# Patient Record
Sex: Female | Born: 1970
Health system: Southern US, Community
[De-identification: ages and names within clinical notes are randomized; demographics above are authoritative.]

## PROBLEM LIST (undated history)

## (undated) DIAGNOSIS — F419 Anxiety disorder, unspecified: Secondary | ICD-10-CM

## (undated) DIAGNOSIS — R7303 Prediabetes: Secondary | ICD-10-CM

## (undated) DIAGNOSIS — G4730 Sleep apnea, unspecified: Secondary | ICD-10-CM

## (undated) DIAGNOSIS — K219 Gastro-esophageal reflux disease without esophagitis: Secondary | ICD-10-CM

## (undated) DIAGNOSIS — M2550 Pain in unspecified joint: Secondary | ICD-10-CM

## (undated) DIAGNOSIS — I10 Essential (primary) hypertension: Secondary | ICD-10-CM

## (undated) DIAGNOSIS — M1990 Unspecified osteoarthritis, unspecified site: Secondary | ICD-10-CM

## (undated) DIAGNOSIS — Z973 Presence of spectacles and contact lenses: Secondary | ICD-10-CM

## (undated) DIAGNOSIS — M549 Dorsalgia, unspecified: Secondary | ICD-10-CM

## (undated) DIAGNOSIS — K5900 Constipation, unspecified: Secondary | ICD-10-CM

## (undated) DIAGNOSIS — F32A Depression, unspecified: Secondary | ICD-10-CM

## (undated) DIAGNOSIS — E669 Obesity, unspecified: Secondary | ICD-10-CM

## (undated) DIAGNOSIS — K589 Irritable bowel syndrome without diarrhea: Secondary | ICD-10-CM

## (undated) DIAGNOSIS — R600 Localized edema: Secondary | ICD-10-CM

## (undated) DIAGNOSIS — F909 Attention-deficit hyperactivity disorder, unspecified type: Secondary | ICD-10-CM

## (undated) HISTORY — DX: Gastro-esophageal reflux disease without esophagitis: K21.9

## (undated) HISTORY — DX: Localized edema: R60.0

## (undated) HISTORY — DX: Irritable bowel syndrome, unspecified: K58.9

## (undated) HISTORY — DX: Anxiety disorder, unspecified: F41.9

## (undated) HISTORY — DX: Essential (primary) hypertension: I10

## (undated) HISTORY — DX: Obesity, unspecified: E66.9

## (undated) HISTORY — PX: OVARIAN CYST REMOVAL: SHX89

## (undated) HISTORY — DX: Pain in unspecified joint: M25.50

## (undated) HISTORY — DX: Attention-deficit hyperactivity disorder, unspecified type: F90.9

## (undated) HISTORY — DX: Dorsalgia, unspecified: M54.9

## (undated) HISTORY — DX: Prediabetes: R73.03

## (undated) HISTORY — DX: Constipation, unspecified: K59.00

---
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
## 1989-02-05 HISTORY — PX: OVARIAN CYST REMOVAL: SHX89

## 1998-05-19 ENCOUNTER — Other Ambulatory Visit: Admission: RE | Admit: 1998-05-19 | Discharge: 1998-05-19 | Payer: Self-pay | Admitting: Obstetrics & Gynecology

## 1998-06-23 ENCOUNTER — Other Ambulatory Visit: Admission: RE | Admit: 1998-06-23 | Discharge: 1998-06-23 | Payer: Self-pay | Admitting: *Deleted

## 2001-07-08 ENCOUNTER — Other Ambulatory Visit: Admission: RE | Admit: 2001-07-08 | Discharge: 2001-07-08 | Payer: Self-pay | Admitting: Obstetrics and Gynecology

## 2001-07-18 ENCOUNTER — Ambulatory Visit (HOSPITAL_COMMUNITY): Admission: RE | Admit: 2001-07-18 | Discharge: 2001-07-18 | Payer: Self-pay | Admitting: Obstetrics and Gynecology

## 2001-07-18 ENCOUNTER — Encounter: Payer: Self-pay | Admitting: Obstetrics and Gynecology

## 2003-07-22 ENCOUNTER — Other Ambulatory Visit: Admission: RE | Admit: 2003-07-22 | Discharge: 2003-07-22 | Payer: Self-pay | Admitting: Internal Medicine

## 2003-08-12 ENCOUNTER — Emergency Department (HOSPITAL_COMMUNITY): Admission: EM | Admit: 2003-08-12 | Discharge: 2003-08-12 | Payer: Self-pay | Admitting: Family Medicine

## 2006-02-05 HISTORY — PX: TUBAL LIGATION: SHX77

## 2007-07-11 ENCOUNTER — Ambulatory Visit (HOSPITAL_COMMUNITY): Admission: RE | Admit: 2007-07-11 | Discharge: 2007-07-11 | Payer: Self-pay | Admitting: Obstetrics and Gynecology

## 2007-07-20 ENCOUNTER — Encounter: Admission: RE | Admit: 2007-07-20 | Discharge: 2007-07-20 | Payer: Self-pay | Admitting: Obstetrics and Gynecology

## 2010-03-14 ENCOUNTER — Other Ambulatory Visit: Payer: Self-pay | Admitting: Internal Medicine

## 2010-03-14 DIAGNOSIS — R160 Hepatomegaly, not elsewhere classified: Secondary | ICD-10-CM

## 2010-03-17 ENCOUNTER — Ambulatory Visit
Admission: RE | Admit: 2010-03-17 | Discharge: 2010-03-17 | Disposition: A | Discharge status: Home / Self Care | Payer: 59 | Source: Ambulatory Visit | Attending: Internal Medicine | Admitting: Internal Medicine

## 2010-03-17 DIAGNOSIS — R160 Hepatomegaly, not elsewhere classified: Secondary | ICD-10-CM

## 2010-06-20 NOTE — Op Note (Signed)
NAME:  Rochon, [place]             ACCOUNT NO.:  [id number]   MEDICAL RECORD NO.:  [id number]          PATIENT TYPE:  AMB   LOCATION:  SDC                           FACILITY:  [facility]   PHYSICIAN:  Maxie Better, M.D.DATE OF BIRTH:  [birth year]   DATE OF PROCEDURE:  07/11/2007  DATE OF DISCHARGE:                               OPERATIVE REPORT   PREOPERATIVE DIAGNOSIS:  Desires sterilization.   POSTOPERATIVE DIAGNOSES:  1. Desires sterilization.  2. Liver mass.   PROCEDURE:  Laparoscopic tubal ligation with bipolar cautery.   ANESTHESIA:  General.   SURGEON:  Maxie Better, MD   ASSISTANT:  None.   PROCEDURE:  Under adequate general anesthesia, the patient was placed in  the dorsal lithotomy position.  She was sterilely prepped and draped in  usual fashion.  Bladder was catheterized for moderate amount of urine.  Examination under anesthesia revealed a slightly irregular, enlarged  uterus.  No adnexal masses could be appreciated.  The patient was then  turned to the abdomen.  Marcaine 0.25% was injected infraumbilical.  A  small infraumbilical vertical incision was then made.  A Veress needle  was introduced and to be adjusted after testing with normal saline.  Ultimately, the needle was placed and testing was appropriate.  Carbon  dioxide was then insufflated, opening pressure at that point was 3, and  2.5 L of CO2 was insufflated.  The Veress needle was then removed.  A  disposable 10-mm trocar was introduced in the abdomen without incident.  A lighted video laparoscope was then placed through that port.  On  inspection, the bowels were mildly hyperemic and distended.  There was  some bleeding noted, which appeared to have been coming from the  umbilical site and not on the organs of the abdomen.  Panoramic  inspection was then done and at that time, the gallbladder looked a  little bit distended.  The right liver lobe had a surface mass, unsure  whether or not this  was a hemangioma.  The pictures were taken.  The  appendix was noted to be normal elongated.  Uterus was appeared to be  bulky, irregular.  Both tubes and ovaries were normal.  The posterior  cul-de-sac without any evidence of any endometriosis.  A second incision  was then made suprapubically.  A 5-mm port was placed under direct  visualization, and both midportion of both fallopian tubes were then  cauterized.  When that was felt to be satisfactory, re-inspection of the  incision site infraumbilical was done including pulling back up on the  camera to look and see if there was any active bleeding, and there was  none noted.  The abdomen was then deflated.  The suprapubic site was  removed under direct visualization.  The infraumbilical site was then  removed under direct visualization and at that point, it was noted to be  there was a bleeder in the subcutaneous area, not actively filled, but  clearly that was where the bleeding had arisen.  The fascia was then  identified after the infraumbilical port was removed, and 0 Vicryl of  figure-of-eight  suture was then placed.  The subcutaneous area was  inspected and no bleeders noted.  The suprapubic site was closed with  Dermabond.  The  infraumbilical site was closed with subcuticular 4-0 Vicryl suture.  The  instruments that were in the vagina were removed.  Specimen was none.  Estimated blood loss was minimal.  Complication was none.  The patient  tolerated the procedure well, and was transferred to recovery in stable  condition.      Maxie Better, M.D.  Electronically Signed     [place]/MEDQ  D:  07/11/2007  T:  07/12/2007  Job:  [id number]

## 2010-08-08 ENCOUNTER — Inpatient Hospital Stay (INDEPENDENT_AMBULATORY_CARE_PROVIDER_SITE_OTHER)
Admission: RE | Admit: 2010-08-08 | Discharge: 2010-08-08 | Disposition: A | Discharge status: Home / Self Care | Payer: 59 | Source: Ambulatory Visit | Attending: Family Medicine | Admitting: Family Medicine

## 2010-08-08 DIAGNOSIS — T148XXA Other injury of unspecified body region, initial encounter: Secondary | ICD-10-CM

## 2010-08-17 ENCOUNTER — Inpatient Hospital Stay (HOSPITAL_COMMUNITY)
Admission: RE | Admit: 2010-08-17 | Discharge: 2010-08-17 | Disposition: A | Discharge status: Home / Self Care | Payer: 59 | Source: Ambulatory Visit | Attending: Family Medicine | Admitting: Family Medicine

## 2010-08-20 ENCOUNTER — Inpatient Hospital Stay (HOSPITAL_COMMUNITY)
Admission: RE | Admit: 2010-08-20 | Discharge: 2010-08-20 | Disposition: A | Discharge status: Home / Self Care | Payer: 59 | Source: Ambulatory Visit | Attending: Emergency Medicine | Admitting: Emergency Medicine

## 2010-11-02 LAB — CBC
HCT: 37.9
Hemoglobin: 13.1
MCHC: 34.4
MCV: 90.8
Platelets: 228
RBC: 4.18
RDW: 13.9
WBC: 4.8

## 2010-11-02 LAB — PREGNANCY, URINE: Preg Test, Ur: NEGATIVE

## 2010-12-25 ENCOUNTER — Encounter (HOSPITAL_COMMUNITY): Payer: Self-pay

## 2010-12-25 ENCOUNTER — Emergency Department (INDEPENDENT_AMBULATORY_CARE_PROVIDER_SITE_OTHER)
Admission: EM | Admit: 2010-12-25 | Discharge: 2010-12-25 | Disposition: A | Discharge status: Home / Self Care | Payer: 59 | Source: Home / Self Care

## 2010-12-25 DIAGNOSIS — J309 Allergic rhinitis, unspecified: Secondary | ICD-10-CM

## 2010-12-25 DIAGNOSIS — J302 Other seasonal allergic rhinitis: Secondary | ICD-10-CM

## 2010-12-25 DIAGNOSIS — J45909 Unspecified asthma, uncomplicated: Secondary | ICD-10-CM

## 2010-12-25 MED ORDER — FEXOFENADINE-PSEUDOEPHED ER 60-120 MG PO TB12: 1.0000 | ORAL_TABLET | Freq: Two times a day (BID) | ORAL | Status: AC

## 2010-12-25 MED ORDER — ALBUTEROL SULFATE HFA 108 (90 BASE) MCG/ACT IN AERS: 1.0000 | INHALATION_SPRAY | Freq: Four times a day (QID) | RESPIRATORY_TRACT | Status: DC | PRN

## 2010-12-25 MED ORDER — PREDNISONE 20 MG PO TABS: ORAL_TABLET | ORAL | Status: AC

## 2010-12-25 MED ORDER — HYDROCODONE-ACETAMINOPHEN 7.5-325 MG/15ML PO SOLN: 15.0000 mL | Freq: Three times a day (TID) | ORAL | Status: AC | PRN

## 2010-12-25 NOTE — ED Notes (Signed)
Started 3 weeks ago.  Has nonproductive cough.  Denies fevers.  Worse in evenings.  C/o chest pain from coughing so much. In addition had nosebleed Saturday.

## 2010-12-25 NOTE — ED Provider Notes (Signed)
History     CSN: [id number] Arrival date & time: 12/25/2010  8:57 AM   First MD Initiated Contact with Patient 12/25/10 [phone]      Chief Complaint  Patient presents with  . Cough    Started 3 weeks ago.  Has nonproductive cough.  Denies fevers.  Worse in evenings.  C/o chest pain from coughing so much.     (Consider location/radiation/quality/duration/timing/severity/associated sxs/prior treatment) Patient is a 40 y.o. female presenting with cough. The history is provided by the patient.  Cough This is a recurrent problem. The current episode started more than 1 week ago (3 weeks). The problem occurs constantly (worse at night). The problem has not changed since onset.The cough is non-productive. There has been no fever. Associated symptoms include rhinorrhea and wheezing. Pertinent negatives include no chest pain, no chills, no sweats, no weight loss, no ear congestion, no ear pain, no headaches, no sore throat, no myalgias and no eye redness. Associated symptoms comments: Nasal congestion and rhinorrhea. Upper central chest pain, wheezing and shortness of breath only during coughing spells.. She has tried cough syrup for the symptoms. The treatment provided mild relief. She is not a smoker. Her past medical history is significant for asthma. Her past medical history does not include COPD.    Past Medical History  Diagnosis Date  . Asthma     Past Surgical History  Procedure Date  . Cesarean section 1998  . Tubal ligation 2008    Family History  Problem Relation Age of Onset  . Asthma Other   . Heart failure Other   . Hypertension Other     History  Substance Use Topics  . Smoking status: Never Smoker   . Smokeless tobacco: Never Used  . Alcohol Use: 0.6 oz/week    1 Glasses of wine per week    OB History    Grav Para Term Preterm Abortions TAB SAB Ect Mult Living                  Review of Systems  Constitutional: Negative.  Negative for chills and weight loss.   HENT: Positive for nosebleeds, congestion, rhinorrhea, sneezing and postnasal drip. Negative for ear pain, sore throat and trouble swallowing.   Eyes: Negative for redness.  Respiratory: Positive for cough and wheezing.   Cardiovascular: Negative for chest pain, palpitations and leg swelling.  Musculoskeletal: Negative for myalgias.  Neurological: Negative for headaches.    Allergies  Review of patient's allergies indicates no known allergies.  Home Medications   Current Outpatient Rx  Name Route Sig Dispense Refill  . LISDEXAMFETAMINE DIMESYLATE 50 MG PO CAPS Oral Take 50 mg by mouth every morning.      . ALBUTEROL SULFATE HFA 108 (90 BASE) MCG/ACT IN AERS Inhalation Inhale 1-2 puffs into the lungs every 6 (six) hours as needed for wheezing. 1 Inhaler 0  . FEXOFENADINE-PSEUDOEPHEDRINE 60-120 MG PO TB12 Oral Take 1 tablet by mouth every 12 (twelve) hours. 30 tablet 0  . HYDROCODONE-ACETAMINOPHEN 7.5-325 MG/15ML PO SOLN Oral Take 15 mLs by mouth every 8 (eight) hours as needed for pain. 120 mL 0  . PREDNISONE 20 MG PO TABS  Take 2 tabs po daily for 5 days. 10 tablet no    BP 135/67  Pulse 60  Temp(Src) 98.4 F (36.9 C) (Oral)  Resp 16  SpO2 100%  LMP 12/17/2010  Physical Exam  Nursing note and vitals reviewed. Constitutional: She is oriented to person, place, and time. She appears  well-developed and well-nourished. No distress.  HENT:  Right Ear: External ear normal.  Left Ear: External ear normal.  Mouth/Throat: No oropharyngeal exudate.       Pharyngeal erythema no exudates. No uvula deviation. No trismus. Nasal congestion with significant redness and swelling of nasal turbinates. No active bleeding. Impress clear fluid behind right TM.  Eyes: Conjunctivae and EOM are normal. Pupils are equal, round, and reactive to light.  Cardiovascular: Normal heart sounds.        No low extremity edema  Pulmonary/Chest: Breath sounds normal. No respiratory distress. She has no  wheezes. She has no rales.  Lymphadenopathy:    She has no cervical adenopathy.  Neurological: She is alert and oriented to person, place, and time.  Skin: No rash noted.    ED Course  Procedures (including critical care time)  Labs Reviewed - No data to display No results found.   1. Seasonal allergies   2. Asthma       MDM  Likely seasonal allergies triggering asthma symptoms. Treated with prednisone, albuterol and antihistamine-decongestant.        Sharin Grave, MD 12/26/10 1424

## 2012-03-23 ENCOUNTER — Encounter (HOSPITAL_COMMUNITY): Payer: Self-pay | Admitting: *Deleted

## 2012-03-23 ENCOUNTER — Emergency Department (HOSPITAL_COMMUNITY)
Admission: EM | Admit: 2012-03-23 | Discharge: 2012-03-23 | Disposition: A | Discharge status: Home / Self Care | Payer: 59 | Source: Home / Self Care | Attending: Emergency Medicine | Admitting: Emergency Medicine

## 2012-03-23 DIAGNOSIS — N3000 Acute cystitis without hematuria: Secondary | ICD-10-CM

## 2012-03-23 LAB — POCT URINALYSIS DIP (DEVICE)
Glucose, UA: 100 mg/dL — AB
Ketones, ur: NEGATIVE mg/dL
Specific Gravity, Urine: 1.01 (ref 1.005–1.030)
Urobilinogen, UA: 0.2 mg/dL (ref 0.0–1.0)

## 2012-03-23 LAB — POCT PREGNANCY, URINE: Preg Test, Ur: NEGATIVE

## 2012-03-23 MED ORDER — CEPHALEXIN 500 MG PO CAPS: 500.0000 mg | ORAL_CAPSULE | Freq: Three times a day (TID) | ORAL | Status: DC

## 2012-03-23 NOTE — ED Notes (Signed)
Patient complains of urinary urgency, burning and itching, nausea x 1 day. Denies bleeding.

## 2012-03-23 NOTE — ED Provider Notes (Signed)
Chief Complaint  Patient presents with  . Urinary Tract Infection    History of Present Illness:   Pamela [name] is a 42 year old female who presents with a two-day history of frequency, urgency, dysuria, nausea, headache, and right and left lower back pain. She denies any fever, chills, vomiting, abdominal pain, pelvic pain, discharge, itching, odor. There's been no blood in the urine. She has had urinary tract infections before, most recently in October. She cannot think of anything that might of brought this on. She is drinking plenty of fluids, urinating regularly, and she does not think that sexual intercourse was a precipitating factor. She has tried some Azo-Standard and got a little bit symptomatic relief. Her last menstrual period was January 20. She has had a bilateral tubal ligation. She is sexually active. Patient denies allergies, medications, or other illness.  Review of Systems:  Other than noted above, the patient denies any of the following symptoms: General:  No fevers, chills, sweats, aches, or fatigue. GI:  No abdominal pain, back pain, nausea, vomiting, diarrhea, or constipation. GU:  No dysuria, frequency, urgency, hematuria, or incontinence. GYN:  No discharge, itching, vulvar pain or lesions, pelvic pain, or abnormal vaginal bleeding.  PMFSH:  Past medical history, family history, social history, meds, and allergies were reviewed.  Physical Exam:   Vital signs:  BP 141/87  Pulse 68  Temp(Src) 98.1 F (36.7 C) (Oral)  Resp 16  SpO2 96%  LMP 02/27/2012 Gen:  Alert, oriented, in no distress. Lungs:  Clear to auscultation, no wheezes, rales or rhonchi. Heart:  Regular rhythm, no gallop or murmer. Abdomen:  Flat and soft. There was slight suprapubic pain to palpation.  No guarding, or rebound.  No hepato-splenomegaly or mass.  Bowel sounds were normally active.  No hernia. Back:  No CVA tenderness.  Skin:  Clear, warm and dry.  Labs:   Results for orders placed  during the hospital encounter of 03/23/12  POCT URINALYSIS DIP (DEVICE)      Result Value Range   Glucose, UA 100 (*) NEGATIVE mg/dL   Bilirubin Urine NEGATIVE  NEGATIVE   Ketones, ur NEGATIVE  NEGATIVE mg/dL   Specific Gravity, Urine 1.010  1.005 - 1.030   Hgb urine dipstick SMALL (*) NEGATIVE   pH 5.5  5.0 - 8.0   Protein, ur NEGATIVE  NEGATIVE mg/dL   Urobilinogen, UA 0.2  0.0 - 1.0 mg/dL   Nitrite POSITIVE (*) NEGATIVE   Leukocytes, UA TRACE (*) NEGATIVE  POCT PREGNANCY, URINE      Result Value Range   Preg Test, Ur NEGATIVE  NEGATIVE     Other Labs Obtained at Urgent Care Center:  A urine culture was obtained.  Results are pending at this time and we will call about any positive results.  Assessment: The encounter diagnosis was Acute cystitis.   Plan:   1.  The following meds were prescribed:   Discharge Medication List as of 03/23/2012 12:06 PM    START taking these medications   Details  cephALEXin (KEFLEX) 500 MG capsule Take 1 capsule (500 mg total) by mouth 3 (three) times daily., Starting 03/23/2012, Until Discontinued, Normal       2.  The patient was instructed in symptomatic care and handouts were given. 3.  The patient was told to return if becoming worse in any way, if no better in 3 or 4 days, and given some red flag symptoms that would indicate earlier return. 4.  The patient was told  to avoid intercourse for 10 days, get extra fluids, and return for a follow up with her primary care doctor at the completion of treatment for a repeat UA and culture.     Reuben Likes, MD 03/23/12 [phone]

## 2012-03-25 LAB — URINE CULTURE: Colony Count: 70000

## 2012-03-27 NOTE — ED Notes (Signed)
Urine culture: 70,000 colonies E. Coli  Pt. adequately treated with keflex. Vassie Moselle 03/27/2012

## 2012-08-06 ENCOUNTER — Encounter (HOSPITAL_COMMUNITY): Payer: Self-pay | Admitting: Emergency Medicine

## 2012-08-06 ENCOUNTER — Emergency Department (HOSPITAL_COMMUNITY)
Admission: EM | Admit: 2012-08-06 | Discharge: 2012-08-06 | Disposition: A | Discharge status: Home / Self Care | Payer: 59 | Attending: Emergency Medicine | Admitting: Emergency Medicine

## 2012-08-06 DIAGNOSIS — J45909 Unspecified asthma, uncomplicated: Secondary | ICD-10-CM | POA: Insufficient documentation

## 2012-08-06 DIAGNOSIS — H579 Unspecified disorder of eye and adnexa: Secondary | ICD-10-CM

## 2012-08-06 DIAGNOSIS — H539 Unspecified visual disturbance: Secondary | ICD-10-CM | POA: Insufficient documentation

## 2012-08-06 MED ORDER — TETRACAINE HCL 0.5 % OP SOLN
2.0000 [drp] | Freq: Once | OPHTHALMIC | Status: AC
  Administered 2012-08-06: 2 [drp] via OPHTHALMIC
  Filled 2012-08-06: qty 2

## 2012-08-06 NOTE — ED Provider Notes (Signed)
History    CSN: [id number] Arrival date & time 08/06/12  6578  First MD Initiated Contact with Patient 08/06/12 [phone]     Chief Complaint  Patient presents with  . Blurred Vision   (Consider location/radiation/quality/duration/timing/severity/associated sxs/prior Treatment) HPI  Pamela [name] is a 42 y.o.female with a significant PMH of asthma presents to the ER with complaints of left eye vision appearing darker than the right eye. She noticed it when she woke up this morning. No injury, no loss of vision, no scratch, no drainage or pain. No sensitivity to light. The symptoms have resolved while she has been waiting in the waiting room and she no longer has any symptoms.   Past Medical History  Diagnosis Date  . Asthma    Past Surgical History  Procedure Laterality Date  . Cesarean section  1998  . Tubal ligation  2008   Family History  Problem Relation Age of Onset  . Asthma Other   . Heart failure Other   . Hypertension Other    History  Substance Use Topics  . Smoking status: Never Smoker   . Smokeless tobacco: Never Used  . Alcohol Use: 0.6 oz/week    1 Glasses of wine per week   OB History   Grav Para Term Preterm Abortions TAB SAB Ect Mult Living                 Review of Systems  Eyes: Positive for visual disturbance.  All other systems reviewed and are negative.    Allergies  Review of patient's allergies indicates no known allergies.  Home Medications   Current Outpatient Rx  Name  Route  Sig  Dispense  Refill  . EXPIRED: albuterol (PROVENTIL HFA;VENTOLIN HFA) 108 (90 BASE) MCG/ACT inhaler   Inhalation   Inhale 1-2 puffs into the lungs every 6 (six) hours as needed for wheezing.   1 Inhaler   0    BP 153/85  Pulse 54  Temp(Src) 98.2 F (36.8 C) (Oral)  Resp 18  SpO2 98%  LMP 07/29/2012 Physical Exam  Nursing note and vitals reviewed. Constitutional: She appears well-developed and well-nourished. No distress.  HENT:  Head:  Normocephalic and atraumatic.  Eyes: Conjunctivae, EOM and lids are normal. Pupils are equal, round, and reactive to light.  Fundoscopic exam:      The left eye shows no hemorrhage. The left eye shows no red reflex.  Slit lamp exam:      The left eye shows no corneal abrasion and no foreign body.  Pressures are symmetrical bilaterally  Neck: Normal range of motion. Neck supple.  Cardiovascular: Normal rate and regular rhythm.   Pulmonary/Chest: Effort normal.  Abdominal: Soft.  Neurological: She is alert.  Skin: Skin is warm and dry.    ED Course  Procedures (including critical care time) Labs Reviewed - No data to display No results found. 1. Left eye complaint     MDM    Patients symptoms have resolved. Unsure of what the etiology is of her symptoms from this morning. Discussed following up with Ophthalmology today. Gave referral and will have her call as soon as the office opens.  42 y.o.Pamela [name]'s evaluation in the Emergency Department is complete. It has been determined that no acute conditions requiring further emergency intervention are present at this time. The patient/guardian have been advised of the diagnosis and plan. We have discussed signs and symptoms that warrant return to the ED, such as changes or worsening  in symptoms.  Vital signs are stable at discharge. Filed Vitals:   08/06/12 0600  BP: 153/85  Pulse: 54  Temp:   Resp:     Patient/guardian has voiced understanding and agreed to follow-up with the PCP or specialist.   Dorthula Matas, PA-C 08/06/12 [phone]

## 2012-08-06 NOTE — ED Notes (Signed)
PT. REPORTS LEFT EYE BLURRED VISION THIS EVENING , DENIES PAIN OR INJURY / NO DRAINAGE .

## 2012-08-06 NOTE — ED Notes (Signed)
Patient complains of "shading in L eye.  I woke up this morning and couldn't see out of the eye, but then there was just some weird shading".  Mostly resolved at this time.  Patient is concerned for "detached retina".

## 2012-08-07 NOTE — ED Provider Notes (Signed)
Medical screening examination/treatment/procedure(s) were performed by non-physician practitioner and as supervising physician I was immediately available for consultation/collaboration.   Laray Anger, DO 08/07/12 2005

## 2013-07-21 ENCOUNTER — Other Ambulatory Visit: Payer: Self-pay | Admitting: Gastroenterology

## 2013-07-21 DIAGNOSIS — R9389 Abnormal findings on diagnostic imaging of other specified body structures: Secondary | ICD-10-CM

## 2013-07-22 ENCOUNTER — Other Ambulatory Visit: Payer: Self-pay | Admitting: Gastroenterology

## 2013-07-22 DIAGNOSIS — R935 Abnormal findings on diagnostic imaging of other abdominal regions, including retroperitoneum: Secondary | ICD-10-CM

## 2013-07-29 ENCOUNTER — Other Ambulatory Visit: Payer: 59

## 2013-07-30 ENCOUNTER — Ambulatory Visit
Admission: RE | Admit: 2013-07-30 | Discharge: 2013-07-30 | Disposition: A | Discharge status: Home / Self Care | Payer: 59 | Source: Ambulatory Visit | Attending: Gastroenterology | Admitting: Gastroenterology

## 2013-07-30 DIAGNOSIS — R935 Abnormal findings on diagnostic imaging of other abdominal regions, including retroperitoneum: Secondary | ICD-10-CM

## 2013-07-30 MED ORDER — GADOBENATE DIMEGLUMINE 529 MG/ML IV SOLN
20.0000 mL | Freq: Once | INTRAVENOUS | Status: AC | PRN
  Administered 2013-07-30: 20 mL via INTRAVENOUS

## 2015-05-20 ENCOUNTER — Ambulatory Visit (HOSPITAL_COMMUNITY)
Admission: EM | Admit: 2015-05-20 | Discharge: 2015-05-20 | Disposition: A | Discharge status: Home / Self Care | Payer: 59 | Attending: Emergency Medicine | Admitting: Emergency Medicine

## 2015-05-20 ENCOUNTER — Ambulatory Visit (HOSPITAL_COMMUNITY): Payer: 59

## 2015-05-20 ENCOUNTER — Encounter (HOSPITAL_COMMUNITY): Payer: Self-pay | Admitting: *Deleted

## 2015-05-20 DIAGNOSIS — R05 Cough: Secondary | ICD-10-CM

## 2015-05-20 DIAGNOSIS — J40 Bronchitis, not specified as acute or chronic: Secondary | ICD-10-CM

## 2015-05-20 DIAGNOSIS — R059 Cough, unspecified: Secondary | ICD-10-CM

## 2015-05-20 MED ORDER — IPRATROPIUM-ALBUTEROL 0.5-2.5 (3) MG/3ML IN SOLN
RESPIRATORY_TRACT | Status: AC
  Filled 2015-05-20: qty 3

## 2015-05-20 MED ORDER — PREDNISONE 10 MG (48) PO TBPK: ORAL_TABLET | Freq: Every day | ORAL | Status: DC

## 2015-05-20 MED ORDER — IPRATROPIUM-ALBUTEROL 0.5-2.5 (3) MG/3ML IN SOLN
3.0000 mL | Freq: Once | RESPIRATORY_TRACT | Status: AC
  Administered 2015-05-20: 3 mL via RESPIRATORY_TRACT

## 2015-05-20 MED ORDER — HYDROCODONE-HOMATROPINE 5-1.5 MG/5ML PO SYRP: 5.0000 mL | ORAL_SOLUTION | Freq: Four times a day (QID) | ORAL | Status: DC | PRN

## 2015-05-20 MED ORDER — ALBUTEROL SULFATE HFA 108 (90 BASE) MCG/ACT IN AERS: 2.0000 | INHALATION_SPRAY | Freq: Four times a day (QID) | RESPIRATORY_TRACT | Status: DC | PRN

## 2015-05-20 NOTE — Discharge Instructions (Signed)
You have bronchitis by chest xray which is viral. Use the prescription cough medication when you are not driving it can make you sleepy. Use Delsym during the day. Also Mucinex Max strength is very helpful with 8oz of water. Start the Prednisone in the morning, if you start tonight it might keep you awake. Use the injaler every 6 hours while awake x 2-3 days then prn. Should you worsen then please return here or er. Or ok to f/u with your PCP. Hope you feel better.  Upper Respiratory Infection, Adult Most upper respiratory infections (URIs) are caused by a virus. A URI affects the nose, throat, and upper air passages. The most common type of URI is often called "the common cold." HOME CARE   Take medicines only as told by your doctor.  Gargle warm saltwater or take cough drops to comfort your throat as told by your doctor.  Use a warm mist humidifier or inhale steam from a shower to increase air moisture. This may make it easier to breathe.  Drink enough fluid to keep your pee (urine) clear or pale yellow.  Eat soups and other clear broths.  Have a healthy diet.  Rest as needed.  Go back to work when your fever is gone or your doctor says it is okay.  You may need to stay home longer to avoid giving your URI to others.  You can also wear a face mask and wash your hands often to prevent spread of the virus.  Use your inhaler more if you have asthma.  Do not use any tobacco products, including cigarettes, chewing tobacco, or electronic cigarettes. If you need help quitting, ask your doctor. GET HELP IF:  You are getting worse, not better.  Your symptoms are not helped by medicine.  You have chills.  You are getting more short of breath.  You have brown or red mucus.  You have yellow or brown discharge from your nose.  You have pain in your face, especially when you bend forward.  You have a fever.  You have puffy (swollen) neck glands.  You have pain while  swallowing.  You have white areas in the back of your throat. GET HELP RIGHT AWAY IF:   You have very bad or constant:  Headache.  Ear pain.  Pain in your forehead, behind your eyes, and over your cheekbones (sinus pain).  Chest pain.  You have long-lasting (chronic) lung disease and any of the following:  Wheezing.  Long-lasting cough.  Coughing up blood.  A change in your usual mucus.  You have a stiff neck.  You have changes in your:  Vision.  Hearing.  Thinking.  Mood. MAKE SURE YOU:   Understand these instructions.  Will watch your condition.  Will get help right away if you are not doing well or get worse.   This information is not intended to replace advice given to you by your health care provider. Make sure you discuss any questions you have with your health care provider.   Document Released: 07/11/2007 Document Revised: 06/08/2014 Document Reviewed: 04/29/2013 Elsevier Interactive Patient Education [employer].

## 2015-05-20 NOTE — ED Notes (Signed)
PT   Reports   Of    Symptoms      Tightness  In     Chest          Shortness  Of breath              X  1  Week   She  Got  Better    And  Symptoms  Returned               she  Reports  She  Used  An inhaler that she had  On     Hand       From  A  Previous     Visit

## 2015-05-20 NOTE — ED Provider Notes (Signed)
CSN: [id number]     Arrival date & time 05/20/15  1807 History   First MD Initiated Contact with Patient 05/20/15 1838     Chief Complaint  Patient presents with  . Shortness of Breath   (Consider location/radiation/quality/duration/timing/severity/associated sxs/prior Treatment) HPI Comments: Pamela [name] is a very pleasant 45 yo female who presents with cough and dyspnea. She suffered from a respiratory infection about a week ago, she was feeling better at first then developed some SOB and "tightness" in her upper airway. She also has malaise and a dry cough. No fevers. No upper respiratory complaints.   Patient is a 45 y.o. female presenting with shortness of breath. The history is provided by the patient.  Shortness of Breath Associated symptoms: cough   Associated symptoms: no fever and no wheezing     Past Medical History  Diagnosis Date  . Asthma    Past Surgical History  Procedure Laterality Date  . Cesarean section  1998  . Tubal ligation  2008   Family History  Problem Relation Age of Onset  . Asthma Other   . Heart failure Other   . Hypertension Other    Social History  Substance Use Topics  . Smoking status: Never Smoker   . Smokeless tobacco: Never Used  . Alcohol Use: 0.6 oz/week    1 Glasses of wine per week   OB History    No data available     Review of Systems  Constitutional: Positive for fatigue. Negative for fever and chills.  HENT: Negative.  Negative for postnasal drip, rhinorrhea and sinus pressure.   Respiratory: Positive for cough and shortness of breath. Negative for wheezing.   Allergic/Immunologic: Negative for environmental allergies.       Allergy history as a child  Psychiatric/Behavioral: Negative.     Allergies  Review of patient's allergies indicates no known allergies.  Home Medications   Prior to Admission medications   Medication Sig Start Date End Date Taking? Authorizing Provider  albuterol (PROVENTIL HFA;VENTOLIN  HFA) 108 (90 Base) MCG/ACT inhaler Inhale 2 puffs into the lungs every 6 (six) hours as needed for wheezing or shortness of breath. 05/20/15   Bjorn Pippin, PA-C  HYDROcodone-homatropine (HYCODAN) 5-1.5 MG/5ML syrup Take 5 mLs by mouth every 6 (six) hours as needed for cough. 05/20/15   Bjorn Pippin, PA-C  predniSONE (STERAPRED UNI-PAK 48 TAB) 10 MG (48) TBPK tablet Take by mouth daily. Take daily as directed 05/20/15   Bjorn Pippin, PA-C   Meds Ordered and Administered this Visit   Medications  ipratropium-albuterol (DUONEB) 0.5-2.5 (3) MG/3ML nebulizer solution 3 mL (not administered)    BP 167/96 mmHg  Pulse 67  Temp(Src) 98.1 F (36.7 C) (Oral)  SpO2 99%  LMP 05/16/2015 No data found.   Physical Exam  Constitutional: She is oriented to person, place, and time. She appears well-developed and well-nourished. No distress.  HENT:  Head: Normocephalic and atraumatic.  Mouth/Throat: Oropharynx is clear and moist.  Neck: Normal range of motion.  Cardiovascular: Normal rate and regular rhythm.   Pulmonary/Chest: Effort normal. No respiratory distress. She has wheezes. She has no rales. She exhibits no tenderness.  Few wheeze in the right mid lung, mild course BS same region without crackles  Lymphadenopathy:    She has no cervical adenopathy.  Neurological: She is alert and oriented to person, place, and time.  Skin: Skin is warm and dry. She is not diaphoretic.  Psychiatric: Her behavior is normal.  Nursing note and vitals reviewed.   ED Course  Procedures (including critical care time)  Labs Review Labs Reviewed - No data to display  Imaging Review Dg Chest 2 View  05/20/2015  CLINICAL DATA:  Chest tightness and cough for the past week. EXAM: CHEST  2 VIEW COMPARISON:  None. FINDINGS: Normal sized heart. Clear lungs. Minimal central peribronchial thickening. Mild thoracic spine degenerative changes. IMPRESSION: Minimal bronchitic changes. Electronically Signed   By:  Claudie Revering M.D.   On: 05/20/2015 19:27     Visual Acuity Review  Right Eye Distance:   Left Eye Distance:   Bilateral Distance:    Right Eye Near:   Left Eye Near:    Bilateral Near:         MDM   1. Bronchitis   2. Cough    1. Treat with supportive care to include inhaler, prednisone pack and cough suppressant. Rest and fluids. No indication for an abx at this time. F/U if symptoms persist or worsen.     Bjorn Pippin, PA-C 05/20/15 1942

## 2016-03-12 DIAGNOSIS — K581 Irritable bowel syndrome with constipation: Secondary | ICD-10-CM | POA: Diagnosis not present

## 2016-03-12 DIAGNOSIS — R1084 Generalized abdominal pain: Secondary | ICD-10-CM | POA: Diagnosis not present

## 2016-03-12 DIAGNOSIS — K602 Anal fissure, unspecified: Secondary | ICD-10-CM | POA: Diagnosis not present

## 2016-03-31 DIAGNOSIS — J019 Acute sinusitis, unspecified: Secondary | ICD-10-CM | POA: Diagnosis not present

## 2016-04-12 DIAGNOSIS — K5904 Chronic idiopathic constipation: Secondary | ICD-10-CM | POA: Diagnosis not present

## 2016-04-12 DIAGNOSIS — K219 Gastro-esophageal reflux disease without esophagitis: Secondary | ICD-10-CM | POA: Diagnosis not present

## 2016-05-11 DIAGNOSIS — L03119 Cellulitis of unspecified part of limb: Secondary | ICD-10-CM | POA: Diagnosis not present

## 2016-05-23 DIAGNOSIS — L02416 Cutaneous abscess of left lower limb: Secondary | ICD-10-CM | POA: Diagnosis not present

## 2016-07-07 DIAGNOSIS — J0100 Acute maxillary sinusitis, unspecified: Secondary | ICD-10-CM | POA: Diagnosis not present

## 2016-08-23 ENCOUNTER — Ambulatory Visit (INDEPENDENT_AMBULATORY_CARE_PROVIDER_SITE_OTHER): Payer: 59 | Admitting: Allergy & Immunology

## 2016-08-23 ENCOUNTER — Encounter: Payer: Self-pay | Admitting: Allergy & Immunology

## 2016-08-23 VITALS — BP 130/78 | HR 70 | Temp 98.5°F | Resp 18 | Ht 65.0 in | Wt 303.2 lb

## 2016-08-23 DIAGNOSIS — T781XXD Other adverse food reactions, not elsewhere classified, subsequent encounter: Secondary | ICD-10-CM

## 2016-08-23 DIAGNOSIS — J4530 Mild persistent asthma, uncomplicated: Secondary | ICD-10-CM | POA: Insufficient documentation

## 2016-08-23 DIAGNOSIS — J3089 Other allergic rhinitis: Secondary | ICD-10-CM | POA: Insufficient documentation

## 2016-08-23 DIAGNOSIS — J309 Allergic rhinitis, unspecified: Secondary | ICD-10-CM | POA: Insufficient documentation

## 2016-08-23 MED ORDER — MONTELUKAST SODIUM 10 MG PO TABS: 10.0000 mg | ORAL_TABLET | Freq: Every day | ORAL | 5 refills | Status: DC

## 2016-08-23 MED ORDER — ALBUTEROL SULFATE HFA 108 (90 BASE) MCG/ACT IN AERS: 2.0000 | INHALATION_SPRAY | Freq: Four times a day (QID) | RESPIRATORY_TRACT | 5 refills | Status: DC | PRN

## 2016-08-23 MED ORDER — EPINEPHRINE 0.3 MG/0.3ML IJ SOAJ: 0.3000 mg | Freq: Once | INTRAMUSCULAR | 2 refills | Status: AC

## 2016-08-23 NOTE — Patient Instructions (Addendum)
1. Mild persistent asthma, uncomplicated - Lung testing was normal today.  - We will start you on Singulair (montelukast), which can help with the shortness of breath as well as the nasal symptoms. - Daily controller medication(s): Singulair (montelukast) 10mg  nightly - Rescue medications: Ventolin 4 puffs every 4-6 hours as needed - Asthma control goals:  * Full participation in all desired activities (may need albuterol before activity) * Albuterol use two time or less a week on average (not counting use with activity) * Cough interfering with sleep two time or less a month * Oral steroids no more than once a year * No hospitalizations  2. Perennial allergic rhinitis - Testing today showed: molds, cat and dog - Avoidance measures provided. - Continue with Flonase (fluticasone) two sprays per nostril daily - Start Xyzal (levocetirizine) 5mg  tablet once daily - You can use an extra dose of the antihistamine, if needed, for breakthrough symptoms.  - Consider nasal saline rinses 1-2 times daily to remove allergens from the nasal cavities as well as help with mucous clearance (this is especially helpful to do before the nasal sprays are given) - Allergy shot consent signed today.  - Make an appointment in two weeks for the first injection.  - We will send in a prescription for AuviQ epinephrine injector.  - Allergy shots "re-train" the immune system to ignore environmental allergens and decrease the resulting immune response to those allergens (sneezing, itchy watery eyes, runny nose, nasal congestion, etc).    3. Return in about 6 months (around 02/23/2017).   Please inform us of any Emergency Department visits, hospitalizations, or changes in symptoms. Call us before going to the ED for breathing or allergy symptoms since we might be able to fit you in for a sick visit. Feel free to contact us anytime with any questions, problems, or concerns.  It was a pleasure to meet you today! Happy  summer!   Websites that have reliable patient information: 1. American Academy of Asthma, Allergy, and Immunology: www.aaaai.org 2. Food Allergy Research and Education (FARE): foodallergy.org 3. Mothers of Asthmatics: http://www.asthmacommunitynetwork.org 4. American College of Allergy, Asthma, and Immunology: www.acaai.org   Control of Mold Allergen  Mold and fungi can grow on a variety of surfaces provided certain temperature and moisture conditions exist.  Outdoor molds grow on plants, decaying vegetation and soil.  The major outdoor mold, Alternaria and Cladosporium, are found in very high numbers during hot and dry conditions.  Generally, a late Summer - Fall peak is seen for common outdoor fungal spores.  Rain will temporarily lower outdoor mold spore count, but counts rise rapidly when the rainy period ends.  The most important indoor molds are Aspergillus and Penicillium.  Dark, humid and poorly ventilated basements are ideal sites for mold growth.  The next most common sites of mold growth are the bathroom and the kitchen.  Outdoor [employer] 1. Use air conditioning and keep windows closed 2. Avoid exposure to decaying vegetation. 3. Avoid leaf raking. 4. Avoid grain handling. 5. Consider wearing a face mask if working in moldy areas.  Indoor Mold Control 1. Maintain humidity below 50%. 2. Clean washable surfaces with 5% bleach solution. 3. Remove sources e.g. contaminated carpets.  Control of Dog or Cat Allergen  Avoidance is the best way to manage a dog or cat allergy. If you have a dog or cat and are allergic to dog or cats, consider removing the dog or cat from the home. If you have a dog  or cat but don't want to find it a new home, or if your family wants a pet even though someone in the household is allergic, here are some strategies that may help keep symptoms at bay:  1. Keep the pet out of your bedroom and restrict it to only a few rooms. Be advised that keeping the  dog or cat in only one room will not limit the allergens to that room. 2. Don't pet, hug or kiss the dog or cat; if you do, wash your hands with soap and water. 3. High-efficiency particulate air (HEPA) cleaners run continuously in a bedroom or living room can reduce allergen levels over time. 4. Regular use of a high-efficiency vacuum cleaner or a central vacuum can reduce allergen levels. 5. Giving your dog or cat a bath at least once a week can reduce airborne allergen.

## 2016-08-23 NOTE — Progress Notes (Signed)
NEW PATIENT  Date of Service/Encounter:  08/23/16  Referring provider: Lavone Orn, MD   Assessment:   Mild persistent asthma, uncomplicated  Perennial allergic rhinitis (cat, dog, indoor molds)  Adverse food reaction (soy, milk) - more consistent with intolerance rather than allergy    Asthma Reportables:  Severity: mild persistent  Risk: low Control: well controlled     Plan/Recommendations:   1. Mild persistent asthma, uncomplicated - Lung testing was normal today.  - We will start you on Singulair (montelukast), which can help with the shortness of breath as well as the nasal symptoms. - Daily controller medication(s): Singulair (montelukast) 10mg  nightly - Rescue medications: Ventolin 4 puffs every 4-6 hours as needed - Asthma control goals:  * Full participation in all desired activities (may need albuterol before activity) * Albuterol use two time or less a week on average (not counting use with activity) * Cough interfering with sleep two time or less a month * Oral steroids no more than once a year * No hospitalizations  2. Perennial allergic rhinitis - Testing today showed: molds, cat and dog - Avoidance measures provided. - The testing results do fit her symptom course, as her symptoms are worsened when school is in session and improve during school vacations.  - Continue with Flonase (fluticasone) two sprays per nostril daily - Start Xyzal (levocetirizine) 5mg  tablet once daily - You can use an extra dose of the antihistamine, if needed, for breakthrough symptoms.  - Consider nasal saline rinses 1-2 times daily to remove allergens from the nasal cavities as well as help with mucous clearance (this is especially helpful to do before the nasal sprays are given) - Allergy shot consent signed today, and she will give [place] a call after contacting her insurance company about co-payments.  - Make an appointment in two weeks for the first injection.   - We will  send in a prescription for AuviQ epinephrine injector.  - Allergy shots "re-train" the immune system to ignore environmental allergens and decrease the resulting immune response to those allergens (sneezing, itchy watery eyes, runny nose, nasal congestion, etc).    3. Adverse food reaction - Testing to the most common foods was negative, including soy and milk.  - There is no indication for food avoidance or epinephrine injector at this time.   4. Return in about 6 months (around 02/23/2017).   Subjective:   Pamela [name] is a 46 y.o. female presenting today for evaluation of  Chief Complaint  Patient presents with  . Asthma  . Allergic Rhinitis     Pamela [name] has a history of the following: Patient Active Problem List   Diagnosis Date Noted  . Perennial allergic rhinitis 08/23/2016  . Mild persistent asthma, uncomplicated 29/93/7169    History obtained from: chart review and the patient.  Pamela [name] was referred by Lavone Orn, MD.     Pamela is a 46 y.o. female presenting for evaluation of allergic rhinoconjuctivitis and asthma. The patient states that she has been experiencing allergy and sinus symptoms for the past three to four years. She endorses daily symptoms including runny nose, congestion, cough, post nasal drip, as well as itchy/watery eyes. She also experiences frequent nose bleeds. She states her allergy symptoms are present all year round but are significantly worst in the spring and the fall. She also has been experiencing about three to four sinus infections per year, which need to be treated with antibiotics or steroids. In the  fall of 2018 she was diagnosed and treated for bronchitis. In February 2018, she was diagnosed with sinusitis and treated with a 10 day course of Augmentin, which resolved the infection. Then in May 2018, she was diagnosed with another sinus infection. She was treated with Augmentin and a Depo steroid injection. Her symptoms  did not resolve and she was put on an additional 10 day course of Penicillin, which resolved her infection.   For her allergy symptoms, she was started on Flonase in the spring and uses it on an as needed basis. The patient states it does help alleviate symptoms. She also uses simply saline. She has tried several over the counter anti-histamines and does not feel that they help alleviate her symptoms. She states that oral allergy medications worsen her nose bleeds. She uses Zatador for her itchy/watery eyes. She was also recently put on a steroid eye drop by her ophthalmologist due to the severity of her allergric conjuctivitis. She says she can no longer use her contacts due to her allergic eye symptoms.   Asthma/Respiratory Symptom History: The patient endorses having asthma as a child, which subsequently improved as she got older. When she was diagnosed and treated for bronchitis in the fall, the urgent care center provided her with a prescription for an albuterol inhaler. During the spring, she states she will use the inhaler every four hours "around the clock," on a daily basis. She says she will have wheezing, shortness of breath, chest tightness, and cough. Currently, she is using the albuterol inhaler approximately one to two times per week and experiences wheezing and difficulty breathing about two times per week. She denies night symptoms.   Food Allergy Symptom History: The patient endorses lactose intolerance and a soy sensitivity. When she ingests soy, such as in soy milk, she will experience nausea, abdominal pain, and diarrhea. She states she can eat yogurt in small portions. She denies having any anaphylactic or severe adverse reactions to food. She eats all treenuts, shellfish, fish, and wheat products.   Otherwise, there is no history of drug allergies, stinging insect allergies, or urticaria. There is no significant infectious history. Vaccinations are up to date.    Past Medical  History: Patient Active Problem List   Diagnosis Date Noted  . Perennial allergic rhinitis 08/23/2016  . Mild persistent asthma, uncomplicated 66/07/3014    Medication List:  Allergies as of 08/23/2016   No Known Allergies     Medication List       Accurate as of 08/23/16  1:40 PM. Always use your most recent med list.          albuterol 108 (90 Base) MCG/ACT inhaler Commonly known as:  PROVENTIL HFA;VENTOLIN HFA Inhale 2 puffs into the lungs every 6 (six) hours as needed for wheezing or shortness of breath.   FLUoxetine 20 MG capsule Commonly known as:  PROZAC fluoxetine 20 mg capsule   fluticasone 50 MCG/ACT nasal spray Commonly known as:  FLONASE fluticasone 50 mcg/actuation nasal spray,suspension   HYDROcodone-homatropine 5-1.5 MG/5ML syrup Commonly known as:  HYCODAN Take 5 mLs by mouth every 6 (six) hours as needed for cough.   LASTACAFT 0.25 % Soln Generic drug:  Alcaftadine Lastacaft 0.25 % eye drops   pantoprazole 40 MG tablet Commonly known as:  PROTONIX pantoprazole 40 mg tablet,delayed release  TK 1 T PO D   triamterene-hydrochlorothiazide 37.5-25 MG tablet Commonly known as:  MAXZIDE-25 triamterene 37.5 mg-hydrochlorothiazide 25 mg tablet  TK 1 T PO QAM  Birth History: non-contributory.   Developmental History: non-contributory.   Past Surgical History: Past Surgical History:  Procedure Laterality Date  . CESAREAN SECTION  1998  . TUBAL LIGATION  2008     Family History: Family History  Problem Relation Age of Onset  . Asthma Other   . Heart failure Other   . Hypertension Other      Social History: Pamela lives with her husband in a 72 year old house. There is no mildew or water damage in the house. There is wood flooring in the family room and carpet in the bedroom. There are no animals inside or outside the home. There is tobacco smoke exposure in the house. She has been a Pharmacist, hospital for 22 years.      Review of Systems: a  14-point review of systems is pertinent for what is mentioned in HPI.  Otherwise, all other systems were negative. Constitutional: negative other than that listed in the HPI Eyes: negative other than that listed in the HPI Ears, nose, mouth, throat, and face: negative other than that listed in the HPI Respiratory: negative other than that listed in the HPI Cardiovascular: negative other than that listed in the HPI Gastrointestinal: negative other than that listed in the HPI Genitourinary: negative other than that listed in the HPI Integument: negative other than that listed in the HPI Hematologic: negative other than that listed in the HPI Musculoskeletal: negative other than that listed in the HPI Neurological: negative other than that listed in the HPI Allergy/Immunologic: negative other than that listed in the HPI    Objective:   Blood pressure 130/78, pulse 70, temperature 98.5 F (36.9 C), resp. rate 18, height 5\' 5"  (1.651 m), weight (!) 303 lb 3.2 oz (137.5 kg), SpO2 96 %. Body mass index is 50.46 kg/m.   Physical Exam:  General: Alert, interactive, in no acute distress. Eyes: No conjunctival injection present on the right, No conjunctival injection present on the left, PERRL bilaterally, No discharge on the right and No discharge on the left Ears: Right TM pearly gray with normal light reflex, Left TM pearly gray with normal light reflex and Left TM intact without perforation.  Nose/Throat: External nose within normal limits, nasal crease present and septum midline, turbinates mildly edematous without discharge, post-pharynx mildly erythematous without cobblestoning in the posterior oropharynx. Tonsils unremarklable without exudates Neck: Supple without thyromegaly.  Adenopathy: No enlarged lymph nodes appreciated in the anterior cervical, occipital, axillary, epitrochlear, inguinal, or popliteal regions. Lungs: Clear to auscultation without wheezing, rhonchi or rales. No  increased work of breathing. CV: Normal S1/S2, no murmurs. Capillary refill <2 seconds.  Abdomen: Nondistended, nontender. No guarding or rebound tenderness. Bowel sounds present in all fields  Skin: Warm and dry, without lesions or rashes. Extremities:  No clubbing, cyanosis or edema. Neuro:   Grossly intact. No focal deficits appreciated. Responsive to questions.  Diagnostic studies:   Spirometry: results normal (FEV1: 83%, FVC: 98%, FEV1/FVC: 84%).    Spirometry consistent with normal pattern.  Allergy Studies:   Indoor/Outdoor Percutaneous Adult Environmental Panel: positive to Botrytis, cat and dog. Otherwise negative with adequate controls.  Indoor/Outdoor Selected Intradermal Environmental Panel: positive to mold mix #2. Otherwise negative with adequate controls.  Most Common Foods Panel (peanut, cashew, soy, fish mix, shellfish mix, wheat, milk, egg): Negative with adequate controls.    Rochele Pages, MD Internal Medicine PGY1    I performed a history and physical examination of the patient and discussed his management with the resident.  I reviewed the resident's note and agree with the documented findings and plan of care. The note in its entirety was edited by myself, including the physical exam, assessment, and plan.   Salvatore Marvel, MD [facility]

## 2016-09-13 DIAGNOSIS — J3081 Allergic rhinitis due to animal (cat) (dog) hair and dander: Secondary | ICD-10-CM | POA: Diagnosis not present

## 2016-09-14 ENCOUNTER — Encounter: Payer: Self-pay | Admitting: *Deleted

## 2016-09-14 DIAGNOSIS — J3089 Other allergic rhinitis: Secondary | ICD-10-CM | POA: Diagnosis not present

## 2016-09-14 NOTE — Addendum Note (Signed)
Addended by: Valentina Shaggy on: 09/14/2016 06:56 AM   Modules accepted: Orders

## 2016-09-14 NOTE — Progress Notes (Signed)
4 VIALS MADE. EXP 09/14/17. HC

## 2016-09-24 ENCOUNTER — Ambulatory Visit (INDEPENDENT_AMBULATORY_CARE_PROVIDER_SITE_OTHER): Payer: 59 | Admitting: *Deleted

## 2016-09-24 DIAGNOSIS — J309 Allergic rhinitis, unspecified: Secondary | ICD-10-CM

## 2016-09-25 NOTE — Progress Notes (Signed)
Immunotherapy   Patient Details  Name: Pamela [name] MRN: [id number] Date of Birth: Apr 07, 1970  09/25/2016  Pamela [name] started injections for  Cat-Dog & Molds. Following schedule: B  Frequency:2 times per week Epi-Pen:Epi-Pen Available  Consent signed and patient instructions given. No problems after 30 minutes in the office.   Constance Holster 09/25/2016, 8:48 AM

## 2016-09-27 ENCOUNTER — Ambulatory Visit (INDEPENDENT_AMBULATORY_CARE_PROVIDER_SITE_OTHER): Payer: 59 | Admitting: *Deleted

## 2016-09-27 DIAGNOSIS — J309 Allergic rhinitis, unspecified: Secondary | ICD-10-CM

## 2016-10-02 ENCOUNTER — Ambulatory Visit (INDEPENDENT_AMBULATORY_CARE_PROVIDER_SITE_OTHER): Payer: 59 | Admitting: *Deleted

## 2016-10-02 DIAGNOSIS — J309 Allergic rhinitis, unspecified: Secondary | ICD-10-CM | POA: Diagnosis not present

## 2016-10-09 ENCOUNTER — Ambulatory Visit (INDEPENDENT_AMBULATORY_CARE_PROVIDER_SITE_OTHER): Payer: 59 | Admitting: *Deleted

## 2016-10-09 DIAGNOSIS — J309 Allergic rhinitis, unspecified: Secondary | ICD-10-CM

## 2016-10-11 ENCOUNTER — Ambulatory Visit (INDEPENDENT_AMBULATORY_CARE_PROVIDER_SITE_OTHER): Payer: 59 | Admitting: *Deleted

## 2016-10-11 DIAGNOSIS — J309 Allergic rhinitis, unspecified: Secondary | ICD-10-CM | POA: Diagnosis not present

## 2016-10-16 ENCOUNTER — Ambulatory Visit (INDEPENDENT_AMBULATORY_CARE_PROVIDER_SITE_OTHER): Payer: 59 | Admitting: *Deleted

## 2016-10-16 DIAGNOSIS — J309 Allergic rhinitis, unspecified: Secondary | ICD-10-CM

## 2016-10-22 ENCOUNTER — Ambulatory Visit (INDEPENDENT_AMBULATORY_CARE_PROVIDER_SITE_OTHER): Payer: 59

## 2016-10-22 DIAGNOSIS — J309 Allergic rhinitis, unspecified: Secondary | ICD-10-CM

## 2016-11-05 ENCOUNTER — Ambulatory Visit (INDEPENDENT_AMBULATORY_CARE_PROVIDER_SITE_OTHER): Payer: 59 | Admitting: *Deleted

## 2016-11-05 DIAGNOSIS — J309 Allergic rhinitis, unspecified: Secondary | ICD-10-CM

## 2016-11-15 ENCOUNTER — Ambulatory Visit (INDEPENDENT_AMBULATORY_CARE_PROVIDER_SITE_OTHER): Payer: 59

## 2016-11-15 DIAGNOSIS — J309 Allergic rhinitis, unspecified: Secondary | ICD-10-CM

## 2016-11-22 ENCOUNTER — Ambulatory Visit (INDEPENDENT_AMBULATORY_CARE_PROVIDER_SITE_OTHER): Payer: 59 | Admitting: *Deleted

## 2016-11-22 ENCOUNTER — Telehealth: Payer: Self-pay | Admitting: Allergy & Immunology

## 2016-11-22 DIAGNOSIS — Z23 Encounter for immunization: Secondary | ICD-10-CM | POA: Diagnosis not present

## 2016-11-22 DIAGNOSIS — J309 Allergic rhinitis, unspecified: Secondary | ICD-10-CM | POA: Diagnosis not present

## 2016-11-22 NOTE — Telephone Encounter (Signed)
Pt came in and needs to have refills called into [facility] on [address] [phone].

## 2016-11-22 NOTE — Telephone Encounter (Signed)
Left message for patient advising on need for OV and that she can call the pharmacy to request refills.

## 2016-11-26 ENCOUNTER — Ambulatory Visit (INDEPENDENT_AMBULATORY_CARE_PROVIDER_SITE_OTHER): Payer: 59 | Admitting: Allergy & Immunology

## 2016-11-26 VITALS — BP 132/80 | HR 65 | Resp 16

## 2016-11-26 DIAGNOSIS — J309 Allergic rhinitis, unspecified: Secondary | ICD-10-CM | POA: Diagnosis not present

## 2016-11-26 DIAGNOSIS — J3089 Other allergic rhinitis: Secondary | ICD-10-CM

## 2016-11-26 DIAGNOSIS — J4530 Mild persistent asthma, uncomplicated: Secondary | ICD-10-CM

## 2016-11-26 MED ORDER — LEVOCETIRIZINE DIHYDROCHLORIDE 5 MG PO TABS: 5.0000 mg | ORAL_TABLET | Freq: Every evening | ORAL | 5 refills | Status: DC

## 2016-11-26 MED ORDER — MONTELUKAST SODIUM 10 MG PO TABS: 10.0000 mg | ORAL_TABLET | Freq: Every day | ORAL | 5 refills | Status: DC

## 2016-11-26 MED ORDER — FLUTICASONE PROPIONATE 50 MCG/ACT NA SUSP: 2.0000 | Freq: Every day | NASAL | 5 refills | Status: DC

## 2016-11-26 MED ORDER — ALBUTEROL SULFATE HFA 108 (90 BASE) MCG/ACT IN AERS: 2.0000 | INHALATION_SPRAY | Freq: Four times a day (QID) | RESPIRATORY_TRACT | 1 refills | Status: DC | PRN

## 2016-11-26 NOTE — Patient Instructions (Addendum)
1. Mild persistent asthma, uncomplicated - Lung testing was normal today.  - Daily controller medication(s): Singulair (montelukast) 10mg  nightly - Rescue medications: Ventolin 4 puffs every 4-6 hours as needed - Asthma control goals:  * Full participation in all desired activities (may need albuterol before activity) * Albuterol use two time or less a week on average (not counting use with activity) * Cough interfering with sleep two time or less a month  * Oral steroids no more than once a year * No hospitalizations  2. Perennial allergic rhinitis (molds, cat and dog) - Continue with Flonase (fluticasone) two sprays per nostril daily as needed. - Continue with Xyzal (levocetirizine) 5mg  tablet once daily and Singulair (montelukast) 10mg  daily - Continue with allergy shots at the same schedule.   3. Return in about 6 months (around 05/27/2017).  Please inform us of any Emergency Department visits, hospitalizations, or changes in symptoms. Call us before going to the ED for breathing or allergy symptoms since we might be able to fit you in for a sick visit. Feel free to contact us anytime with any questions, problems, or concerns.  It was a pleasure to see you again today! Enjoy the fall season!  Websites that have reliable patient information: 1. American Academy of Asthma, Allergy, and Immunology: www.aaaai.org 2. Food Allergy Research and Education (FARE): foodallergy.org 3. Mothers of Asthmatics: http://www.asthmacommunitynetwork.org 4. American College of Allergy, Asthma, and Immunology: www.acaai.org   Election Day is coming up on Tuesday, November 6th! Although it is too late to register to vote by mail, you can still register up to November 5th at any of the early voting locations. Try to early vote in case there are problems with your registration!   If you are turned away at the polls, you have the right to request a provisional ballot, which is required by law!      [place] (open 8am - 5pm) First Floor [place], [place] (open [place]) [place], [place] (open 7am - 7pm)  [place], [employer] (open 7am - 7pm) [address], [employer] (open 7am - 7pm) [address] [place], [employer] (open 7am - 7pm) [place], [place] (open [place]) [place], [place] (open 7am - 7pm) [place], [employer] (open 7am - 7pm) [place], [place]

## 2016-11-26 NOTE — Progress Notes (Signed)
FOLLOW UP  Date of Service/Encounter:  11/26/16   Assessment:   Perennial allergic rhinitis (cats, dogs, molds)  Mild persistent asthma, uncomplicated   Asthma Reportables:  Severity: intermittent  Risk: low Control: well controlled   Plan/Recommendations:   1. Mild persistent asthma, uncomplicated - Lung testing was normal today.  - Daily controller medication(s): Singulair (montelukast) 10mg  nightly - Rescue medications: Ventolin 4 puffs every 4-6 hours as needed - Asthma control goals:  * Full participation in all desired activities (may need albuterol before activity) * Albuterol use two time or less a week on average (not counting use with activity) * Cough interfering with sleep two time or less a month  * Oral steroids no more than once a year * No hospitalizations  2. Perennial allergic rhinitis (molds, cat and dog) - Continue with Flonase (fluticasone) two sprays per nostril daily as needed. - Continue with Xyzal (levocetirizine) 5mg  tablet once daily and Singulair (montelukast) 10mg  daily - Continue with allergy shots at the same schedule.   3. Return in about 6 months (around 05/27/2017).   Subjective:   Pamela [name] is a 46 y.o. female presenting today for follow up of  Chief Complaint  Patient presents with  . Asthma    Pamela [name] has a history of the following: Patient Active Problem List   Diagnosis Date Noted  . Perennial allergic rhinitis 08/23/2016  . Mild persistent asthma, uncomplicated 02/40/9735    History obtained from: chart review and patient.  Pamela N Ramnath's Primary Care Provider is Lavone Orn, MD.     Pamela is a 46 y.o. female presenting for a follow up visit. She was last seen in July 2018 at her first appointment. At that visit, her spirometry was normal, but she was endorsing some problems with shortness of breath. Therefore we added on Singulair 10mg  to help with both her asthma symptoms and her allergic  rhinitis. She had tested that was positive to cats, dogs, and molds. We continued with Flonase and added on Xyzal in addition to the Singulair. She opted to started immunotherapy.   Since the last visit, she has done well. She did have a cold around two weeks ago that lasted around three days, which is amazing for her. Typically colds will last upwards of 2-3 weeks. She did have nosebleeds once during the cold. She has not missed any days of school. She used albuterol twice in the last four weeks. She does have problems with church when she is around fragrances and when she is around environments with high allergen loads.   Pamela is on allergen immunotherapy. She receives two injections. Immunotherapy script #1 contains cat and dog. She currently receives 0.82mL of the GOLD vial (1/10,000). Immunotherapy script #2 contains molds. She currently receives 0.40mL of the GOLD vial (1/10,000). She started shots August of 2018 and not yet reached maintenance.   She works at [employer] Otherwise, there have been no changes to her past medical history, surgical history, family history, or social history.    Review of Systems: a 14-point review of systems is pertinent for what is mentioned in HPI.  Otherwise, all other systems were negative. Constitutional: negative other than that listed in the HPI Eyes: negative other than that listed in the HPI Ears, nose, mouth, throat, and face: negative other than that listed in the HPI Respiratory: negative other than that listed in the HPI Cardiovascular: negative other than that listed in the HPI Gastrointestinal: negative other than  that listed in the HPI Genitourinary: negative other than that listed in the HPI Integument: negative other than that listed in the HPI Hematologic: negative other than that listed in the HPI Musculoskeletal: negative other than that listed in the HPI Neurological: negative other than that listed in the  HPI Allergy/Immunologic: negative other than that listed in the HPI    Objective:   Blood pressure 132/80, pulse 65, resp. rate 16, SpO2 98 %. There is no height or weight on file to calculate BMI.   Physical Exam:  General: Alert, interactive, in no acute distress. Very pleasant female.  Eyes: No conjunctival injection bilaterally, no discharge on the right, no discharge on the left and no Horner-Trantas dots present. PERRL bilaterally. EOMI without pain. No photophobia.  Ears: Right TM pearly gray with normal light reflex, Left TM pearly gray with normal light reflex, Right TM intact without perforation and Left TM intact without perforation.  Nose/Throat: External nose within normal limits and septum midline. Turbinates markedly edematous without discharge. Posterior oropharynx mildly erythematous without cobblestoning in the posterior oropharynx. Tonsils 2+ without exudates.  Tongue without thrush. Adenopathy: shoddy bilateral anterior cervical lymphadenopathy Lungs: Clear to auscultation without wheezing, rhonchi or rales. No increased work of breathing. CV: Normal S1/S2. No murmurs. Capillary refill <2 seconds.  Skin: Warm and dry, without lesions or rashes. Neuro:   Grossly intact. No focal deficits appreciated. Responsive to questions.  Diagnostic studies:   Spirometry: results normal (FEV1: 2.51/99%, FVC: 3.15/100%, FEV1/FVC: 80%).    Spirometry consistent with normal pattern.   Allergy Studies: none      Salvatore Marvel, MD [facility]

## 2016-11-27 ENCOUNTER — Other Ambulatory Visit: Payer: Self-pay

## 2016-11-27 MED ORDER — ALBUTEROL SULFATE HFA 108 (90 BASE) MCG/ACT IN AERS: 2.0000 | INHALATION_SPRAY | Freq: Four times a day (QID) | RESPIRATORY_TRACT | 1 refills | Status: DC | PRN

## 2016-12-07 ENCOUNTER — Ambulatory Visit (INDEPENDENT_AMBULATORY_CARE_PROVIDER_SITE_OTHER): Payer: 59

## 2016-12-07 DIAGNOSIS — J309 Allergic rhinitis, unspecified: Secondary | ICD-10-CM | POA: Diagnosis not present

## 2016-12-09 DIAGNOSIS — N760 Acute vaginitis: Secondary | ICD-10-CM | POA: Diagnosis not present

## 2016-12-14 ENCOUNTER — Ambulatory Visit (INDEPENDENT_AMBULATORY_CARE_PROVIDER_SITE_OTHER): Payer: 59

## 2016-12-14 DIAGNOSIS — J309 Allergic rhinitis, unspecified: Secondary | ICD-10-CM

## 2016-12-25 ENCOUNTER — Ambulatory Visit (INDEPENDENT_AMBULATORY_CARE_PROVIDER_SITE_OTHER): Payer: 59 | Admitting: *Deleted

## 2016-12-25 DIAGNOSIS — J309 Allergic rhinitis, unspecified: Secondary | ICD-10-CM | POA: Diagnosis not present

## 2017-01-07 ENCOUNTER — Ambulatory Visit (INDEPENDENT_AMBULATORY_CARE_PROVIDER_SITE_OTHER): Payer: 59 | Admitting: *Deleted

## 2017-01-07 DIAGNOSIS — J309 Allergic rhinitis, unspecified: Secondary | ICD-10-CM | POA: Diagnosis not present

## 2017-01-21 ENCOUNTER — Ambulatory Visit (INDEPENDENT_AMBULATORY_CARE_PROVIDER_SITE_OTHER): Payer: 59 | Admitting: Allergy & Immunology

## 2017-01-21 DIAGNOSIS — J309 Allergic rhinitis, unspecified: Secondary | ICD-10-CM | POA: Diagnosis not present

## 2017-02-08 ENCOUNTER — Ambulatory Visit (INDEPENDENT_AMBULATORY_CARE_PROVIDER_SITE_OTHER): Payer: 59

## 2017-02-08 DIAGNOSIS — I10 Essential (primary) hypertension: Secondary | ICD-10-CM | POA: Diagnosis not present

## 2017-02-08 DIAGNOSIS — J309 Allergic rhinitis, unspecified: Secondary | ICD-10-CM | POA: Diagnosis not present

## 2017-02-08 DIAGNOSIS — K219 Gastro-esophageal reflux disease without esophagitis: Secondary | ICD-10-CM | POA: Diagnosis not present

## 2017-02-08 DIAGNOSIS — R739 Hyperglycemia, unspecified: Secondary | ICD-10-CM | POA: Diagnosis not present

## 2017-02-08 DIAGNOSIS — Z0000 Encounter for general adult medical examination without abnormal findings: Secondary | ICD-10-CM | POA: Diagnosis not present

## 2017-02-22 ENCOUNTER — Ambulatory Visit (INDEPENDENT_AMBULATORY_CARE_PROVIDER_SITE_OTHER): Payer: 59

## 2017-02-22 DIAGNOSIS — J309 Allergic rhinitis, unspecified: Secondary | ICD-10-CM

## 2017-03-12 ENCOUNTER — Ambulatory Visit (INDEPENDENT_AMBULATORY_CARE_PROVIDER_SITE_OTHER): Payer: 59

## 2017-03-12 DIAGNOSIS — J309 Allergic rhinitis, unspecified: Secondary | ICD-10-CM | POA: Diagnosis not present

## 2017-03-20 ENCOUNTER — Ambulatory Visit (INDEPENDENT_AMBULATORY_CARE_PROVIDER_SITE_OTHER): Payer: 59

## 2017-03-20 DIAGNOSIS — J309 Allergic rhinitis, unspecified: Secondary | ICD-10-CM

## 2017-04-02 ENCOUNTER — Ambulatory Visit (INDEPENDENT_AMBULATORY_CARE_PROVIDER_SITE_OTHER): Payer: 59 | Admitting: *Deleted

## 2017-04-02 DIAGNOSIS — J309 Allergic rhinitis, unspecified: Secondary | ICD-10-CM | POA: Diagnosis not present

## 2017-04-23 ENCOUNTER — Ambulatory Visit: Payer: 59 | Admitting: *Deleted

## 2017-04-23 ENCOUNTER — Ambulatory Visit (INDEPENDENT_AMBULATORY_CARE_PROVIDER_SITE_OTHER): Payer: 59 | Admitting: *Deleted

## 2017-04-23 DIAGNOSIS — J309 Allergic rhinitis, unspecified: Secondary | ICD-10-CM | POA: Diagnosis not present

## 2017-05-06 ENCOUNTER — Ambulatory Visit (INDEPENDENT_AMBULATORY_CARE_PROVIDER_SITE_OTHER): Payer: 59 | Admitting: *Deleted

## 2017-05-06 DIAGNOSIS — J309 Allergic rhinitis, unspecified: Secondary | ICD-10-CM

## 2017-05-10 ENCOUNTER — Ambulatory Visit (INDEPENDENT_AMBULATORY_CARE_PROVIDER_SITE_OTHER): Payer: 59

## 2017-05-10 DIAGNOSIS — J309 Allergic rhinitis, unspecified: Secondary | ICD-10-CM

## 2017-05-13 ENCOUNTER — Ambulatory Visit (INDEPENDENT_AMBULATORY_CARE_PROVIDER_SITE_OTHER): Payer: 59

## 2017-05-13 DIAGNOSIS — J309 Allergic rhinitis, unspecified: Secondary | ICD-10-CM

## 2017-05-23 ENCOUNTER — Ambulatory Visit (INDEPENDENT_AMBULATORY_CARE_PROVIDER_SITE_OTHER): Payer: 59 | Admitting: *Deleted

## 2017-05-23 DIAGNOSIS — J309 Allergic rhinitis, unspecified: Secondary | ICD-10-CM | POA: Diagnosis not present

## 2017-05-29 ENCOUNTER — Ambulatory Visit (INDEPENDENT_AMBULATORY_CARE_PROVIDER_SITE_OTHER): Payer: 59

## 2017-05-29 DIAGNOSIS — J309 Allergic rhinitis, unspecified: Secondary | ICD-10-CM | POA: Diagnosis not present

## 2017-06-04 ENCOUNTER — Ambulatory Visit (INDEPENDENT_AMBULATORY_CARE_PROVIDER_SITE_OTHER): Payer: 59 | Admitting: *Deleted

## 2017-06-04 DIAGNOSIS — J309 Allergic rhinitis, unspecified: Secondary | ICD-10-CM

## 2017-06-14 ENCOUNTER — Ambulatory Visit (INDEPENDENT_AMBULATORY_CARE_PROVIDER_SITE_OTHER): Payer: 59

## 2017-06-14 DIAGNOSIS — J309 Allergic rhinitis, unspecified: Secondary | ICD-10-CM | POA: Diagnosis not present

## 2017-06-20 ENCOUNTER — Ambulatory Visit (INDEPENDENT_AMBULATORY_CARE_PROVIDER_SITE_OTHER): Payer: 59

## 2017-06-20 DIAGNOSIS — J309 Allergic rhinitis, unspecified: Secondary | ICD-10-CM | POA: Diagnosis not present

## 2017-06-27 ENCOUNTER — Ambulatory Visit (INDEPENDENT_AMBULATORY_CARE_PROVIDER_SITE_OTHER): Payer: 59 | Admitting: *Deleted

## 2017-06-27 DIAGNOSIS — J309 Allergic rhinitis, unspecified: Secondary | ICD-10-CM | POA: Diagnosis not present

## 2017-07-08 DIAGNOSIS — Z5181 Encounter for therapeutic drug level monitoring: Secondary | ICD-10-CM | POA: Diagnosis not present

## 2017-07-08 DIAGNOSIS — M549 Dorsalgia, unspecified: Secondary | ICD-10-CM | POA: Diagnosis not present

## 2017-07-08 DIAGNOSIS — I10 Essential (primary) hypertension: Secondary | ICD-10-CM | POA: Diagnosis not present

## 2017-07-17 ENCOUNTER — Other Ambulatory Visit: Payer: Self-pay | Admitting: Allergy

## 2017-07-17 ENCOUNTER — Other Ambulatory Visit: Payer: Self-pay | Admitting: Allergy & Immunology

## 2017-07-17 ENCOUNTER — Ambulatory Visit (INDEPENDENT_AMBULATORY_CARE_PROVIDER_SITE_OTHER): Payer: 59

## 2017-07-17 DIAGNOSIS — J309 Allergic rhinitis, unspecified: Secondary | ICD-10-CM

## 2017-07-17 MED ORDER — MONTELUKAST SODIUM 10 MG PO TABS: 10.0000 mg | ORAL_TABLET | Freq: Every day | ORAL | 0 refills | Status: DC

## 2017-07-31 ENCOUNTER — Ambulatory Visit (INDEPENDENT_AMBULATORY_CARE_PROVIDER_SITE_OTHER): Payer: 59

## 2017-07-31 DIAGNOSIS — J309 Allergic rhinitis, unspecified: Secondary | ICD-10-CM

## 2017-08-06 ENCOUNTER — Ambulatory Visit (INDEPENDENT_AMBULATORY_CARE_PROVIDER_SITE_OTHER): Payer: 59 | Admitting: *Deleted

## 2017-08-06 DIAGNOSIS — J309 Allergic rhinitis, unspecified: Secondary | ICD-10-CM

## 2017-08-13 ENCOUNTER — Ambulatory Visit (INDEPENDENT_AMBULATORY_CARE_PROVIDER_SITE_OTHER): Payer: 59 | Admitting: *Deleted

## 2017-08-13 DIAGNOSIS — J309 Allergic rhinitis, unspecified: Secondary | ICD-10-CM

## 2017-08-26 ENCOUNTER — Ambulatory Visit (INDEPENDENT_AMBULATORY_CARE_PROVIDER_SITE_OTHER): Payer: 59

## 2017-08-26 DIAGNOSIS — J309 Allergic rhinitis, unspecified: Secondary | ICD-10-CM | POA: Diagnosis not present

## 2017-08-28 DIAGNOSIS — J3089 Other allergic rhinitis: Secondary | ICD-10-CM

## 2017-08-28 NOTE — Progress Notes (Signed)
VIALS EXP 08-29-18

## 2017-09-02 DIAGNOSIS — I10 Essential (primary) hypertension: Secondary | ICD-10-CM | POA: Diagnosis not present

## 2017-09-02 DIAGNOSIS — R7301 Impaired fasting glucose: Secondary | ICD-10-CM | POA: Diagnosis not present

## 2017-09-04 ENCOUNTER — Ambulatory Visit (INDEPENDENT_AMBULATORY_CARE_PROVIDER_SITE_OTHER): Payer: 59 | Admitting: *Deleted

## 2017-09-04 DIAGNOSIS — J309 Allergic rhinitis, unspecified: Secondary | ICD-10-CM

## 2017-09-13 ENCOUNTER — Ambulatory Visit (INDEPENDENT_AMBULATORY_CARE_PROVIDER_SITE_OTHER): Payer: 59

## 2017-09-13 DIAGNOSIS — J309 Allergic rhinitis, unspecified: Secondary | ICD-10-CM | POA: Diagnosis not present

## 2017-09-23 DIAGNOSIS — L089 Local infection of the skin and subcutaneous tissue, unspecified: Secondary | ICD-10-CM | POA: Diagnosis not present

## 2017-09-27 ENCOUNTER — Ambulatory Visit (INDEPENDENT_AMBULATORY_CARE_PROVIDER_SITE_OTHER): Payer: 59

## 2017-09-27 DIAGNOSIS — J309 Allergic rhinitis, unspecified: Secondary | ICD-10-CM | POA: Diagnosis not present

## 2017-10-03 ENCOUNTER — Ambulatory Visit (INDEPENDENT_AMBULATORY_CARE_PROVIDER_SITE_OTHER): Payer: 59 | Admitting: *Deleted

## 2017-10-03 DIAGNOSIS — J309 Allergic rhinitis, unspecified: Secondary | ICD-10-CM | POA: Diagnosis not present

## 2017-10-14 ENCOUNTER — Other Ambulatory Visit: Payer: Self-pay | Admitting: Allergy

## 2017-10-22 ENCOUNTER — Other Ambulatory Visit: Payer: Self-pay | Admitting: Allergy & Immunology

## 2017-10-22 ENCOUNTER — Ambulatory Visit (INDEPENDENT_AMBULATORY_CARE_PROVIDER_SITE_OTHER): Payer: 59 | Admitting: *Deleted

## 2017-10-22 DIAGNOSIS — J309 Allergic rhinitis, unspecified: Secondary | ICD-10-CM | POA: Diagnosis not present

## 2017-10-22 NOTE — Telephone Encounter (Signed)
Courtesy refill  

## 2017-11-04 ENCOUNTER — Other Ambulatory Visit: Payer: Self-pay | Admitting: Allergy

## 2017-11-04 MED ORDER — MONTELUKAST SODIUM 10 MG PO TABS: 10.0000 mg | ORAL_TABLET | Freq: Every day | ORAL | 0 refills | Status: DC

## 2017-11-19 ENCOUNTER — Ambulatory Visit (INDEPENDENT_AMBULATORY_CARE_PROVIDER_SITE_OTHER): Payer: 59 | Admitting: *Deleted

## 2017-11-19 DIAGNOSIS — J309 Allergic rhinitis, unspecified: Secondary | ICD-10-CM | POA: Diagnosis not present

## 2017-11-28 ENCOUNTER — Other Ambulatory Visit: Payer: Self-pay | Admitting: Allergy & Immunology

## 2017-11-28 ENCOUNTER — Other Ambulatory Visit: Payer: Self-pay

## 2017-12-03 ENCOUNTER — Ambulatory Visit (INDEPENDENT_AMBULATORY_CARE_PROVIDER_SITE_OTHER): Payer: 59

## 2017-12-03 DIAGNOSIS — J309 Allergic rhinitis, unspecified: Secondary | ICD-10-CM

## 2017-12-05 ENCOUNTER — Ambulatory Visit: Payer: 59 | Admitting: Allergy & Immunology

## 2017-12-05 DIAGNOSIS — J309 Allergic rhinitis, unspecified: Secondary | ICD-10-CM

## 2017-12-19 ENCOUNTER — Ambulatory Visit (INDEPENDENT_AMBULATORY_CARE_PROVIDER_SITE_OTHER): Payer: 59 | Admitting: *Deleted

## 2017-12-19 DIAGNOSIS — J309 Allergic rhinitis, unspecified: Secondary | ICD-10-CM | POA: Diagnosis not present

## 2017-12-24 DIAGNOSIS — J3081 Allergic rhinitis due to animal (cat) (dog) hair and dander: Secondary | ICD-10-CM | POA: Diagnosis not present

## 2017-12-24 NOTE — Progress Notes (Signed)
Vials exp 12-26-18

## 2017-12-27 ENCOUNTER — Telehealth: Payer: Self-pay

## 2017-12-27 ENCOUNTER — Encounter: Payer: Self-pay | Admitting: Family Medicine

## 2017-12-27 ENCOUNTER — Ambulatory Visit: Payer: 59 | Admitting: Family Medicine

## 2017-12-27 VITALS — BP 130/90 | HR 63 | Resp 16 | Ht 65.0 in | Wt 282.6 lb

## 2017-12-27 DIAGNOSIS — J309 Allergic rhinitis, unspecified: Secondary | ICD-10-CM | POA: Diagnosis not present

## 2017-12-27 DIAGNOSIS — J4530 Mild persistent asthma, uncomplicated: Secondary | ICD-10-CM

## 2017-12-27 MED ORDER — EPINEPHRINE 0.3 MG/0.3ML IJ SOAJ: INTRAMUSCULAR | 3 refills | Status: DC

## 2017-12-27 MED ORDER — MONTELUKAST SODIUM 10 MG PO TABS: 10.0000 mg | ORAL_TABLET | Freq: Every day | ORAL | 0 refills | Status: DC

## 2017-12-27 NOTE — Telephone Encounter (Signed)
Informed patient that we sent in [place]. Patient voiced understanding.

## 2017-12-27 NOTE — Patient Instructions (Addendum)
1. Mild persistent asthma, uncomplicated - Lung testing was normal today.  - Daily controller medication(s): Singulair (montelukast) 10mg  nightly - Rescue medications: Ventolin 4 puffs every 4-6 hours as needed - Asthma control goals:  * Full participation in all desired activities (may need albuterol before activity) * Albuterol use two time or less a week on average (not counting use with activity) * Cough interfering with sleep two time or less a month  * Oral steroids no more than once a year * No hospitalizations  2. Perennial allergic rhinitis (molds, cat and dog) - Continue with Flonase (fluticasone) two sprays per nostril daily as needed. - Continue with Xyzal (levocetirizine) 5mg  tablet once daily and Singulair (montelukast) 10mg  daily - Continue with allergy shots at the same schedule.  - Begin nasal saline rinses as needed for nasal congestionConsider nasal saline gel for dry noseBegin Mucinex [phone] mg twice a day and increase hydration as tolerated to thin mucus secterions  3. Follow up in 6 months or sooner if needed  Please inform us of any Emergency Department visits, hospitalizations, or changes in symptoms. Call us before going to the ED for breathing or allergy symptoms since we might be able to fit you in for a sick visit. Feel free to contact us anytime with any questions, problems, or concerns.

## 2017-12-27 NOTE — Progress Notes (Addendum)
[address] [place] [address] Dept: [phone]  FOLLOW UP NOTE  Patient ID: Pamela N Cappella, female    DOB: [birth year]  Age: 47 y.o. MRN: [id number] Date of Office Visit: 12/27/2017  Assessment  Chief Complaint: Allergic Rhinitis   HPI Pamela [name] is a 47 year old female who presents to the clinic for a follow up visit.  She reports her asthma has been well controlled overall, however, she reports an episode on Monday where she experienced shortness of breath that was relieved by 2 inhalations of albuterol.  Other than Monday, she reports no shortness of breath, wheezing, or coughing.  She has been out of montelukast for about 1 month and she uses her inhaler about once a month with resolution of symptoms.  Allergic rhinitis is reported as significantly improved since beginning her allergen immunotherapy injections.  She continues Xyzal once a day and is infrequently using Flonase.  Her medications are listed in the chart.   Drug Allergies:  No Known Allergies  Physical Exam: BP 130/90 (BP Location: Left Arm, Patient Position: Sitting, Cuff Size: Large)   Pulse 63   Resp 16   Ht 5\' 5"  (1.651 m)   Wt 282 lb 9.6 oz (128.2 kg)   BMI 47.03 kg/m    Physical Exam  Constitutional: She is oriented to person, place, and time. She appears well-developed and well-nourished.  HENT:  Head: Normocephalic.  Right Ear: External ear normal.  Left Ear: External ear normal.  Mouth/Throat: Oropharynx is clear and moist.  Bilateral nares slightly erythematous and edematous with clear nasal drainage noted.  Pharynx normal.  Ears normal.  Eyes normal.  Eyes: Conjunctivae are normal.  Neck: Normal range of motion. Neck supple.  Cardiovascular: Normal rate, regular rhythm and normal heart sounds.  No murmur noted  Pulmonary/Chest: Effort normal and breath sounds normal.  Lungs clear to auscultation  Musculoskeletal: Normal range of motion.  Neurological: She is alert and  oriented to person, place, and time.  Skin: Skin is warm and dry.  Psychiatric: She has a normal mood and affect. Her behavior is normal. Judgment and thought content normal.  Vitals reviewed.   Diagnostics: FVC 3.20, FEV1 2.54.  Predicted FVC 3.07, predicted FEV1 2.48.  Spirometry is within the normal range.  Assessment and Plan: 1. Mild persistent asthma, uncomplicated   2. Allergic rhinitis, unspecified seasonality, unspecified trigger     Meds ordered this encounter  Medications  . montelukast (SINGULAIR) 10 MG tablet    Sig: Take 1 tablet (10 mg total) by mouth at bedtime.    Dispense:  30 tablet    Refill:  0    Courtesy refill Patient needs ov.  Marland Kitchen EPINEPHrine (AUVI-Q) 0.3 mg/0.3 mL IJ SOAJ injection    Sig: Use for life threatening allergic reactions    Dispense:  4 Device    Refill:  3    [id number] (Mobile)    Patient Instructions  1. Mild persistent asthma, uncomplicated - Lung testing was normal today.  - Daily controller medication(s): Singulair (montelukast) 10mg  nightly - Rescue medications: Ventolin 4 puffs every 4-6 hours as needed - Asthma control goals:  * Full participation in all desired activities (may need albuterol before activity) * Albuterol use two time or less a week on average (not counting use with activity) * Cough interfering with sleep two time or less a month  * Oral steroids no more than once a year * No hospitalizations  2. Perennial allergic rhinitis (  molds, cat and dog) - Continue with Flonase (fluticasone) two sprays per nostril daily as needed. - Continue with Xyzal (levocetirizine) 5mg  tablet once daily and Singulair (montelukast) 10mg  daily - Continue with allergy shots at the same schedule.  - Begin nasal saline rinses as needed for nasal congestion.  Consider nasal saline gel for dry nose.  Begin Mucinex [phone] mg twice a day and increase hydration as tolerated to thin mucus secterions  3. Follow up in 6 months or sooner if  needed   Return in about 6 months (around 06/27/2018), or if symptoms worsen or fail to improve.    Thank you for the opportunity to care for this patient.  Please do not hesitate to contact me with questions.  Gareth Morgan, FNP [facility] [place]:  I reviewed the Nurse Practitioner's note and agree with the documented findings and plan of care. We discussed the patient and developed a plan concurrently.   Prudy Feeler, MD [facility]

## 2017-12-27 NOTE — Telephone Encounter (Signed)
Attempted to contact patient to inform her that we sent in Auvi-Q since she is doing allergy shots. Left message on voicemail instructing her to return my call. Need to give her [employer] phone number incase they do not contact her.

## 2018-01-16 ENCOUNTER — Ambulatory Visit (INDEPENDENT_AMBULATORY_CARE_PROVIDER_SITE_OTHER): Payer: 59 | Admitting: *Deleted

## 2018-01-16 DIAGNOSIS — J309 Allergic rhinitis, unspecified: Secondary | ICD-10-CM

## 2018-01-23 ENCOUNTER — Ambulatory Visit (INDEPENDENT_AMBULATORY_CARE_PROVIDER_SITE_OTHER): Payer: 59 | Admitting: *Deleted

## 2018-01-23 DIAGNOSIS — J309 Allergic rhinitis, unspecified: Secondary | ICD-10-CM

## 2018-01-27 ENCOUNTER — Ambulatory Visit (INDEPENDENT_AMBULATORY_CARE_PROVIDER_SITE_OTHER): Payer: 59

## 2018-01-27 DIAGNOSIS — J309 Allergic rhinitis, unspecified: Secondary | ICD-10-CM | POA: Diagnosis not present

## 2018-02-04 ENCOUNTER — Ambulatory Visit (INDEPENDENT_AMBULATORY_CARE_PROVIDER_SITE_OTHER): Payer: 59 | Admitting: *Deleted

## 2018-02-04 DIAGNOSIS — J309 Allergic rhinitis, unspecified: Secondary | ICD-10-CM

## 2018-02-14 ENCOUNTER — Ambulatory Visit (INDEPENDENT_AMBULATORY_CARE_PROVIDER_SITE_OTHER): Payer: 59 | Admitting: *Deleted

## 2018-02-14 DIAGNOSIS — J309 Allergic rhinitis, unspecified: Secondary | ICD-10-CM | POA: Diagnosis not present

## 2018-02-17 ENCOUNTER — Other Ambulatory Visit: Payer: Self-pay | Admitting: Family Medicine

## 2018-02-17 ENCOUNTER — Encounter: Payer: Self-pay | Admitting: Pulmonary Disease

## 2018-02-17 ENCOUNTER — Ambulatory Visit (INDEPENDENT_AMBULATORY_CARE_PROVIDER_SITE_OTHER): Payer: 59 | Admitting: Pulmonary Disease

## 2018-02-17 VITALS — BP 160/90 | HR 65 | Ht 65.0 in | Wt 286.0 lb

## 2018-02-17 DIAGNOSIS — Z23 Encounter for immunization: Secondary | ICD-10-CM

## 2018-02-17 DIAGNOSIS — R0683 Snoring: Secondary | ICD-10-CM

## 2018-02-17 NOTE — Patient Instructions (Signed)
Moderate probability of significant obstructive sleep apnea  Allergies  We will set you up with a home sleep study  I will see you back in the office in about 3 months  Call with any significant concerns   Sleep Apnea Sleep apnea affects breathing during sleep. It causes breathing to stop for a short time or to become shallow. It can also increase the risk of:  Heart attack.  Stroke.  Being very overweight (obese).  Diabetes.  Heart failure.  Irregular heartbeat. The goal of treatment is to help you breathe normally again. What are the causes? There are three kinds of sleep apnea:  Obstructive sleep apnea. This is caused by a blocked or collapsed airway.  Central sleep apnea. This happens when the brain does not send the right signals to the muscles that control breathing.  Mixed sleep apnea. This is a combination of obstructive and central sleep apnea. The most common cause of this condition is a collapsed or blocked airway. This can happen if:  Your throat muscles are too relaxed.  Your tongue and tonsils are too large.  You are overweight.  Your airway is too small. What increases the risk?  Being overweight.  Smoking.  Having a small airway.  Being older.  Being female.  Drinking alcohol.  Taking medicines to calm yourself (sedatives or tranquilizers).  Having family members with the condition. What are the signs or symptoms?  Trouble staying asleep.  Being sleepy or tired during the day.  Getting angry a lot.  Loud snoring.  Headaches in the morning.  Not being able to focus your mind (concentrate).  Forgetting things.  Less interest in sex.  Mood swings.  Personality changes.  Feelings of sadness (depression).  Waking up a lot during the night to pee (urinate).  Dry mouth.  Sore throat. How is this diagnosed?  Your medical history.  A physical exam.  A test that is done when you are sleeping (sleep study). The test is  most often done in a sleep lab but may also be done at home. How is this treated?   Sleeping on your side.  Using a medicine to get rid of mucus in your nose (decongestant).  Avoiding the use of alcohol, medicines to help you relax, or certain pain medicines (narcotics).  Losing weight, if needed.  Changing your diet.  Not smoking.  Using a machine to open your airway while you sleep, such as: ? An oral appliance. This is a mouthpiece that shifts your lower jaw forward. ? A CPAP device. This device blows air through a mask when you breathe out (exhale). ? An EPAP device. This has valves that you put in each nostril. ? A BPAP device. This device blows air through a mask when you breathe in (inhale) and breathe out.  Having surgery if other treatments do not work. It is important to get treatment for sleep apnea. Without treatment, it can lead to:  High blood pressure.  Coronary artery disease.  In men, not being able to have an erection (impotence).  Reduced thinking ability. Follow these instructions at home: Lifestyle  Make changes that your doctor recommends.  Eat a healthy diet.  Lose weight if needed.  Avoid alcohol, medicines to help you relax, and some pain medicines.  Do not use any products that contain nicotine or tobacco, such as cigarettes, e-cigarettes, and chewing tobacco. If you need help quitting, ask your doctor. General instructions  Take over-the-counter and prescription medicines only as  told by your doctor.  If you were given a machine to use while you sleep, use it only as told by your doctor.  If you are having surgery, make sure to tell your doctor you have sleep apnea. You may need to bring your device with you.  Keep all follow-up visits as told by your doctor. This is important. Contact a doctor if:  The machine that you were given to use during sleep bothers you or does not seem to be working.  You do not get better.  You get  worse. Get help right away if:  Your chest hurts.  You have trouble breathing in enough air.  You have an uncomfortable feeling in your back, arms, or stomach.  You have trouble talking.  One side of your body feels weak.  A part of your face is hanging down. These symptoms may be an emergency. Do not wait to see if the symptoms will go away. Get medical help right away. Call your local emergency services (911 in the [place] Do not drive yourself to the hospital. Summary  This condition affects breathing during sleep.  The most common cause is a collapsed or blocked airway.  The goal of treatment is to help you breathe normally while you sleep. This information is not intended to replace advice given to you by your health care provider. Make sure you discuss any questions you have with your health care provider. Document Released: 11/01/2007 Document Revised: 09/17/2017 Document Reviewed: 09/17/2017 Elsevier Interactive Patient Education  [employer].

## 2018-02-17 NOTE — Progress Notes (Signed)
Pamela [name]    [id number]    [birth year]  Primary Care Physician:Griffin, Jenny Reichmann, MD  Referring Physician: Lavone Orn, MD [facility] [employer] [address] [place], [place] [address]  Chief complaint:   Patient with significant snoring  HPI:  Snoring, witnessed apneas, insomnia She is currently working on weight loss Has lost about 15 pounds recently Usually goes to bed between 10 and 10:30 PM Takes about 5 minutes to fall asleep Usually wakes up between 3 and 4 times during the night Final wake up time about 530  Snoring, quite bothersome Wakes up with headaches sometimes No dry mouth in the morning Occasional night sweats  Suffers more from insomnia and daytime sleepiness just prior to.  No family history of obstructive sleep apnea  She receives allergy shots for environmental allergies Has noticed significant improvement in symptoms   Outpatient Encounter Medications as of 02/17/2018  Medication Sig  . albuterol (PROVENTIL HFA;VENTOLIN HFA) 108 (90 Base) MCG/ACT inhaler Inhale 2 puffs into the lungs every 6 (six) hours as needed for wheezing or shortness of breath.  . EPINEPHrine (AUVI-Q) 0.3 mg/0.3 mL IJ SOAJ injection Use for life threatening allergic reactions  . FLUoxetine (PROZAC) 20 MG capsule fluoxetine 20 mg capsule  . fluticasone (FLONASE) 50 MCG/ACT nasal spray Place 2 sprays into both nostrils daily.  Marland Kitchen levocetirizine (XYZAL) 5 MG tablet TAKE 1 TABLET BY MOUTH EVERY EVENING  . losartan-hydrochlorothiazide (HYZAAR) 100-12.5 MG tablet 100 tablets.  . montelukast (SINGULAIR) 10 MG tablet TAKE 1 TABLET BY MOUTH AT BEDTIME  . pantoprazole (PROTONIX) 40 MG tablet pantoprazole 40 mg tablet,delayed release  TK 1 T PO D  . VYVANSE 60 MG capsule Take 60 mg by mouth daily.  . [DISCONTINUED] montelukast (SINGULAIR) 10 MG tablet Take 1 tablet (10 mg total) by mouth at bedtime.  . [DISCONTINUED] triamterene-hydrochlorothiazide (MAXZIDE-25) 37.5-25 MG tablet     No facility-administered encounter medications on file as of 02/17/2018.     Allergies as of 02/17/2018  . (No Known Allergies)    Past Medical History:  Diagnosis Date  . Asthma     Past Surgical History:  Procedure Laterality Date  . CESAREAN SECTION  1998  . TUBAL LIGATION  2008    Family History  Problem Relation Age of Onset  . Asthma Other   . Heart failure Other   . Hypertension Other     Social History   Socioeconomic History  . Marital status: Married    Spouse name: Not on file  . Number of children: Not on file  . Years of education: Not on file  . Highest education level: Not on file  Occupational History  . Not on file  Social Needs  . Financial resource strain: Not on file  . Food insecurity:    Worry: Not on file    Inability: Not on file  . Transportation needs:    Medical: Not on file    Non-medical: Not on file  Tobacco Use  . Smoking status: Never Smoker  . Smokeless tobacco: Never Used  Substance and Sexual Activity  . Alcohol use: Yes    Alcohol/week: 1.0 standard drinks    Types: 1 Glasses of wine per week  . Drug use: No  . Sexual activity: Never  Lifestyle  . Physical activity:    Days per week: Not on file    Minutes per session: Not on file  . Stress: Not on file  Relationships  .  Social connections:    Talks on phone: Not on file    Gets together: Not on file    Attends religious service: Not on file    Active member of club or organization: Not on file    Attends meetings of clubs or organizations: Not on file    Relationship status: Not on file  . Intimate partner violence:    Fear of current or ex partner: Not on file    Emotionally abused: Not on file    Physically abused: Not on file    Forced sexual activity: Not on file  Other Topics Concern  . Not on file  Social History Narrative  . Not on file    Review of Systems  Constitutional: Negative.   HENT: Negative.   Eyes: Negative.   Respiratory:  Positive for apnea.   Cardiovascular: Negative.   Gastrointestinal: Negative.   Endocrine: Negative.   Genitourinary: Negative.   Psychiatric/Behavioral: Positive for sleep disturbance.    There were no vitals filed for this visit.   Physical Exam  Constitutional: She appears well-developed and well-nourished.  HENT:  Head: Normocephalic and atraumatic.  Mallampati 3  Eyes: Pupils are equal, round, and reactive to light. Conjunctivae and EOM are normal. Right eye exhibits no discharge. Left eye exhibits no discharge.  Neck: Normal range of motion. Neck supple. No tracheal deviation present. No thyromegaly present.  Cardiovascular: Normal rate and regular rhythm.  Pulmonary/Chest: Effort normal and breath sounds normal. No respiratory distress. She has no wheezes. She has no rales.  Abdominal: Soft. Bowel sounds are normal. She exhibits no distension. There is no abdominal tenderness. There is no rebound.   Results of the Epworth flowsheet 02/17/2018  Sitting and reading 1  Watching TV 1  Sitting, inactive in a public place (e.g. a theatre or a meeting) 0  As a passenger in a car for an hour without a break 2  Lying down to rest in the afternoon when circumstances permit 3  Sitting and talking to someone 0  Sitting quietly after a lunch without alcohol 0  In a car, while stopped for a few minutes in traffic 0  Total score 7   Assessment:  Moderate obstructive sleep apnea  Obesity  Plan/Recommendations: We will set you up for home sleep study  Pathophysiology of sleep disordered breathing discussed  Treatment options discussed  Importance of exercise and weight loss discussed  We will go ahead and provide you with a flu shot today  We will see back in the office in about 3 months Call with any significant concerns  Sherrilyn Rist MD [facility] 02/17/2018, 4:34 PM  CC: Lavone Orn, MD

## 2018-02-20 DIAGNOSIS — R300 Dysuria: Secondary | ICD-10-CM | POA: Diagnosis not present

## 2018-03-11 ENCOUNTER — Ambulatory Visit (INDEPENDENT_AMBULATORY_CARE_PROVIDER_SITE_OTHER): Payer: 59

## 2018-03-11 DIAGNOSIS — J309 Allergic rhinitis, unspecified: Secondary | ICD-10-CM

## 2018-03-21 ENCOUNTER — Ambulatory Visit (INDEPENDENT_AMBULATORY_CARE_PROVIDER_SITE_OTHER): Payer: 59 | Admitting: *Deleted

## 2018-03-21 DIAGNOSIS — J309 Allergic rhinitis, unspecified: Secondary | ICD-10-CM

## 2018-03-24 ENCOUNTER — Telehealth: Payer: Self-pay | Admitting: Pulmonary Disease

## 2018-03-24 DIAGNOSIS — G4733 Obstructive sleep apnea (adult) (pediatric): Secondary | ICD-10-CM

## 2018-03-24 NOTE — Telephone Encounter (Signed)
Patient returned call, advised that HST order has been placed today and given to [facility] for precert.  She is aware it could take 2 weeks for a call back from [facility] to schedule HST.  Patient is fine with this and will call back if she has not heard from [place] in 2 weeks.  No call back is needed.

## 2018-03-24 NOTE — Telephone Encounter (Signed)
Pt calling to follow up on HST that was to be scheduled at her last office visit.    I do not see where a HST was ordered.    Please advise.

## 2018-03-24 NOTE — Telephone Encounter (Signed)
LMTCB.   Per  [facility], patient was to have a HST. No order noted. Order for HST placed. Will await patient call back.

## 2018-03-28 ENCOUNTER — Ambulatory Visit (INDEPENDENT_AMBULATORY_CARE_PROVIDER_SITE_OTHER): Payer: 59 | Admitting: *Deleted

## 2018-03-28 DIAGNOSIS — J309 Allergic rhinitis, unspecified: Secondary | ICD-10-CM | POA: Diagnosis not present

## 2018-04-08 ENCOUNTER — Ambulatory Visit (INDEPENDENT_AMBULATORY_CARE_PROVIDER_SITE_OTHER): Payer: 59 | Admitting: *Deleted

## 2018-04-08 DIAGNOSIS — J309 Allergic rhinitis, unspecified: Secondary | ICD-10-CM | POA: Diagnosis not present

## 2018-04-30 ENCOUNTER — Ambulatory Visit (INDEPENDENT_AMBULATORY_CARE_PROVIDER_SITE_OTHER): Payer: 59 | Admitting: *Deleted

## 2018-04-30 DIAGNOSIS — J309 Allergic rhinitis, unspecified: Secondary | ICD-10-CM | POA: Diagnosis not present

## 2018-05-08 DIAGNOSIS — R2240 Localized swelling, mass and lump, unspecified lower limb: Secondary | ICD-10-CM | POA: Diagnosis not present

## 2018-05-09 ENCOUNTER — Ambulatory Visit (INDEPENDENT_AMBULATORY_CARE_PROVIDER_SITE_OTHER): Payer: 59 | Admitting: *Deleted

## 2018-05-09 DIAGNOSIS — J309 Allergic rhinitis, unspecified: Secondary | ICD-10-CM

## 2018-05-19 ENCOUNTER — Ambulatory Visit (INDEPENDENT_AMBULATORY_CARE_PROVIDER_SITE_OTHER): Payer: 59

## 2018-05-19 DIAGNOSIS — J309 Allergic rhinitis, unspecified: Secondary | ICD-10-CM

## 2018-05-29 ENCOUNTER — Ambulatory Visit (INDEPENDENT_AMBULATORY_CARE_PROVIDER_SITE_OTHER): Payer: 59 | Admitting: *Deleted

## 2018-05-29 DIAGNOSIS — J309 Allergic rhinitis, unspecified: Secondary | ICD-10-CM | POA: Diagnosis not present

## 2018-06-04 ENCOUNTER — Ambulatory Visit (INDEPENDENT_AMBULATORY_CARE_PROVIDER_SITE_OTHER): Payer: 59

## 2018-06-04 DIAGNOSIS — J309 Allergic rhinitis, unspecified: Secondary | ICD-10-CM | POA: Diagnosis not present

## 2018-06-24 ENCOUNTER — Ambulatory Visit (INDEPENDENT_AMBULATORY_CARE_PROVIDER_SITE_OTHER): Payer: 59 | Admitting: *Deleted

## 2018-06-24 DIAGNOSIS — J309 Allergic rhinitis, unspecified: Secondary | ICD-10-CM

## 2018-07-02 ENCOUNTER — Ambulatory Visit (INDEPENDENT_AMBULATORY_CARE_PROVIDER_SITE_OTHER): Payer: 59 | Admitting: *Deleted

## 2018-07-02 DIAGNOSIS — J309 Allergic rhinitis, unspecified: Secondary | ICD-10-CM

## 2018-07-08 DIAGNOSIS — J3081 Allergic rhinitis due to animal (cat) (dog) hair and dander: Secondary | ICD-10-CM | POA: Diagnosis not present

## 2018-07-08 NOTE — Progress Notes (Signed)
VIALS EXP 07-08-2019

## 2018-07-09 DIAGNOSIS — J3089 Other allergic rhinitis: Secondary | ICD-10-CM | POA: Diagnosis not present

## 2018-07-16 ENCOUNTER — Ambulatory Visit (INDEPENDENT_AMBULATORY_CARE_PROVIDER_SITE_OTHER): Payer: 59 | Admitting: *Deleted

## 2018-07-16 DIAGNOSIS — J309 Allergic rhinitis, unspecified: Secondary | ICD-10-CM | POA: Diagnosis not present

## 2018-08-01 ENCOUNTER — Ambulatory Visit (INDEPENDENT_AMBULATORY_CARE_PROVIDER_SITE_OTHER): Payer: 59 | Admitting: *Deleted

## 2018-08-01 DIAGNOSIS — J309 Allergic rhinitis, unspecified: Secondary | ICD-10-CM

## 2018-08-12 ENCOUNTER — Ambulatory Visit (INDEPENDENT_AMBULATORY_CARE_PROVIDER_SITE_OTHER): Payer: 59 | Admitting: *Deleted

## 2018-08-12 DIAGNOSIS — J309 Allergic rhinitis, unspecified: Secondary | ICD-10-CM

## 2018-08-20 ENCOUNTER — Ambulatory Visit (INDEPENDENT_AMBULATORY_CARE_PROVIDER_SITE_OTHER): Payer: 59 | Admitting: *Deleted

## 2018-08-20 ENCOUNTER — Telehealth: Payer: Self-pay | Admitting: *Deleted

## 2018-08-20 DIAGNOSIS — J309 Allergic rhinitis, unspecified: Secondary | ICD-10-CM

## 2018-08-20 NOTE — Telephone Encounter (Signed)
Not to my knowledge, no. She just seemed to want to get the Flu shot.

## 2018-08-20 NOTE — Telephone Encounter (Signed)
I called and left a message to call the office. Thank you

## 2018-08-20 NOTE — Telephone Encounter (Signed)
Is she traveling?

## 2018-08-20 NOTE — Telephone Encounter (Signed)
Pt was wanting the flu shot this month. Advised to pt would need to speak with the physician. Is the patient permitted to receive the injection now or is it more towards mid October that the patient receives the injection? Please advise.

## 2018-08-28 ENCOUNTER — Ambulatory Visit (INDEPENDENT_AMBULATORY_CARE_PROVIDER_SITE_OTHER): Payer: 59 | Admitting: *Deleted

## 2018-08-28 DIAGNOSIS — J309 Allergic rhinitis, unspecified: Secondary | ICD-10-CM

## 2018-09-05 ENCOUNTER — Ambulatory Visit (INDEPENDENT_AMBULATORY_CARE_PROVIDER_SITE_OTHER): Payer: 59

## 2018-09-05 DIAGNOSIS — J309 Allergic rhinitis, unspecified: Secondary | ICD-10-CM

## 2018-09-09 ENCOUNTER — Encounter: Payer: Self-pay | Admitting: Allergy & Immunology

## 2018-09-09 ENCOUNTER — Ambulatory Visit (INDEPENDENT_AMBULATORY_CARE_PROVIDER_SITE_OTHER): Payer: 59 | Admitting: Allergy & Immunology

## 2018-09-09 ENCOUNTER — Other Ambulatory Visit: Payer: Self-pay

## 2018-09-09 VITALS — BP 186/112 | HR 86 | Temp 95.5°F | Resp 16 | Ht 65.0 in | Wt 310.8 lb

## 2018-09-09 DIAGNOSIS — J4530 Mild persistent asthma, uncomplicated: Secondary | ICD-10-CM | POA: Diagnosis not present

## 2018-09-09 DIAGNOSIS — J3089 Other allergic rhinitis: Secondary | ICD-10-CM | POA: Diagnosis not present

## 2018-09-09 NOTE — Patient Instructions (Addendum)
1. Mild persistent asthma, uncomplicated - Daily controller medication(s): Singulair (montelukast) 10mg  nightly - Rescue medications: Ventolin 4 puffs every 4-6 hours as needed - Asthma control goals:  * Full participation in all desired activities (may need albuterol before activity) * Albuterol use two time or less a week on average (not counting use with activity) * Cough interfering with sleep two time or less a month  * Oral steroids no more than once a year * No hospitalizations  2. Perennial allergic rhinitis (molds, cat and dog) - Continue with Flonase (fluticasone) two sprays per nostril daily as needed. - Continue with Xyzal (levocetirizine) 5mg  tablet once daily and Singulair (montelukast) 10mg  daily - Continue with allergy shots at the same schedule.  - We are going to get blood work to see if there is something that we are missing.  3. Return in about 6 months (around 03/12/2019). This can be an in-person, a virtual Webex or a telephone follow up visit.   Please inform us of any Emergency Department visits, hospitalizations, or changes in symptoms. Call us before going to the ED for breathing or allergy symptoms since we might be able to fit you in for a sick visit. Feel free to contact us anytime with any questions, problems, or concerns.  It was a pleasure to see you again today!  Websites that have reliable patient information: 1. American Academy of Asthma, Allergy, and Immunology: www.aaaai.org 2. Food Allergy Research and Education (FARE): foodallergy.org 3. Mothers of Asthmatics: http://www.asthmacommunitynetwork.org 4. American College of Allergy, Asthma, and Immunology: www.acaai.org  "Like" [place] on Facebook and Instagram for our latest updates!      Make sure you are registered to vote! If you have moved or changed any of your contact information, you will need to get this updated before voting!  In some cases, you MAY be able to register to vote online:  [email or web address]    Voter ID laws are NOT going into effect for the General Election in November 2020! DO NOT let this stop you from exercising your right to vote!   Absentee voting is the SAFEST way to vote during the coronavirus pandemic!   Download and print an absentee ballot request form at [email or web address] or you can scan the QR code below with your smart phone:      More information on absentee ballots can be found here: [email or web address]

## 2018-09-09 NOTE — Progress Notes (Signed)
FOLLOW UP  Date of Service/Encounter:  09/09/18   Assessment:   Perennial allergic rhinitis (cats, dogs, molds) - on allergen immunotherapy  Mild persistent asthma, uncomplicated   Asthma Reportables:  Severity: intermittent  Risk: low Control: well controlled   [name] presents for a follow up visit. She is on the allergen immunotherapy and although she feels that her symptoms are better controlled than they have been in the past, she still feels that she has worsening symptoms when she is outdoors.  Specifically, she is concerned pollens.  She did have prick testing and intradermal testing at her initial visit which was negative for pollens.  However, given the continued symptoms, we will get an environmental allergy panel via the blood today.  We are also going to get an extended mold panel in case there is an outdoor mold that might be contributing to her symptoms.  If indicated with the labs, we will change her allergen immunotherapy vials accordingly.   Plan/Recommendations:   1. Mild persistent asthma, uncomplicated - Daily controller medication(s): Singulair (montelukast) 10mg  nightly - Rescue medications: Ventolin 4 puffs every 4-6 hours as needed - Asthma control goals:  * Full participation in all desired activities (may need albuterol before activity) * Albuterol use two time or less a week on average (not counting use with activity) * Cough interfering with sleep two time or less a month  * Oral steroids no more than once a year * No hospitalizations  2. Perennial allergic rhinitis (molds, cat and dog) - Continue with Flonase (fluticasone) two sprays per nostril daily as needed. - Continue with Xyzal (levocetirizine) 5mg  tablet once daily and Singulair (montelukast) 10mg  daily - Continue with allergy shots at the same schedule.  - We are going to get blood work to see if there is something that we are missing.  3. Return in about 6 months (around  03/12/2019). This can be an in-person, a virtual Webex or a telephone follow up visit.   Subjective:   Pamela [name] is a 48 y.o. female presenting today for follow up of  Chief Complaint  Patient presents with  . Allergic Rhinitis     mucous in the nose    Pamela [name] has a history of the following: Patient Active Problem List   Diagnosis Date Noted  . Allergic rhinitis 08/23/2016  . Mild persistent asthma, uncomplicated 19/50/9326    History obtained from: chart review and patient.  Pamela is a 48 y.o. female presenting for a follow up visit.  She was last seen in November 2019 by and her nurse practitioner.  At that time, she was doing very well.  For her asthma, she was continued on Singulair 10 mg nightly as well as Ventolin as needed.  For her rhinitis, she was continued on Flonase, Xyzal, and Singulair.  She was continued on allergy shots at the same schedule.  Since last visit, she has mostly done well.  She did stop her montelukast for a short period of time, around two weeks. Then she developed some upper wheezing. She is only using her Flonase on a PRN basis. Overall she does feel that she is doing better. This was a constant problem and now it is just occasionally. She is concerned that we might have missed an allergen because she has cold symptoms and sinus pressure occasionally.   Pamela is on allergen immunotherapy. She receives two injections. Immunotherapy script #1 contains cat and dog. She currently receives 0.76mL of the  RED vial (1/100). Immunotherapy script #2 contains molds. She currently receives 0.19mL of the RED vial (1/100). She started shots August of 2018 and reached maintenance in April of 2019.  She has had no adverse reactions to her shots.  She reports in general, they have improved her symptoms.  She does have a history of GERD and is on Protonix 40 mg daily.  She definitely feels the GERD when she does not take it.  Otherwise, there have been no  changes to her past medical history, surgical history, family history, or social history.    Review of Systems  Constitutional: Negative.  Negative for chills, fever, malaise/fatigue and weight loss.  HENT: Positive for congestion and sinus pain. Negative for ear discharge and ear pain.   Eyes: Negative for pain, discharge and redness.  Respiratory: Negative for cough, sputum production, shortness of breath and wheezing.   Cardiovascular: Negative.  Negative for chest pain and palpitations.  Gastrointestinal: Positive for heartburn. Negative for abdominal pain, nausea and vomiting.  Skin: Negative.  Negative for itching and rash.  Neurological: Negative for dizziness and headaches.  Endo/Heme/Allergies: Negative for environmental allergies. Does not bruise/bleed easily.       Objective:   Blood pressure (!) 186/112, pulse 86, temperature (!) 95.5 F (35.3 C), temperature source Temporal, resp. rate 16, height 5\' 5"  (1.651 m), weight (!) 310 lb 12.8 oz (141 kg), SpO2 95 %. Body mass index is 51.72 kg/m.   Physical Exam:  Physical Exam  Constitutional: She appears well-developed.  HENT:  Head: Normocephalic and atraumatic.  Right Ear: Tympanic membrane, external ear and ear canal normal.  Left Ear: Tympanic membrane and ear canal normal.  Nose: Mucosal edema present. No rhinorrhea, nasal deformity or septal deviation. No epistaxis. Right sinus exhibits no maxillary sinus tenderness and no frontal sinus tenderness. Left sinus exhibits no maxillary sinus tenderness and no frontal sinus tenderness.  Mouth/Throat: Uvula is midline and oropharynx is clear and moist. Mucous membranes are not pale and not dry.  Mild cobblestoning present in the posterior oropharynx.   Eyes: Pupils are equal, round, and reactive to light. Conjunctivae and EOM are normal. Right eye exhibits no chemosis and no discharge. Left eye exhibits no chemosis and no discharge. Right conjunctiva is not injected. Left  conjunctiva is not injected.  Cardiovascular: Normal rate, regular rhythm and normal heart sounds.  Respiratory: Effort normal and breath sounds normal. No accessory muscle usage. No tachypnea. No respiratory distress. She has no wheezes. She has no rhonchi. She has no rales. She exhibits no tenderness.  Lymphadenopathy:    She has no cervical adenopathy.  Neurological: She is alert.  Skin: No abrasion, no petechiae and no rash noted. Rash is not papular, not vesicular and not urticarial. No erythema. No pallor.  No urticaria or eczematous lesions noted.   Psychiatric: She has a normal mood and affect.     Diagnostic studies: none     Salvatore Marvel, MD  [facility]

## 2018-09-10 ENCOUNTER — Encounter: Payer: Self-pay | Admitting: Allergy & Immunology

## 2018-09-11 LAB — IGE+ALLERGENS ZONE 2(30)
Alternaria Alternata IgE: <0.1 kU/L
Amer Sycamore IgE Qn: <0.1 kU/L
Aspergillus Fumigatus IgE: <0.1 kU/L
Bahia Grass IgE: <0.1 kU/L
Bermuda Grass IgE: <0.1 kU/L
Cat Dander IgE: 0.8 kU/L — AB
Cedar, Mountain IgE: <0.1 kU/L
Cladosporium Herbarum IgE: <0.1 kU/L
Cockroach, American IgE: <0.1 kU/L
Common Silver Birch IgE: <0.1 kU/L
D Farinae IgE: <0.1 kU/L
D Pteronyssinus IgE: <0.1 kU/L
Dog Dander IgE: <0.1 kU/L
Elm, American IgE: <0.1 kU/L
Hickory, White IgE: <0.1 kU/L
IgE (Immunoglobulin E), Serum: 73 IU/mL (ref 6–495)
Johnson Grass IgE: <0.1 kU/L
Maple/Box Elder IgE: <0.1 kU/L
Mucor Racemosus IgE: <0.1 kU/L
Mugwort IgE Qn: <0.1 kU/L
Nettle IgE: <0.1 kU/L
Oak, White IgE: <0.1 kU/L
Penicillium Chrysogen IgE: 0.13 kU/L — AB
Pigweed, Rough IgE: <0.1 kU/L
Plantain, English IgE: <0.1 kU/L
Ragweed, Short IgE: <0.1 kU/L
Sheep Sorrel IgE Qn: <0.1 kU/L
Stemphylium Herbarum IgE: <0.1 kU/L
Sweet gum IgE RAST Ql: <0.1 kU/L
Timothy Grass IgE: <0.1 kU/L
White Mulberry IgE: <0.1 kU/L

## 2018-09-11 LAB — ALLERGEN PROFILE, MOLD
Aureobasidi Pullulans IgE: <0.1 kU/L
Candida Albicans IgE: <0.1 kU/L
M009-IgE Fusarium proliferatum: <0.1 kU/L
M014-IgE Epicoccum purpur: <0.1 kU/L
Phoma Betae IgE: <0.1 kU/L
Setomelanomma Rostrat: <0.1 kU/L

## 2018-09-17 ENCOUNTER — Encounter: Payer: Self-pay | Admitting: *Deleted

## 2018-09-19 ENCOUNTER — Telehealth: Payer: Self-pay | Admitting: *Deleted

## 2018-09-19 ENCOUNTER — Ambulatory Visit (INDEPENDENT_AMBULATORY_CARE_PROVIDER_SITE_OTHER): Payer: 59 | Admitting: *Deleted

## 2018-09-19 DIAGNOSIS — J309 Allergic rhinitis, unspecified: Secondary | ICD-10-CM | POA: Diagnosis not present

## 2018-09-19 NOTE — Telephone Encounter (Signed)
Patient came in for her allergy injections today and asked about her blood test results. Informed patient of results and she voiced understanding. Does not want to retest at the moment.

## 2018-10-03 ENCOUNTER — Ambulatory Visit (INDEPENDENT_AMBULATORY_CARE_PROVIDER_SITE_OTHER): Payer: 59

## 2018-10-03 DIAGNOSIS — J309 Allergic rhinitis, unspecified: Secondary | ICD-10-CM | POA: Diagnosis not present

## 2018-10-17 ENCOUNTER — Ambulatory Visit (INDEPENDENT_AMBULATORY_CARE_PROVIDER_SITE_OTHER): Payer: 59

## 2018-10-17 DIAGNOSIS — J309 Allergic rhinitis, unspecified: Secondary | ICD-10-CM | POA: Diagnosis not present

## 2018-10-21 DIAGNOSIS — J3081 Allergic rhinitis due to animal (cat) (dog) hair and dander: Secondary | ICD-10-CM

## 2018-10-21 NOTE — Progress Notes (Signed)
Vials exp 10-21-19

## 2018-10-30 ENCOUNTER — Other Ambulatory Visit: Payer: Self-pay | Admitting: Allergy & Immunology

## 2018-11-03 ENCOUNTER — Ambulatory Visit (INDEPENDENT_AMBULATORY_CARE_PROVIDER_SITE_OTHER): Payer: 59

## 2018-11-03 DIAGNOSIS — J309 Allergic rhinitis, unspecified: Secondary | ICD-10-CM | POA: Diagnosis not present

## 2018-12-08 ENCOUNTER — Ambulatory Visit (INDEPENDENT_AMBULATORY_CARE_PROVIDER_SITE_OTHER): Payer: 59

## 2018-12-08 DIAGNOSIS — J309 Allergic rhinitis, unspecified: Secondary | ICD-10-CM

## 2018-12-30 ENCOUNTER — Ambulatory Visit (INDEPENDENT_AMBULATORY_CARE_PROVIDER_SITE_OTHER): Payer: 59

## 2018-12-30 DIAGNOSIS — J309 Allergic rhinitis, unspecified: Secondary | ICD-10-CM

## 2019-01-22 ENCOUNTER — Ambulatory Visit (INDEPENDENT_AMBULATORY_CARE_PROVIDER_SITE_OTHER): Payer: 59

## 2019-01-22 DIAGNOSIS — J309 Allergic rhinitis, unspecified: Secondary | ICD-10-CM

## 2019-01-27 ENCOUNTER — Ambulatory Visit (INDEPENDENT_AMBULATORY_CARE_PROVIDER_SITE_OTHER): Payer: 59

## 2019-01-27 DIAGNOSIS — J309 Allergic rhinitis, unspecified: Secondary | ICD-10-CM

## 2019-02-09 ENCOUNTER — Ambulatory Visit (INDEPENDENT_AMBULATORY_CARE_PROVIDER_SITE_OTHER): Payer: 59 | Admitting: *Deleted

## 2019-02-09 DIAGNOSIS — J309 Allergic rhinitis, unspecified: Secondary | ICD-10-CM | POA: Diagnosis not present

## 2019-02-20 ENCOUNTER — Ambulatory Visit (INDEPENDENT_AMBULATORY_CARE_PROVIDER_SITE_OTHER): Payer: 59 | Admitting: *Deleted

## 2019-02-20 DIAGNOSIS — J309 Allergic rhinitis, unspecified: Secondary | ICD-10-CM | POA: Diagnosis not present

## 2019-03-02 ENCOUNTER — Ambulatory Visit (INDEPENDENT_AMBULATORY_CARE_PROVIDER_SITE_OTHER): Payer: 59

## 2019-03-02 DIAGNOSIS — J309 Allergic rhinitis, unspecified: Secondary | ICD-10-CM | POA: Diagnosis not present

## 2019-03-16 ENCOUNTER — Ambulatory Visit (INDEPENDENT_AMBULATORY_CARE_PROVIDER_SITE_OTHER): Payer: 59

## 2019-03-16 DIAGNOSIS — J309 Allergic rhinitis, unspecified: Secondary | ICD-10-CM | POA: Diagnosis not present

## 2019-03-17 ENCOUNTER — Ambulatory Visit: Payer: 59 | Admitting: Allergy & Immunology

## 2019-03-21 ENCOUNTER — Other Ambulatory Visit: Payer: Self-pay | Admitting: Pulmonary Disease

## 2019-03-21 DIAGNOSIS — G4733 Obstructive sleep apnea (adult) (pediatric): Secondary | ICD-10-CM

## 2019-03-24 ENCOUNTER — Encounter: Payer: Self-pay | Admitting: Allergy & Immunology

## 2019-03-24 ENCOUNTER — Ambulatory Visit (INDEPENDENT_AMBULATORY_CARE_PROVIDER_SITE_OTHER): Payer: 59 | Admitting: Allergy & Immunology

## 2019-03-24 ENCOUNTER — Other Ambulatory Visit: Payer: Self-pay

## 2019-03-24 VITALS — BP 130/76 | HR 83 | Temp 97.6°F | Resp 18

## 2019-03-24 DIAGNOSIS — J4530 Mild persistent asthma, uncomplicated: Secondary | ICD-10-CM | POA: Diagnosis not present

## 2019-03-24 DIAGNOSIS — J3089 Other allergic rhinitis: Secondary | ICD-10-CM

## 2019-03-24 MED ORDER — EPINEPHRINE 0.3 MG/0.3ML IJ SOAJ: INTRAMUSCULAR | 2 refills | Status: DC

## 2019-03-24 MED ORDER — MONTELUKAST SODIUM 10 MG PO TABS: 10.0000 mg | ORAL_TABLET | Freq: Every day | ORAL | 4 refills | Status: DC

## 2019-03-24 NOTE — Patient Instructions (Addendum)
1. Mild persistent asthma, uncomplicated - Lung testing looked great today. - I think we are headed in the right direction.  - Daily controller medication(s): Singulair (montelukast) 10mg  nightly - Rescue medications: Ventolin 4 puffs every 4-6 hours as needed - Asthma control goals:  * Full participation in all desired activities (may need albuterol before activity) * Albuterol use two time or less a week on average (not counting use with activity) * Cough interfering with sleep two time or less a month  * Oral steroids no more than once a year * No hospitalizations  2. Perennial allergic rhinitis (molds, cat and dog) - Continue with Flonase (fluticasone) two sprays per nostril daily as needed. - Continue with Xyzal (levocetirizine) 5mg  tablet once daily and Singulair (montelukast) 10mg  daily - Continue with allergy shots at the same schedule.   3. Return in about 6 months (around 09/21/2019). This can be an in-person, a virtual Webex or a telephone follow up visit.   Please inform us of any Emergency Department visits, hospitalizations, or changes in symptoms. Call us before going to the ED for breathing or allergy symptoms since we might be able to fit you in for a sick visit. Feel free to contact us anytime with any questions, problems, or concerns.  It was a pleasure to see you again today!  Websites that have reliable patient information: 1. American Academy of Asthma, Allergy, and Immunology: www.aaaai.org 2. Food Allergy Research and Education (FARE): foodallergy.org 3. Mothers of Asthmatics: http://www.asthmacommunitynetwork.org 4. American College of Allergy, Asthma, and Immunology: www.acaai.org   COVID-19 Vaccine Information can be found at: ShippingScam.co.uk For questions related to vaccine distribution or appointments, please email [email or web address] or call [phone].     "Like" [place] on Facebook and  Instagram for our latest updates!        Make sure you are registered to vote! If you have moved or changed any of your contact information, you will need to get this updated before voting!  In some cases, you MAY be able to register to vote online: [email or web address]

## 2019-03-24 NOTE — Progress Notes (Signed)
FOLLOW UP  Date of Service/Encounter:  03/24/19   Assessment:   Perennial allergic rhinitis(cats, dogs, molds) - on allergen immunotherapy  Mild persistent asthma, uncomplicated  Adverse reactions to each shot consisting of cold-like symptoms for 1-2 days    [name] is doing very well on her current regimen.  The allergy shots have made a big change to her general health.  She has not required prednisone or antibiotics at all since starting the allergy shots.  Her asthma seems under better control with the shots well, although with the social distancing of COVID-19, everyone's frequency of infections seems to have gone down.  Regardless, she is very happy that she made the decision to start allergen immunotherapy.  We will plan to continue allergen immunotherapy for 3 to 5 years total.  Plan/Recommendations:   1. Mild persistent asthma, uncomplicated - Lung testing looked great today. - I think we are headed in the right direction.  - Daily controller medication(s): Singulair (montelukast) 10mg  nightly - Rescue medications: Ventolin 4 puffs every 4-6 hours as needed - Asthma control goals:  * Full participation in all desired activities (may need albuterol before activity) * Albuterol use two time or less a week on average (not counting use with activity) * Cough interfering with sleep two time or less a month  * Oral steroids no more than once a year * No hospitalizations  2. Perennial allergic rhinitis (molds, cat and dog) - Continue with Flonase (fluticasone) two sprays per nostril daily as needed. - Continue with Xyzal (levocetirizine) 5mg  tablet once daily and Singulair (montelukast) 10mg  daily - Continue with allergy shots at the same schedule.   3. Return in about 6 months (around 09/21/2019). This can be an in-person, a virtual Webex or a telephone follow up visit.  Subjective:   Pamela [name] is a 49 y.o. female presenting today for follow up of  Chief  Complaint  Patient presents with  . Allergic Rhinitis   . Follow-up    Pamela [name] has a history of the following: Patient Active Problem List   Diagnosis Date Noted  . Allergic rhinitis 08/23/2016  . Mild persistent asthma, uncomplicated [id number]     History obtained from: chart review and patient.  Pamela is a 49 y.o. female presenting for a follow up visit.  She was last seen in August 2020.  At that time, she was reporting that she continued to feel that her symptoms were worse when she is outdoors.  We did prick and intradermal testing which was negative for pollens at her first visit.  We did get an environmental allergy panel via the blood as well as an extended mold panel.  Unfortunately, both of these were negative for pollens as well.  We continued with Flonase as well as Xyzal and Singulair for her rhinitis.  Her asthma is controlled with the Singulair and Ventolin as needed.  Since the last visit, she has done well. She has noticed over time that when the vial was updated and she had to come weekly, she develops "cold like symptoms for a few days. She does not say anything because she is so much better with this regimen. She was having bronchitis routinely and CXR, but now she likes how she is doing better now.   Asthma/Respiratory Symptom History: She remains on the montelukast daily. She has not needed her rescue inhaler much at all. She did have a pretty bad week around one month ago. She did not  need prednisone and did not go to Urgent Care. She thinks that it was the second week of December. But she has not needed antibiotics or prednisone at all since she started seeing me.  She is very happy with how well she is doing.  Allergic Rhinitis Symptom History: She remains on her Flonase and cetirizine as needed.  She does report that she tends to become somewhat sick for 1 to 2 days after her allergy shots.   Pamela is on allergen immunotherapy. She receives two injections.  Immunotherapy script #1 contains cat and dog. She currently receives 0.44mL of the RED vial (1/100). Immunotherapy script #2 contains molds. She currently receives 0.39mL of the RED vial (1/100). She started shots August of 2018 and reached maintenance in April of 2019.  She has had no adverse reactions to her shots.  She reports in general, they have improved her symptoms.  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  This has been very helpful for her health, although she never requested it.  She is hoping to be able to get the vaccine for COVID-19 at the end of the month.  Otherwise, there have been no changes to her past medical history, surgical history, family history, or social history.    Review of Systems  Constitutional: Negative.  Negative for chills, fever, malaise/fatigue and weight loss.  HENT: Negative.  Negative for congestion, ear discharge and ear pain.   Eyes: Negative for pain, discharge and redness.  Respiratory: Negative for cough, sputum production, shortness of breath and wheezing.   Cardiovascular: Negative.  Negative for chest pain and palpitations.  Gastrointestinal: Negative for abdominal pain, constipation, diarrhea, heartburn, nausea and vomiting.  Skin: Negative.  Negative for itching and rash.  Neurological: Negative for dizziness and headaches.  Endo/Heme/Allergies: Negative for environmental allergies. Does not bruise/bleed easily.       Objective:   Blood pressure 130/76, pulse 83, temperature 97.6 F (36.4 C), temperature source Temporal, resp. rate 18, SpO2 96 %. There is no height or weight on file to calculate BMI.   Physical Exam:  Physical Exam  Constitutional: She appears well-developed.  Very pleasant female.  Talkative.  HENT:  Head: Normocephalic and atraumatic.  Right Ear: Tympanic membrane, external ear and ear canal normal.  Left Ear: Tympanic membrane, external ear and ear canal  normal.  Nose: Mucosal edema and rhinorrhea present. No nose lacerations, nasal deformity or septal deviation. No epistaxis. Right sinus exhibits no maxillary sinus tenderness and no frontal sinus tenderness. Left sinus exhibits no maxillary sinus tenderness and no frontal sinus tenderness.  Mouth/Throat: Uvula is midline and oropharynx is clear and moist. Mucous membranes are not pale and not dry.  No maxillary sinus tenderness.  Turbinates are normal bilaterally although slightly edematous.  Eyes: Pupils are equal, round, and reactive to light. Conjunctivae and EOM are normal. Right eye exhibits no chemosis and no discharge. Left eye exhibits no chemosis and no discharge. Right conjunctiva is not injected. Left conjunctiva is not injected.  Allergic shiners present.  Cardiovascular: Normal rate, regular rhythm and normal heart sounds.  Respiratory: Effort normal and breath sounds normal. No accessory muscle usage. No tachypnea. No respiratory distress. She has no wheezes. She has no rhonchi. She has no rales. She exhibits no tenderness.  Moving air well in all lung fields.  Lymphadenopathy:    She has no cervical adenopathy.  Neurological: She is alert.  Skin: No abrasion,  no petechiae and no rash noted. Rash is not papular, not vesicular and not urticarial. No erythema. No pallor.  Psychiatric: She has a normal mood and affect.     Diagnostic studies:    Spirometry: results normal (FEV1: 2.46/105%, FVC: 3.22/118%, FEV1/FVC: 76%).    Spirometry consistent with normal pattern.   Allergy Studies: none      Salvatore Marvel, MD  [facility] and [facility] [place]

## 2019-03-25 NOTE — Addendum Note (Signed)
Addended by: Chip Boer R on: 03/25/2019 03:11 PM   Modules accepted: Orders

## 2019-03-30 ENCOUNTER — Ambulatory Visit (INDEPENDENT_AMBULATORY_CARE_PROVIDER_SITE_OTHER): Payer: 59

## 2019-03-30 DIAGNOSIS — J3089 Other allergic rhinitis: Secondary | ICD-10-CM | POA: Diagnosis not present

## 2019-04-02 ENCOUNTER — Ambulatory Visit: Payer: 59 | Attending: Internal Medicine

## 2019-04-02 ENCOUNTER — Other Ambulatory Visit: Payer: Self-pay

## 2019-04-02 DIAGNOSIS — Z23 Encounter for immunization: Secondary | ICD-10-CM | POA: Insufficient documentation

## 2019-04-02 NOTE — Progress Notes (Signed)
   Covid-19 Vaccination Clinic  Name:  Pamela [name]    MRN: [id number] DOB: 13-Apr-1970  04/02/2019  [name] was observed post Covid-19 immunization for 15 minutes without incidence. She was provided with Vaccine Information Sheet and instruction to access the V-Safe system.   [name] was instructed to call 911 with any severe reactions post vaccine: Marland Kitchen Difficulty breathing  . Swelling of your face and throat  . A fast heartbeat  . A bad rash all over your body  . Dizziness and weakness    Immunizations Administered    Name Date Dose VIS Date Route   Moderna COVID-19 Vaccine 04/02/2019  3:39 PM 0.5 mL 01/06/2019 Intramuscular   Manufacturer: Moderna   [id number]   [id number]

## 2019-04-17 ENCOUNTER — Ambulatory Visit (INDEPENDENT_AMBULATORY_CARE_PROVIDER_SITE_OTHER): Payer: 59

## 2019-04-17 DIAGNOSIS — J3089 Other allergic rhinitis: Secondary | ICD-10-CM

## 2019-04-21 NOTE — Progress Notes (Signed)
VIALS EXP 04-20-20

## 2019-04-22 DIAGNOSIS — J3081 Allergic rhinitis due to animal (cat) (dog) hair and dander: Secondary | ICD-10-CM

## 2019-05-01 ENCOUNTER — Ambulatory Visit (INDEPENDENT_AMBULATORY_CARE_PROVIDER_SITE_OTHER): Payer: 59 | Admitting: *Deleted

## 2019-05-01 DIAGNOSIS — J309 Allergic rhinitis, unspecified: Secondary | ICD-10-CM | POA: Diagnosis not present

## 2019-05-05 ENCOUNTER — Ambulatory Visit: Payer: 59 | Attending: Family

## 2019-05-05 DIAGNOSIS — Z23 Encounter for immunization: Secondary | ICD-10-CM

## 2019-05-05 NOTE — Progress Notes (Signed)
   Covid-19 Vaccination Clinic  Name:  Pamela [name]    MRN: [id number] DOB: 07/05/1970  05/05/2019  Pamela [name] was observed post Covid-19 immunization for 15 minutes without incident. She was provided with Vaccine Information Sheet and instruction to access the V-Safe system.   Pamela [name] was instructed to call 911 with any severe reactions post vaccine: Marland Kitchen Difficulty breathing  . Swelling of face and throat  . A fast heartbeat  . A bad rash all over body  . Dizziness and weakness   Immunizations Administered    Name Date Dose VIS Date Route   Moderna COVID-19 Vaccine 05/05/2019  3:53 PM 0.5 mL 01/06/2019 Intramuscular   Manufacturer: Moderna   Lot: [id number]   [id number]

## 2019-05-14 ENCOUNTER — Ambulatory Visit (INDEPENDENT_AMBULATORY_CARE_PROVIDER_SITE_OTHER): Payer: 59

## 2019-05-14 DIAGNOSIS — J309 Allergic rhinitis, unspecified: Secondary | ICD-10-CM | POA: Diagnosis not present

## 2019-06-01 ENCOUNTER — Ambulatory Visit (INDEPENDENT_AMBULATORY_CARE_PROVIDER_SITE_OTHER): Payer: 59

## 2019-06-01 DIAGNOSIS — J309 Allergic rhinitis, unspecified: Secondary | ICD-10-CM | POA: Diagnosis not present

## 2019-06-12 ENCOUNTER — Ambulatory Visit (INDEPENDENT_AMBULATORY_CARE_PROVIDER_SITE_OTHER): Payer: 59

## 2019-06-12 DIAGNOSIS — J309 Allergic rhinitis, unspecified: Secondary | ICD-10-CM

## 2019-06-19 ENCOUNTER — Ambulatory Visit (INDEPENDENT_AMBULATORY_CARE_PROVIDER_SITE_OTHER): Payer: 59

## 2019-06-19 DIAGNOSIS — J309 Allergic rhinitis, unspecified: Secondary | ICD-10-CM

## 2019-06-29 ENCOUNTER — Ambulatory Visit (INDEPENDENT_AMBULATORY_CARE_PROVIDER_SITE_OTHER): Payer: 59

## 2019-06-29 DIAGNOSIS — J309 Allergic rhinitis, unspecified: Secondary | ICD-10-CM | POA: Diagnosis not present

## 2019-07-24 ENCOUNTER — Ambulatory Visit (INDEPENDENT_AMBULATORY_CARE_PROVIDER_SITE_OTHER): Payer: 59

## 2019-07-24 DIAGNOSIS — J309 Allergic rhinitis, unspecified: Secondary | ICD-10-CM | POA: Diagnosis not present

## 2019-07-29 ENCOUNTER — Ambulatory Visit (INDEPENDENT_AMBULATORY_CARE_PROVIDER_SITE_OTHER): Payer: 59

## 2019-07-29 DIAGNOSIS — J309 Allergic rhinitis, unspecified: Secondary | ICD-10-CM

## 2019-08-06 ENCOUNTER — Ambulatory Visit (INDEPENDENT_AMBULATORY_CARE_PROVIDER_SITE_OTHER): Payer: 59

## 2019-08-06 DIAGNOSIS — J309 Allergic rhinitis, unspecified: Secondary | ICD-10-CM | POA: Diagnosis not present

## 2019-08-17 ENCOUNTER — Ambulatory Visit (INDEPENDENT_AMBULATORY_CARE_PROVIDER_SITE_OTHER): Payer: 59

## 2019-08-17 DIAGNOSIS — J309 Allergic rhinitis, unspecified: Secondary | ICD-10-CM

## 2019-09-07 ENCOUNTER — Ambulatory Visit (INDEPENDENT_AMBULATORY_CARE_PROVIDER_SITE_OTHER): Payer: 59

## 2019-09-07 DIAGNOSIS — J309 Allergic rhinitis, unspecified: Secondary | ICD-10-CM

## 2019-10-06 ENCOUNTER — Ambulatory Visit (INDEPENDENT_AMBULATORY_CARE_PROVIDER_SITE_OTHER): Payer: 59

## 2019-10-06 DIAGNOSIS — J3089 Other allergic rhinitis: Secondary | ICD-10-CM

## 2020-01-12 ENCOUNTER — Other Ambulatory Visit: Payer: Self-pay | Admitting: Internal Medicine

## 2020-01-12 DIAGNOSIS — Z1231 Encounter for screening mammogram for malignant neoplasm of breast: Secondary | ICD-10-CM

## 2020-02-06 ENCOUNTER — Other Ambulatory Visit: Payer: Self-pay | Admitting: Allergy & Immunology

## 2020-03-01 ENCOUNTER — Ambulatory Visit: Payer: 59

## 2020-03-07 ENCOUNTER — Other Ambulatory Visit: Payer: Self-pay | Admitting: Allergy & Immunology

## 2020-03-15 ENCOUNTER — Other Ambulatory Visit: Payer: Self-pay

## 2020-03-15 ENCOUNTER — Encounter (INDEPENDENT_AMBULATORY_CARE_PROVIDER_SITE_OTHER): Payer: Self-pay | Admitting: Family Medicine

## 2020-03-15 ENCOUNTER — Ambulatory Visit (INDEPENDENT_AMBULATORY_CARE_PROVIDER_SITE_OTHER): Payer: BC Managed Care – PPO | Admitting: Family Medicine

## 2020-03-15 VITALS — BP 147/86 | HR 86 | Temp 98.9°F | Ht 65.0 in | Wt 333.0 lb

## 2020-03-15 DIAGNOSIS — Z9189 Other specified personal risk factors, not elsewhere classified: Secondary | ICD-10-CM

## 2020-03-15 DIAGNOSIS — K219 Gastro-esophageal reflux disease without esophagitis: Secondary | ICD-10-CM

## 2020-03-15 DIAGNOSIS — R5383 Other fatigue: Secondary | ICD-10-CM

## 2020-03-15 DIAGNOSIS — R7303 Prediabetes: Secondary | ICD-10-CM | POA: Diagnosis not present

## 2020-03-15 DIAGNOSIS — I10 Essential (primary) hypertension: Secondary | ICD-10-CM

## 2020-03-15 DIAGNOSIS — K588 Other irritable bowel syndrome: Secondary | ICD-10-CM

## 2020-03-15 DIAGNOSIS — Z1331 Encounter for screening for depression: Secondary | ICD-10-CM | POA: Diagnosis not present

## 2020-03-15 DIAGNOSIS — F908 Attention-deficit hyperactivity disorder, other type: Secondary | ICD-10-CM

## 2020-03-15 DIAGNOSIS — E65 Localized adiposity: Secondary | ICD-10-CM | POA: Diagnosis not present

## 2020-03-15 DIAGNOSIS — J45909 Unspecified asthma, uncomplicated: Secondary | ICD-10-CM

## 2020-03-15 DIAGNOSIS — R0602 Shortness of breath: Secondary | ICD-10-CM

## 2020-03-15 DIAGNOSIS — R0683 Snoring: Secondary | ICD-10-CM

## 2020-03-15 DIAGNOSIS — Z0289 Encounter for other administrative examinations: Secondary | ICD-10-CM

## 2020-03-15 DIAGNOSIS — Z6841 Body Mass Index (BMI) 40.0 and over, adult: Secondary | ICD-10-CM

## 2020-03-15 DIAGNOSIS — F419 Anxiety disorder, unspecified: Secondary | ICD-10-CM

## 2020-03-16 DIAGNOSIS — J3081 Allergic rhinitis due to animal (cat) (dog) hair and dander: Secondary | ICD-10-CM | POA: Diagnosis not present

## 2020-03-16 LAB — LIPID PANEL
Chol/HDL Ratio: 3.2 ratio (ref 0.0–4.4)
Cholesterol, Total: 213 mg/dL — ABNORMAL HIGH (ref 100–199)
HDL: 66 mg/dL (ref >39)
LDL Chol Calc (NIH): 128 mg/dL — ABNORMAL HIGH (ref 0–99)
Triglycerides: 106 mg/dL (ref 0–149)
VLDL Cholesterol Cal: 19 mg/dL (ref 5–40)

## 2020-03-16 LAB — ANEMIA PANEL
Ferritin: 16 ng/mL (ref 15–150)
Folate, Hemolysate: 341 ng/mL
Folate, RBC: 909 ng/mL (ref >498)
Hematocrit: 37.5 % (ref 34.0–46.6)
Iron Saturation: 9 % — CL (ref 15–55)
Iron: 33 ug/dL (ref 27–159)
Retic Ct Pct: 1.9 % (ref 0.6–2.6)
Total Iron Binding Capacity: 374 ug/dL (ref 250–450)
UIBC: 341 ug/dL (ref 131–425)
Vitamin B-12: 234 pg/mL (ref 232–1245)

## 2020-03-16 LAB — INSULIN, RANDOM: INSULIN: 30 u[IU]/mL — ABNORMAL HIGH (ref 2.6–24.9)

## 2020-03-16 LAB — COMPREHENSIVE METABOLIC PANEL
ALT: 11 IU/L (ref 0–32)
AST: 11 IU/L (ref 0–40)
Albumin/Globulin Ratio: 1.2 (ref 1.2–2.2)
Albumin: 4 g/dL (ref 3.8–4.8)
Alkaline Phosphatase: 80 IU/L (ref 44–121)
BUN/Creatinine Ratio: 10 (ref 9–23)
BUN: 10 mg/dL (ref 6–24)
Bilirubin Total: 0.2 mg/dL (ref 0.0–1.2)
CO2: 24 mmol/L (ref 20–29)
Calcium: 9.1 mg/dL (ref 8.7–10.2)
Chloride: 100 mmol/L (ref 96–106)
Creatinine, Ser: 0.96 mg/dL (ref 0.57–1.00)
GFR calc Af Amer: 80 mL/min/{1.73_m2} (ref >59)
GFR calc non Af Amer: 69 mL/min/{1.73_m2} (ref >59)
Globulin, Total: 3.3 g/dL (ref 1.5–4.5)
Glucose: 121 mg/dL — ABNORMAL HIGH (ref 65–99)
Potassium: 3.6 mmol/L (ref 3.5–5.2)
Sodium: 139 mmol/L (ref 134–144)
Total Protein: 7.3 g/dL (ref 6.0–8.5)

## 2020-03-16 LAB — CBC WITH DIFFERENTIAL/PLATELET
Basophils Absolute: 0.1 10*3/uL (ref 0.0–0.2)
Basos: 1 %
EOS (ABSOLUTE): 0.2 10*3/uL (ref 0.0–0.4)
Eos: 3 %
Hemoglobin: 12.2 g/dL (ref 11.1–15.9)
Immature Grans (Abs): 0 10*3/uL (ref 0.0–0.1)
Immature Granulocytes: 0 %
Lymphocytes Absolute: 1.9 10*3/uL (ref 0.7–3.1)
Lymphs: 33 %
MCH: 27.6 pg (ref 26.6–33.0)
MCHC: 32.5 g/dL (ref 31.5–35.7)
MCV: 85 fL (ref 79–97)
Monocytes Absolute: 0.4 10*3/uL (ref 0.1–0.9)
Monocytes: 7 %
Neutrophils Absolute: 3.2 10*3/uL (ref 1.4–7.0)
Neutrophils: 56 %
Platelets: 313 10*3/uL (ref 150–450)
RBC: 4.42 x10E6/uL (ref 3.77–5.28)
RDW: 15.3 % (ref 11.7–15.4)
WBC: 5.8 10*3/uL (ref 3.4–10.8)

## 2020-03-16 LAB — HEMOGLOBIN A1C
Est. average glucose Bld gHb Est-mCnc: 137 mg/dL
Hgb A1c MFr Bld: 6.4 % — ABNORMAL HIGH (ref 4.8–5.6)

## 2020-03-16 LAB — T4, FREE: Free T4: 1.24 ng/dL (ref 0.82–1.77)

## 2020-03-16 LAB — VITAMIN D 25 HYDROXY (VIT D DEFICIENCY, FRACTURES): Vit D, 25-Hydroxy: 22.9 ng/mL — ABNORMAL LOW (ref 30.0–100.0)

## 2020-03-16 LAB — TSH: TSH: 2.17 u[IU]/mL (ref 0.450–4.500)

## 2020-03-16 NOTE — Progress Notes (Signed)
Chief Complaint:   OBESITY Pamela [name] N Brand (MR# [id number]) is a 50 y.o. female who presents for evaluation and treatment of obesity and related comorbidities. Current BMI is Body mass index is 55.41 kg/m. Pamela [name] has been struggling with her weight for many years and has been unsuccessful in either losing weight, maintaining weight loss, or reaching her healthy weight goal.  Pamela [name] is currently in the action stage of change and ready to dedicate time achieving and maintaining a healthier weight. Pamela [name] is interested in becoming our patient and working on intensive lifestyle modifications including (but not limited to) diet and exercise for weight loss.  Pamela [name] craves savory foods.  She says she likes to cook. She is lactose and soy intolerant.  Aarini's habits were reviewed today and are as follows: she struggles with family and or coworkers weight loss sabotage, her desired weight loss is 143 pounds, she has been heavy most of her life, she started gaining weight after childbirth, her heaviest weight ever was 333 pounds, she craves savory meals and soul food, she skips breakfast/lunch frequently, she is frequently drinking liquids with calories, she frequently makes poor food choices, she frequently eats larger portions than normal and she struggles with emotional eating.  Depression Screen Lashina's Food and Mood (modified PHQ-9) score was 16.  Depression screen [facility] 2/9 03/15/2020  Decreased Interest 3  Down, Depressed, Hopeless 1  PHQ - 2 Score 4  Altered sleeping 3  Tired, decreased energy 3  Change in appetite 3  Feeling bad or failure about yourself  2  Trouble concentrating 1  Moving slowly or fidgety/restless 0  Suicidal thoughts 0  PHQ-9 Score 16  Difficult doing work/chores Somewhat difficult   Assessment/Plan:   1. Other fatigue Pamela [name] admits to daytime somnolence and reports waking up still tired. Patent has a history of symptoms of daytime fatigue, morning fatigue and  snoring. Pamela [name] generally gets 6-8 hours of sleep per night, and states that she has generally restful sleep. Snoring is present. Apneic episodes are present. Epworth Sleepiness Score is 8.  Pamela [name] does feel that her weight is causing her energy to be lower than it should be. Fatigue may be related to obesity, depression or many other causes. Labs will be ordered, and in the meanwhile, Pamela [name] will focus on self care including making healthy food choices, increasing physical activity and focusing on stress reduction.  - EKG 12-Lead - Anemia panel - CBC with Differential/Platelet - Comprehensive metabolic panel - Hemoglobin A1c - Lipid panel - Vitamin B12 - TSH - T4, free - VITAMIN D 25 Hydroxy (Vit-D Deficiency, Fractures) - Insulin, random  2. SOB (shortness of breath) on exertion Pamela [name] notes increasing shortness of breath with exercising and seems to be worsening over time with weight gain. She notes getting out of breath sooner with activity than she used to. This has gotten worse recently. Pamela [name] denies shortness of breath at rest or orthopnea.  Pamela [name] does feel that she gets out of breath more easily that she used to when she exercises. Carmine's shortness of breath appears to be obesity related and exercise induced. She has agreed to work on weight loss and gradually increase exercise to treat her exercise induced shortness of breath. Will continue to monitor closely.  - Anemia panel - CBC with Differential/Platelet - Comprehensive metabolic panel - Hemoglobin A1c - Lipid panel - Vitamin B12 - TSH - T4, free - VITAMIN D 25 Hydroxy (Vit-D Deficiency, Fractures) - Insulin, random  3. Visceral  obesity Current visceral fat rating: 24. Visceral fat rating should be < 13. Visceral adipose tissue is a hormonally active component of total body fat. This body composition phenotype is associated with medical disorders such as metabolic syndrome, cardiovascular disease and several malignancies  including prostate, breast, and colorectal cancers. Starting goal: Lose 7-10% of starting weight.   4. Essential hypertension Elevated today.  Medications: losartan/HCTZ 100-12.5 mg daily.   Plan: Avoid buying foods that are: processed, frozen, or prepackaged to avoid excess salt. We will continue to monitor closely alongside her PCP and/or Specialist.  Regular follow up with PCP and specialists was also encouraged.   BP Readings from Last 3 Encounters:  03/15/20 (!) 147/86  03/24/19 130/76  09/09/18 (!) 186/112   Lab Results  Component Value Date   CREATININE 0.96 03/15/2020   5. Gastroesophageal reflux disease, unspecified whether esophagitis present Pamela [name] takes Protonix 40 mg daily for GERD.  We reviewed the diagnosis of GERD and importance of treatment. We discussed "red flag" symptoms and the importance of follow up if symptoms persisted despite treatment. We reviewed non-pharmacologic management of GERD symptoms: including: caffeine reduction, dietary changes, elevate HOB, NPO after supper, reduction of alcohol intake, tobacco cessation, and weight loss.  6. 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  Will continue to follow.  7. Other irritable bowel syndrome The cause is not completely understood, but is likely to be a combination of genetics, diet, stress, visceral hypersensitivity and the gut microbiome.  The available treatments aim to control symptoms even if unable to "cure" the condition.  While not physically harmful, IBS can have a significant impact on quality of life.  8. Prediabetes Not at goal. Goal is HgbA1c < 5.7.  Medication: None.    Plan:  She will continue to focus on protein-rich, low simple carbohydrate foods. We reviewed the importance of hydration, regular exercise for stress reduction, and restorative sleep.   Lab Results   Component Value Date   HGBA1C 6.4 (H) 03/15/2020   Lab Results  Component Value Date   INSULIN 30.0 (H) 03/15/2020   9. Snores Pamela [name] endorses snoring and apneic episodes.  Epworth score is 8.    Plan:  Will place referral to Sleep Medicine for evaluation/treatment.  - Ambulatory referral to Sleep Studies  10. Attention deficit hyperactivity disorder (ADHD), other type Pamela [name] says that she used to take Vyvanse.  She takes it during "seasons" like grad school.  It increased her blood pressure.  11. Anxiety, with emotional eating Behavior modification techniques were discussed today to help Pamela [name] deal with her anxiety.  Orders and follow up as documented in patient record.   12. Depression screening Pamela [name] was screened for depression as part of her new patient workup today.  PHQ-9 is 16.  13. At risk for heart disease Due to Cyd's current state of health and medical condition(s), she is at a higher risk for heart disease.  This puts the patient at much greater risk to subsequently develop cardiopulmonary conditions that can significantly affect patient's quality of life in a negative manner.    At least 9 minutes were spent on counseling Pamela [name] about these concerns today, and I stressed the importance of reversing risks factors of obesity, especially truncal and visceral fat, hypertension, hyperlipidemia, and pre-diabetes.  The initial goal is to lose at least 5-10% of starting weight to help reduce these risk factors.  Counseling:  Intensive lifestyle  modifications were discussed with Pamela [name] as the most appropriate first line of treatment.  she will continue to work on diet, exercise, and weight loss efforts.  We will continue to reassess these conditions on a fairly regular basis in an attempt to decrease the patient's overall morbidity and mortality.  Evidence-based interventions for health behavior change were utilized today including the discussion of self monitoring techniques,  problem-solving barriers, and SMART goal setting techniques.  Specifically, regarding patient's less desirable eating habits and patterns, we employed the technique of small changes when Pamela [name] has not been able to fully commit to her prudent nutritional plan.  14. Class 3 severe obesity with serious comorbidity and body mass index (BMI) of 50.0 to 59.9 in adult, unspecified obesity type ([place])  Pamela [name] is currently in the action stage of change and her goal is to continue with weight loss efforts. I recommend Pamela [name] begin the structured treatment plan as follows:  She has agreed to the Category 4 Plan.  Exercise goals: No exercise has been prescribed at this time.   Behavioral modification strategies: increasing lean protein intake, decreasing simple carbohydrates, increasing vegetables, increasing water intake, decreasing liquid calories, decreasing alcohol intake, decreasing sodium intake and increasing high fiber foods.  She was informed of the importance of frequent follow-up visits to maximize her success with intensive lifestyle modifications for her multiple health conditions. She was informed we would discuss her lab results at her next visit unless there is a critical issue that needs to be addressed sooner. Pamela [name] agreed to keep her next visit at the agreed upon time to discuss these results.  Objective:   Blood pressure (!) 147/86, pulse 86, temperature 98.9 F (37.2 C), temperature source Oral, height 5\' 5"  (1.651 m), weight (!) 333 lb (151 kg), last menstrual period 03/05/2020, SpO2 97 %. Body mass index is 55.41 kg/m.  EKG: Normal sinus rhythm, rate 86 bpm.  Indirect Calorimeter completed today shows a VO2 of 358 and a REE of 2490.  Her calculated basal metabolic rate is 2094 thus her basal metabolic rate is better than expected.  General: Cooperative, alert, well developed, in no acute distress. HEENT: Conjunctivae and lids unremarkable. Cardiovascular: Regular rhythm.  Lungs:  Normal work of breathing. Neurologic: No focal deficits.   Lab Results  Component Value Date   CREATININE 0.96 03/15/2020   BUN 10 03/15/2020   NA 139 03/15/2020   K 3.6 03/15/2020   CL 100 03/15/2020   CO2 24 03/15/2020   Lab Results  Component Value Date   ALT 11 03/15/2020   AST 11 03/15/2020   ALKPHOS 80 03/15/2020   BILITOT 0.2 03/15/2020   Lab Results  Component Value Date   HGBA1C 6.4 (H) 03/15/2020   Lab Results  Component Value Date   INSULIN 30.0 (H) 03/15/2020   Lab Results  Component Value Date   TSH 2.170 03/15/2020   Lab Results  Component Value Date   CHOL 213 (H) 03/15/2020   HDL 66 03/15/2020   LDLCALC 128 (H) 03/15/2020   TRIG 106 03/15/2020   CHOLHDL 3.2 03/15/2020   Lab Results  Component Value Date   WBC 5.8 03/15/2020   HGB 12.2 03/15/2020   HCT 37.5 03/15/2020   MCV 85 03/15/2020   PLT 313 03/15/2020   Lab Results  Component Value Date   IRON 33 03/15/2020   TIBC 374 03/15/2020   FERRITIN 16 03/15/2020   Attestation Statements:   This is the patient's first visit at [facility]. The patient's NEW PATIENT PACKET was reviewed at length. Included in the packet: current and past health history, medications, allergies, ROS, gynecologic history (women only), surgical history, family history, social history, weight history, weight loss surgery history (for those that have had weight loss surgery), nutritional evaluation, mood and food questionnaire, PHQ9, Epworth questionnaire, sleep habits questionnaire, patient life and health improvement goals questionnaire. These will all be scanned into the patient's chart under media.   During the visit, I independently reviewed the patient's EKG, bioimpedance scale results, and indirect calorimeter results. I used this information to tailor a meal plan for the patient that will help her to lose weight and will improve her obesity-related conditions going forward. I performed a medically  necessary appropriate examination and/or evaluation. I discussed the assessment and treatment plan with the patient. The patient was provided an opportunity to ask questions and all were answered. The patient agreed with the plan and demonstrated an understanding of the instructions. Labs were ordered at this visit and will be reviewed at the next visit unless more critical results need to be addressed immediately. Clinical information was updated and documented in the EMR.   I, Water quality scientist, CMA, am acting as transcriptionist for Briscoe Deutscher, DO  I have reviewed the above documentation for accuracy and completeness, and I agree with the above. Briscoe Deutscher, DO

## 2020-03-16 NOTE — Progress Notes (Signed)
VIALS EXP 03-16-21

## 2020-03-25 ENCOUNTER — Other Ambulatory Visit: Payer: Self-pay

## 2020-03-25 ENCOUNTER — Ambulatory Visit: Payer: BC Managed Care – PPO | Admitting: Family Medicine

## 2020-03-25 ENCOUNTER — Encounter: Payer: Self-pay | Admitting: Family Medicine

## 2020-03-25 VITALS — BP 128/92 | HR 85 | Temp 97.2°F | Resp 18 | Ht 65.0 in

## 2020-03-25 DIAGNOSIS — K219 Gastro-esophageal reflux disease without esophagitis: Secondary | ICD-10-CM

## 2020-03-25 DIAGNOSIS — J4530 Mild persistent asthma, uncomplicated: Secondary | ICD-10-CM | POA: Diagnosis not present

## 2020-03-25 DIAGNOSIS — J3089 Other allergic rhinitis: Secondary | ICD-10-CM | POA: Diagnosis not present

## 2020-03-25 MED ORDER — MONTELUKAST SODIUM 10 MG PO TABS: ORAL_TABLET | ORAL | 5 refills | Status: DC

## 2020-03-25 NOTE — Progress Notes (Signed)
[address] [place] [address] Dept: [phone]  FOLLOW UP NOTE  Patient ID: Pamela N Norman, female    DOB: 05/12/1970  Age: 50 y.o. MRN: [id number] Date of Office Visit: 03/25/2020  Assessment  Chief Complaint: Asthma (ACT -22 /Singular helps at night with the wheezing ) and Allergic Rhinitis  (Well controlled  - had one bad episode on new years due to cat dander )  HPI Pamela [name] is a 50 year old female who presents to the clinic for follow-up visit.  She was last seen in this clinic on 03/24/2019 by Dr. Ernst Bowler for evaluation of asthma and allergic rhinitis.  At today's visit she reports that she has stopped receiving allergy injections in November 2021.  She reports allergic rhinitis has been well controlled with the exception of when she is near cats or dogs.  Her symptoms on exposure to cat include sore throat, clear rhinorrhea, generalized pruritus, and itchy watery eyes.  She reports moderate relief of the symptoms with cetirizine 10 mg.  She continues cetirizine 10 mg once a day and is not currently using Flonase or nasal saline rinse.  Asthma is reported as well controlled with  shortness of breath, cough, and wheeze only occurring with exposure to cat and to a lesser extent with dogs.  Without exposure to pets she does not have shortness of breath, cough, or wheeze.  She continues montelukast 10 mg once a day and has not used albuterol since her last visit to this clinic.  Reflux is reported as well controlled with pantoprazole daily.  Her current medications are listed in the chart.   Drug Allergies:  No Known Allergies  Physical Exam: BP (!) 128/92   Pulse 85   Temp (!) 97.2 F (36.2 C)   Resp 18   Ht 5\' 5"  (1.651 m)   LMP 03/05/2020   SpO2 97%   BMI 55.41 kg/m    Physical Exam Vitals reviewed.  Constitutional:      Appearance: Normal appearance.  HENT:     Head: Normocephalic and atraumatic.     Right Ear: Tympanic membrane normal.      Left Ear: Tympanic membrane normal.     Nose:     Comments: Bilateral nares slightly erythematous with clear nasal drainage noted.  Pharynx normal.  Ears normal.  Eyes normal.    Mouth/Throat:     Pharynx: Oropharynx is clear.  Eyes:     Conjunctiva/sclera: Conjunctivae normal.  Cardiovascular:     Rate and Rhythm: Normal rate and regular rhythm.     Heart sounds: Normal heart sounds. No murmur heard.   Pulmonary:     Effort: Pulmonary effort is normal.     Breath sounds: Normal breath sounds.     Comments: Lungs clear to auscultation Musculoskeletal:        General: Normal range of motion.     Cervical back: Normal range of motion and neck supple.  Skin:    General: Skin is warm and dry.  Neurological:     Mental Status: She is alert and oriented to person, place, and time.  Psychiatric:        Mood and Affect: Mood normal.        Behavior: Behavior normal.        Thought Content: Thought content normal.        Judgment: Judgment normal.     Diagnostics: FVC 3.31, FEV1 2.53.  Predicted FVC 3.14, predicted FEV1 2.52.  Spirometry indicates normal  ventilatory function.  Assessment and Plan: 1. Mild persistent asthma, uncomplicated   2. Perennial allergic rhinitis   3. Gastroesophageal reflux disease, unspecified whether esophagitis present     Meds ordered this encounter  Medications  . montelukast (SINGULAIR) 10 MG tablet    Sig: TAKE 1 TABLET(10 MG) BY MOUTH AT BEDTIME    Dispense:  30 tablet    Refill:  5    Patient Instructions  Asthma Continue montelukast 10 mg once a day to prevent cough or wheeze Continue albuterol 2 puffs once every 4 hours as needed for cough or wheeze You may use albuterol 2 puffs 5 to 15 minutes before activity to prevent cough or wheeze  Allergic rhinitis Continue allergen avoidance measures directed toward mold, cat, and dog as listed below Continue cetirizine 10 mg once a day as needed for runny nose. You may take an additional dose  if you have breakthrough symptoms. Remember to rotate to a different antihistamine about every 3 months. Some examples of over the counter antihistamines include Zyrtec (cetirizine), Xyzal (levocetirizine), Allegra (fexofenadine), and Claritin (loratidine).  Continue Flonase 1 spray in each nostril once a day as needed for stuffy nose.  In the right nostril, point the applicator out toward the right ear. In the left nostril, point the applicator out toward the left ear Consider saline nasal rinses as needed for nasal symptoms. Use this before any medicated nasal sprays for best result  Reflux Continue dietary lifestyle modifications as listed below Continue pantoprazole 40 mg once a day as previously prescribed  Call the clinic if this treatment plan is not working well for you  Follow up in 6 months or sooner if needed.   Return in about 6 months (around 09/22/2020), or if symptoms worsen or fail to improve.    Thank you for the opportunity to care for this patient.  Please do not hesitate to contact me with questions.  Gareth Morgan, FNP [facility]

## 2020-03-25 NOTE — Patient Instructions (Signed)
Asthma Continue montelukast 10 mg once a day to prevent cough or wheeze Continue albuterol 2 puffs once every 4 hours as needed for cough or wheeze You may use albuterol 2 puffs 5 to 15 minutes before activity to prevent cough or wheeze  Allergic rhinitis Continue allergen avoidance measures directed toward mold, cat, and dog as listed below Continue cetirizine 10 mg once a day as needed for runny nose. You may take an additional dose if you have breakthrough symptoms. Remember to rotate to a different antihistamine about every 3 months. Some examples of over the counter antihistamines include Zyrtec (cetirizine), Xyzal (levocetirizine), Allegra (fexofenadine), and Claritin (loratidine).  Continue Flonase 1 spray in each nostril once a day as needed for stuffy nose.  In the right nostril, point the applicator out toward the right ear. In the left nostril, point the applicator out toward the left ear Consider saline nasal rinses as needed for nasal symptoms. Use this before any medicated nasal sprays for best result  Reflux Continue dietary lifestyle modifications as listed below Continue pantoprazole 40 mg once a day as previously prescribed  Call the clinic if this treatment plan is not working well for you  Follow up in 6 months or sooner if needed.  Control of Mold Allergen Mold and fungi can grow on a variety of surfaces provided certain temperature and moisture conditions exist.  Outdoor molds grow on plants, decaying vegetation and soil.  The major outdoor mold, Alternaria and Cladosporium, are found in very high numbers during hot and dry conditions.  Generally, a late Summer - Fall peak is seen for common outdoor fungal spores.  Rain will temporarily lower outdoor mold spore count, but counts rise rapidly when the rainy period ends.  The most important indoor molds are Aspergillus and Penicillium.  Dark, humid and poorly ventilated basements are ideal sites for mold growth.  The next most  common sites of mold growth are the bathroom and the kitchen.  Outdoor [employer] 1. Use air conditioning and keep windows closed 2. Avoid exposure to decaying vegetation. 3. Avoid leaf raking. 4. Avoid grain handling. 5. Consider wearing a face mask if working in moldy areas.  Indoor Mold Control 1. Maintain humidity below 50%. 2. Clean washable surfaces with 5% bleach solution. 3. Remove sources e.g. Contaminated carpets.  Control of Dog or Cat Allergen Avoidance is the best way to manage a dog or cat allergy. If you have a dog or cat and are allergic to dog or cats, consider removing the dog or cat from the home. If you have a dog or cat but don't want to find it a new home, or if your family wants a pet even though someone in the household is allergic, here are some strategies that may help keep symptoms at bay:  6. Keep the pet out of your bedroom and restrict it to only a few rooms. Be advised that keeping the dog or cat in only one room will not limit the allergens to that room. 7. Don't pet, hug or kiss the dog or cat; if you do, wash your hands with soap and water. 8. High-efficiency particulate air (HEPA) cleaners run continuously in a bedroom or living room can reduce allergen levels over time. 9. Regular use of a high-efficiency vacuum cleaner or a central vacuum can reduce allergen levels. 10. Giving your dog or cat a bath at least once a week can reduce airborne allergen.   Lifestyle Changes for Controlling GERD When you  have GERD, stomach acid feels as if it's backing up toward your mouth. Whether or not you take medication to control your GERD, your symptoms can often be improved with lifestyle changes.   Raise Your Head  Reflux is more likely to strike when you're lying down flat, because stomach fluid can  flow backward more easily. Raising the head of your bed 4-6 inches can help. To do this:  Slide blocks or books under the legs at the head of your bed. Or,  place a wedge under  the mattress. Many foam stores can make a suitable wedge for you. The wedge  should run from your waist to the top of your head.  Don't just prop your head on several pillows. This increases pressure on your  stomach. It can make GERD worse.  Watch Your Eating Habits Certain foods may increase the acid in your stomach or relax the lower esophageal sphincter, making GERD more likely. It's best to avoid the following:  Coffee, tea, and carbonated drinks (with and without caffeine)  Fatty, fried, or spicy food  Mint, chocolate, onions, and tomatoes  Any other foods that seem to irritate your stomach or cause you pain  Relieve the Pressure  Eat smaller meals, even if you have to eat more often.  Don't lie down right after you eat. Wait a few hours for your stomach to empty.  Avoid tight belts and tight-fitting clothes.  Lose excess weight.  Tobacco and Alcohol  Avoid smoking tobacco and drinking alcohol. They can make GERD symptoms worse.

## 2020-03-29 ENCOUNTER — Ambulatory Visit (INDEPENDENT_AMBULATORY_CARE_PROVIDER_SITE_OTHER): Payer: BC Managed Care – PPO | Admitting: Family Medicine

## 2020-03-29 ENCOUNTER — Other Ambulatory Visit: Payer: Self-pay

## 2020-03-29 ENCOUNTER — Encounter (INDEPENDENT_AMBULATORY_CARE_PROVIDER_SITE_OTHER): Payer: Self-pay | Admitting: Family Medicine

## 2020-03-29 VITALS — BP 155/78 | HR 71 | Temp 98.5°F | Ht 65.0 in | Wt 326.0 lb

## 2020-03-29 DIAGNOSIS — R790 Abnormal level of blood mineral: Secondary | ICD-10-CM

## 2020-03-29 DIAGNOSIS — I10 Essential (primary) hypertension: Secondary | ICD-10-CM | POA: Diagnosis not present

## 2020-03-29 DIAGNOSIS — E7800 Pure hypercholesterolemia, unspecified: Secondary | ICD-10-CM

## 2020-03-29 DIAGNOSIS — Z6841 Body Mass Index (BMI) 40.0 and over, adult: Secondary | ICD-10-CM

## 2020-03-29 DIAGNOSIS — Z9189 Other specified personal risk factors, not elsewhere classified: Secondary | ICD-10-CM | POA: Diagnosis not present

## 2020-03-29 DIAGNOSIS — R7303 Prediabetes: Secondary | ICD-10-CM

## 2020-03-29 DIAGNOSIS — E559 Vitamin D deficiency, unspecified: Secondary | ICD-10-CM

## 2020-03-29 MED ORDER — VITAMIN D (ERGOCALCIFEROL) 1.25 MG (50000 UNIT) PO CAPS: 50000.0000 [IU] | ORAL_CAPSULE | ORAL | 0 refills | Status: DC

## 2020-03-30 NOTE — Telephone Encounter (Signed)
Dr.Wallace °

## 2020-03-31 NOTE — Progress Notes (Signed)
Chief Complaint:   OBESITY Pamela [name] is here to discuss her progress with her obesity treatment plan along with follow-up of her obesity related diagnoses.   Today's visit was #: 2 Starting weight: 333 lbs Starting date: 03/15/2020 Today's weight: 326 lbs Today's date: 03/29/2020 Total lbs lost to date: 7 lbs Body mass index is 54.25 kg/m.  Total weight loss percentage to date: -2.10%  Interim History:  Pamela [name] says that breakfast is her favorite meal.  She is very happy with the plan, she says.  She is feeling much less swollen.  Sleeping better.  Current Meal Plan: the Category 4 Plan for 100% of the time.  Current Exercise Plan: None at this time.  Assessment/Plan:   1. Low ferritin Nutrition: Iron-rich foods include dark leafy greens, red and white meats, eggs, seafood, and beans.  Certain foods and drinks prevent your body from absorbing iron properly. Avoid eating these foods in the same meal as iron-rich foods or with iron supplements. These foods include: coffee, black tea, and red wine; milk, dairy products, and foods that are high in calcium; beans and soybeans; whole grains. Constipation can be a side effect of iron supplementation. Increased water and fiber intake are helpful. Water goal: > 2 liters/day. Fiber goal: > 25 grams/day.  Plan:  Recommend OTC iron every other day.  2. Prediabetes Not at goal. Goal is HgbA1c < 5.7.  Medication: None.    Plan:  She will continue to focus on protein-rich, low simple carbohydrate foods. We reviewed the importance of hydration, regular exercise for stress reduction, and restorative sleep.   Lab Results  Component Value Date   HGBA1C 6.4 (H) 03/15/2020   Lab Results  Component Value Date   INSULIN 30.0 (H) 03/15/2020   3. Pure hypercholesterolemia Course: Not at goal. Lipid-lowering medications: None.   Plan: Dietary changes: Increase soluble fiber, decrease simple carbohydrates, decrease saturated fat. Exercise changes:  Moderate to vigorous-intensity aerobic activity 150 minutes per week or as tolerated. We will continue to monitor along with PCP/specialists as it pertains to her weight loss journey.  Lab Results  Component Value Date   CHOL 213 (H) 03/15/2020   HDL 66 03/15/2020   LDLCALC 128 (H) 03/15/2020   TRIG 106 03/15/2020   CHOLHDL 3.2 03/15/2020   Lab Results  Component Value Date   ALT 11 03/15/2020   AST 11 03/15/2020   ALKPHOS 80 03/15/2020   BILITOT 0.2 03/15/2020   The 10-year ASCVD risk score Mikey Bussing DC Jr., et al., 2013) is: 11.9%   Values used to calculate the score:     Age: 50 years     Sex: Female     Is Non-Hispanic African American: Yes     Diabetic: No     Tobacco smoker: Yes     Systolic Blood Pressure: 462 mmHg     Is BP treated: Yes     HDL Cholesterol: 66 mg/dL     Total Cholesterol: 213 mg/dL  4. Essential hypertension Elevated today.  Medications: Hyzaar 100-12.5 mg daily.   Plan: Avoid buying foods that are: processed, frozen, or prepackaged to avoid excess salt. We will continue to monitor closely alongside her PCP and/or Specialist.  Regular follow up with PCP and specialists was also encouraged.   BP Readings from Last 3 Encounters:  03/29/20 (!) 155/78  03/25/20 (!) 128/92  03/15/20 (!) 147/86   Lab Results  Component Value Date   CREATININE 0.96 03/15/2020   5. Vitamin  D deficiency Not at goal. Current vitamin D is 22.9, tested on 03/15/2020. Optimal goal > 50 ng/dL.   Plan:  Start to take prescription Vitamin D @50 ,000 IU every week as prescribed.  Follow-up for routine testing of Vitamin D, at least 2-3 times per year to avoid over-replacement.  - Start Vitamin D, Ergocalciferol, (DRISDOL) 1.25 MG (50000 UNIT) CAPS capsule; Take 1 capsule (50,000 Units total) by mouth every 7 (seven) days.  Dispense: 12 capsule; Refill: 0  6. At risk for heart disease Due to [facility] current state of health and medical condition(s), she is at a higher risk for heart  disease.  This puts the patient at much greater risk to subsequently develop cardiopulmonary conditions that can significantly affect patient's quality of life in a negative manner.    At least 9 minutes were spent on counseling Pamela [name] about these concerns today. Counseling:  Intensive lifestyle modifications were discussed with Pamela [name] as the most appropriate first line of treatment.  she will continue to work on diet, exercise, and weight loss efforts.  We will continue to reassess these conditions on a fairly regular basis in an attempt to decrease the patient's overall morbidity and mortality.  Evidence-based interventions for health behavior change were utilized today including the discussion of self monitoring techniques, problem-solving barriers, and SMART goal setting techniques.  Specifically, regarding patient's less desirable eating habits and patterns, we employed the technique of small changes when Pamela [name] has not been able to fully commit to her prudent nutritional plan.  7. Class 3 severe obesity with serious comorbidity and body mass index (BMI) of 50.0 to 59.9 in adult, unspecified obesity type [facility])  Course: Pamela [name] is currently in the action stage of change. As such, her goal is to continue with weight loss efforts.   Nutrition goals: She has agreed to the Category 4 Plan.   Exercise goals: "Get Started" onlin program and walk daily.  Behavioral modification strategies: increasing lean protein intake, decreasing simple carbohydrates, increasing vegetables, increasing water intake, decreasing liquid calories and decreasing alcohol intake.  Pamela [name] has agreed to follow-up with our clinic in 2 weeks. She was informed of the importance of frequent follow-up visits to maximize her success with intensive lifestyle modifications for her multiple health conditions.   Objective:   Blood pressure (!) 155/78, pulse 71, temperature 98.5 F (36.9 C), temperature source Oral, height 5\' 5"  (1.651 m),  weight (!) 326 lb (147.9 kg), last menstrual period 03/05/2020, SpO2 100 %. Body mass index is 54.25 kg/m.  General: Cooperative, alert, well developed, in no acute distress. HEENT: Conjunctivae and lids unremarkable. Cardiovascular: Regular rhythm.  Lungs: Normal work of breathing. Neurologic: No focal deficits.   Lab Results  Component Value Date   CREATININE 0.96 03/15/2020   BUN 10 03/15/2020   NA 139 03/15/2020   K 3.6 03/15/2020   CL 100 03/15/2020   CO2 24 03/15/2020   Lab Results  Component Value Date   ALT 11 03/15/2020   AST 11 03/15/2020   ALKPHOS 80 03/15/2020   BILITOT 0.2 03/15/2020   Lab Results  Component Value Date   HGBA1C 6.4 (H) 03/15/2020   Lab Results  Component Value Date   INSULIN 30.0 (H) 03/15/2020   Lab Results  Component Value Date   TSH 2.170 03/15/2020   Lab Results  Component Value Date   CHOL 213 (H) 03/15/2020   HDL 66 03/15/2020   LDLCALC 128 (H) 03/15/2020   TRIG 106 03/15/2020   CHOLHDL 3.2  03/15/2020   Lab Results  Component Value Date   WBC 5.8 03/15/2020   HGB 12.2 03/15/2020   HCT 37.5 03/15/2020   MCV 85 03/15/2020   PLT 313 03/15/2020   Lab Results  Component Value Date   IRON 33 03/15/2020   TIBC 374 03/15/2020   FERRITIN 16 03/15/2020   Attestation Statements:   Reviewed by clinician on day of visit: allergies, medications, problem list, medical history, surgical history, family history, social history, and previous encounter notes.  I, Water quality scientist, CMA, am acting as transcriptionist for Briscoe Deutscher, DO  I have reviewed the above documentation for accuracy and completeness, and I agree with the above. Briscoe Deutscher, DO

## 2020-04-12 ENCOUNTER — Encounter: Payer: Self-pay | Admitting: Neurology

## 2020-04-12 ENCOUNTER — Ambulatory Visit: Payer: BC Managed Care – PPO | Admitting: Neurology

## 2020-04-12 VITALS — BP 132/84 | HR 79 | Ht 65.0 in | Wt 327.0 lb

## 2020-04-12 DIAGNOSIS — R351 Nocturia: Secondary | ICD-10-CM

## 2020-04-12 DIAGNOSIS — R0681 Apnea, not elsewhere classified: Secondary | ICD-10-CM

## 2020-04-12 DIAGNOSIS — R0683 Snoring: Secondary | ICD-10-CM | POA: Diagnosis not present

## 2020-04-12 DIAGNOSIS — G478 Other sleep disorders: Secondary | ICD-10-CM

## 2020-04-12 DIAGNOSIS — G4719 Other hypersomnia: Secondary | ICD-10-CM

## 2020-04-12 DIAGNOSIS — G479 Sleep disorder, unspecified: Secondary | ICD-10-CM

## 2020-04-12 DIAGNOSIS — Z9189 Other specified personal risk factors, not elsewhere classified: Secondary | ICD-10-CM

## 2020-04-12 DIAGNOSIS — Z6841 Body Mass Index (BMI) 40.0 and over, adult: Secondary | ICD-10-CM

## 2020-04-12 NOTE — Patient Instructions (Signed)

## 2020-04-12 NOTE — Progress Notes (Addendum)
Subjective:    Patient ID: Pamela [name] is a 50 y.o. female.  HPI     Star Age, MD, PhD [facility] [address], [address] [address] [place], [place] [address]  Dear Dr. Juleen China,   I saw your patient, Pamela [name], upon your kind request in my sleep clinic today for initial consultation of her sleep disorder, in particular, concern for underlying obstructive sleep apnea.  The patient is unaccompanied today.  As you know, Pamela [name] is a 50 year old right-handed woman with an underlying medical history of hypertension, prediabetes, ADHD, anxiety, asthma, back pain, irritable bowel syndrome, and morbid obesity with a BMI of over 50, who reports snoring and excessive daytime somnolence.  I reviewed your office note from 03/15/2020.  Her Epworth sleepiness score is 6 out of 24 today but is likely an underestimation as she takes a nap every day and feels like she cannot function without a nap on a daily basis, may sleep longer if she does not have an alarm set or if her husband does not wake her up.  Fatigue severity score is 22 out of 63.  Her snoring has become worse as she has gained weight over time.  Her husband has noted pauses that can be 12 or 15 seconds long when she is asleep.  She has woken up rarely with a sense of gasping or abruptly with a startle.  She goes to bed around 10 and does not have trouble falling asleep or staying asleep, rise time is around 6 AM but she does not wake up fully rested.  She is a restless sleeper but denies any significant restless legs symptoms.  She has a history of sleep talking.  She is not aware of any family history of sleep apnea.  She currently works from home, she is an Tourist information centre manager.  She takes a nap typically around 2:30 PM, generally anywhere between noon and 3 PM and then schedules afternoon appointments for meetings on Zoom.  She drinks caffeine in the form of coffee, 1 cup/day on average, drinks alcohol occasionally, on  the weekends, she is a non-smoker.  She lives with her husband and 5 year old son who is getting ready to move out.  They have no pets in the household, she has several allergies including pet allergies.  She has a TV in the bedroom but does not watch TV at night.  She sleeps on her sides or stomach, not on her back typically.  Her Past Medical History Is Significant For: Past Medical History:  Diagnosis Date  . ADHD   . Anxiety   . Asthma   . Back pain   . Constipation   . GERD (gastroesophageal reflux disease)   . Hypertension   . IBS (irritable bowel syndrome)   . Joint pain   . Lower extremity edema   . Obesity   . Prediabetes     Her Past Surgical History Is Significant For: Past Surgical History:  Procedure Laterality Date  . CESAREAN SECTION  1998  . OVARIAN CYST REMOVAL    . TUBAL LIGATION  2008    Her Family History Is Significant For: Family History  Problem Relation Age of Onset  . Asthma Other   . Heart failure Other   . Hypertension Other   . Hypertension Mother   . Hypertension Father   . Heart disease Father   . Alcoholism Father     Her Social History Is Significant For: Social History  Socioeconomic History  . Marital status: Married    Spouse name: Not on file  . Number of children: Not on file  . Years of education: Not on file  . Highest education level: Not on file  Occupational History  . Occupation: Educator   Tobacco Use  . Smoking status: Never Smoker  . Smokeless tobacco: Never Used  Vaping Use  . Vaping Use: Never used  Substance and Sexual Activity  . Alcohol use: Yes    Alcohol/week: 1.0 standard drink    Types: 1 Shots of liquor per week  . Drug use: No  . Sexual activity: Yes    Birth control/protection: Surgical    Comment: tubal ligation  Other Topics Concern  . Not on file  Social History Narrative  . Not on file   Social Determinants of Health   Financial Resource Strain: Not on file  Food Insecurity: Not on  file  Transportation Needs: Not on file  Physical Activity: Not on file  Stress: Not on file  Social Connections: Not on file    Her Allergies Are:  No Known Allergies:   Her Current Medications Are:  Outpatient Encounter Medications as of 04/12/2020  Medication Sig  . cetirizine (ZYRTEC) 10 MG tablet Take 10 mg by mouth daily.  Marland Kitchen losartan-hydrochlorothiazide (HYZAAR) 100-12.5 MG tablet 100 tablets.  . montelukast (SINGULAIR) 10 MG tablet TAKE 1 TABLET(10 MG) BY MOUTH AT BEDTIME  . pantoprazole (PROTONIX) 40 MG tablet pantoprazole 40 mg tablet,delayed release  TK 1 T PO D  . Vitamin D, Ergocalciferol, (DRISDOL) 1.25 MG (50000 UNIT) CAPS capsule Take 1 capsule (50,000 Units total) by mouth every 7 (seven) days.   No facility-administered encounter medications on file as of 04/12/2020.  :   Review of Systems:  Out of a complete 14 point review of systems, all are reviewed and negative with the exception of these symptoms as listed below:  Review of Systems  Neurological:       Here for sleep consult. No prior sleep study pt reports she does snore at night.  Epworth Sleepiness Scale 0= would never doze 1= slight chance of dozing 2= moderate chance of dozing 3= high chance of dozing  Sitting and reading:0 Watching TV:0 Sitting inactive in a public place (ex. Theater or meeting):0 As a passenger in a car for an hour without a break:2 Lying down to rest in the afternoon:3 Sitting and talking to someone:0 Sitting quietly after lunch (no alcohol):1 In a car, while stopped in traffic:0 Total:6     Objective:  Neurological Exam  Physical Exam Physical Examination:   Vitals:   04/12/20 1226  BP: 132/84  Pulse: 79  SpO2: 96%    General Examination: The patient is a very pleasant 50 y.o. female in no acute distress. She appears well-developed and well-nourished and well groomed.   HEENT: Normocephalic, atraumatic, pupils are equal, round and reactive to light,  extraocular tracking is good without limitation to gaze excursion or nystagmus noted. Hearing is grossly intact. Face is symmetric with normal facial animation. Speech is clear with no dysarthria noted. There is no hypophonia. There is no lip, neck/head, jaw or voice tremor. Neck is supple with full range of passive and active motion. There are no carotid bruits on auscultation. Oropharynx exam reveals: mild mouth dryness, good dental hygiene and moderate airway crowding, due to small airway entry, tonsillar size of 1+, redundant soft palate, Mallampati class III.  Neck circumference of 17-1/2 inches.  She has  a minimal overbite.  Tongue protrudes centrally and palate elevates symmetrically.  Chest: Clear to auscultation without wheezing, rhonchi or crackles noted.  Heart: S1+S2+0, regular and normal with possible slight 1/6 systolic murmur noted.     Abdomen: Soft, non-tender and non-distended with normal bowel sounds appreciated on auscultation.  Extremities: There is no pitting edema in the distal lower extremities bilaterally.   Skin: Warm and dry without trophic changes noted.   Musculoskeletal: exam reveals no obvious joint deformities, tenderness or joint swelling or erythema.   Neurologically:  Mental status: The patient is awake, alert and oriented in all 4 spheres. Her immediate and remote memory, attention, language skills and fund of knowledge are appropriate. There is no evidence of aphasia, agnosia, apraxia or anomia. Speech is clear with normal prosody and enunciation. Thought process is linear. Mood is normal and affect is normal.  Cranial nerves II - XII are as described above under HEENT exam.  Motor exam: Normal bulk, strength and tone is noted. There is no tremor, Romberg is negative. Fine motor skills and coordination: grossly intact.  Cerebellar testing: No dysmetria or intention tremor. There is no truncal or gait ataxia.  Sensory exam: intact to light touch in the upper and  lower extremities.  Gait, station and balance: She stands easily. No veering to one side is noted. No leaning to one side is noted. Posture is age-appropriate and stance is narrow based. Gait shows normal stride length and normal pace. No problems turning are noted. Tandem walk is slightly challenging in the beginning.              Assessment and Plan:   In summary, Pamela [name] is a very pleasant 50 y.o.-year old female with an underlying medical history of hypertension, prediabetes, ADHD, anxiety, asthma, back pain, irritable bowel syndrome, and morbid obesity with a BMI of over 50, whose history and physical exam are concerning for obstructive sleep apnea (OSA). I had a long chat with the patient about my findings and the diagnosis of OSA, its prognosis and treatment options. We talked about medical treatments, surgical interventions and non-pharmacological approaches. I explained in particular the risks and ramifications of untreated moderate to severe OSA, especially with respect to developing cardiovascular disease down the Road, including congestive heart failure, difficult to treat hypertension, cardiac arrhythmias, or stroke. Even type 2 diabetes has, in part, been linked to untreated OSA. Symptoms of untreated OSA include daytime sleepiness, memory problems, mood irritability and mood disorder such as depression and anxiety, lack of energy, as well as recurrent headaches, especially morning headaches. We talked about trying to maintain a healthy lifestyle in general, as well as the importance of weight control. We also talked about the importance of good sleep hygiene. I recommended the following at this time: sleep study.  She may have a very mild systolic murmur.  She has an upcoming appointment with her primary care physician, Dr. Laurann Montana and is encouraged to have it checked out again. I explained the sleep test procedure to the patient and also outlined possible surgical and non-surgical  treatment options of OSA, including the use of a custom-made dental device (which would require a referral to a specialist dentist or oral surgeon), upper airway surgical options, such as traditional UPPP or a novel less invasive surgical option in the form of Inspire hypoglossal nerve stimulation (which would involve a referral to an ENT surgeon). I also explained the CPAP treatment option to the patient, who indicated that she would  be willing to try CPAP if the need arises. I explained the importance of being compliant with PAP treatment, not only for insurance purposes but primarily to improve Her symptoms, and for the patient's long term health benefit, including to reduce Her cardiovascular risks. I answered all her questions today and the patient was in agreement. I plan to see her back after the sleep study is completed and encouraged her to call with any interim questions, concerns, problems or updates.   Thank you very much for allowing me to participate in the care of this nice patient. If I can be of any further assistance to you please do not hesitate to call me at [phone].  Sincerely,   Star Age, MD, PhD

## 2020-04-13 ENCOUNTER — Encounter (INDEPENDENT_AMBULATORY_CARE_PROVIDER_SITE_OTHER): Payer: Self-pay

## 2020-04-13 ENCOUNTER — Telehealth: Payer: Self-pay

## 2020-04-13 ENCOUNTER — Ambulatory Visit (INDEPENDENT_AMBULATORY_CARE_PROVIDER_SITE_OTHER): Payer: BC Managed Care – PPO | Admitting: Family Medicine

## 2020-04-13 ENCOUNTER — Other Ambulatory Visit: Payer: Self-pay

## 2020-04-13 ENCOUNTER — Encounter (INDEPENDENT_AMBULATORY_CARE_PROVIDER_SITE_OTHER): Payer: Self-pay | Admitting: Family Medicine

## 2020-04-13 VITALS — BP 130/80 | HR 76 | Temp 98.3°F | Ht 65.0 in | Wt 322.0 lb

## 2020-04-13 DIAGNOSIS — K7589 Other specified inflammatory liver diseases: Secondary | ICD-10-CM | POA: Insufficient documentation

## 2020-04-13 DIAGNOSIS — Z6841 Body Mass Index (BMI) 40.0 and over, adult: Secondary | ICD-10-CM | POA: Diagnosis not present

## 2020-04-13 DIAGNOSIS — I10 Essential (primary) hypertension: Secondary | ICD-10-CM | POA: Diagnosis not present

## 2020-04-13 DIAGNOSIS — K625 Hemorrhage of anus and rectum: Secondary | ICD-10-CM | POA: Insufficient documentation

## 2020-04-13 DIAGNOSIS — K5900 Constipation, unspecified: Secondary | ICD-10-CM | POA: Insufficient documentation

## 2020-04-13 DIAGNOSIS — N390 Urinary tract infection, site not specified: Secondary | ICD-10-CM | POA: Insufficient documentation

## 2020-04-13 DIAGNOSIS — Z1211 Encounter for screening for malignant neoplasm of colon: Secondary | ICD-10-CM | POA: Insufficient documentation

## 2020-04-13 DIAGNOSIS — K5904 Chronic idiopathic constipation: Secondary | ICD-10-CM | POA: Insufficient documentation

## 2020-04-13 DIAGNOSIS — R1084 Generalized abdominal pain: Secondary | ICD-10-CM | POA: Insufficient documentation

## 2020-04-13 DIAGNOSIS — R300 Dysuria: Secondary | ICD-10-CM | POA: Insufficient documentation

## 2020-04-13 DIAGNOSIS — Z86010 Personal history of colonic polyps: Secondary | ICD-10-CM | POA: Insufficient documentation

## 2020-04-13 DIAGNOSIS — K581 Irritable bowel syndrome with constipation: Secondary | ICD-10-CM | POA: Insufficient documentation

## 2020-04-13 DIAGNOSIS — R194 Change in bowel habit: Secondary | ICD-10-CM | POA: Insufficient documentation

## 2020-04-13 DIAGNOSIS — K219 Gastro-esophageal reflux disease without esophagitis: Secondary | ICD-10-CM | POA: Insufficient documentation

## 2020-04-13 DIAGNOSIS — Z800 Family history of malignant neoplasm of digestive organs: Secondary | ICD-10-CM | POA: Insufficient documentation

## 2020-04-13 DIAGNOSIS — K602 Anal fissure, unspecified: Secondary | ICD-10-CM | POA: Insufficient documentation

## 2020-04-13 NOTE — Telephone Encounter (Signed)
LVM for pt to call me back to schedule sleep study  

## 2020-04-14 NOTE — Progress Notes (Signed)
Chief Complaint:   OBESITY Pamela [name] is here to discuss her progress with her obesity treatment plan along with follow-up of her obesity related diagnoses. Pamela [name] is on the Category 4 Plan and states she is following her eating plan approximately 98-99% of the time. Pamela [name] states she is walking 3/4 mile for 20 minutes 7 times per week.  Today's visit was #: 3 Starting weight: 333 lbs Starting date: 03/15/2020 Today's weight: 322 lbs Today's date: 04/13/2020 Total lbs lost to date: 11 Total lbs lost since last in-office visit: 4  Interim History: Pamela [name] continues to do well with weight loss. She is struggling with PM cravings and normally eats  Dinner approximately 6 hours before bedtimes, giving her a lot of time to think about snacking. She is trying to journal but it seems her calories and protein are too low.  Subjective:   1. Essential hypertension Pamela [name]'s blood pressure is well controlled on her medications, and she is losing weight nicely. She denies signs of hypotension.  Assessment/Plan:   1. Essential hypertension Pamela [name] is working on healthy weight loss, diet, and exercise to improve blood pressure control. We will watch for signs of hypotension as she continues her lifestyle modifications.  2. Class 3 severe obesity with serious comorbidity and body mass index (BMI) of 50.0 to 59.9 in adult, unspecified obesity type (Pamela [name]) Pamela [name] is currently in the action stage of change. As such, her goal is to continue with weight loss efforts. She has agreed to change to keeping a food journal and adhering to recommended goals of 1500-1800 calories and 100+ grams of protein daily.   Exercise goals: As is.  Behavioral modification strategies: increasing lean protein intake, keeping healthy foods in the home and keeping a strict food journal.  Pamela [name] has agreed to follow-up with our clinic in 2 weeks. She was informed of the importance of frequent follow-up visits to maximize her success with  intensive lifestyle modifications for her multiple health conditions.   Objective:   Blood pressure 130/80, pulse 76, temperature 98.3 F (36.8 C), temperature source Oral, height 5\' 5"  (1.651 m), weight (!) 322 lb (146.1 kg), last menstrual period 04/07/2020, SpO2 97 %. Body mass index is 53.58 kg/m.  General: Cooperative, alert, well developed, in no acute distress. HEENT: Conjunctivae and lids unremarkable. Cardiovascular: Regular rhythm.  Lungs: Normal work of breathing. Neurologic: No focal deficits.   Lab Results  Component Value Date   CREATININE 0.96 03/15/2020   BUN 10 03/15/2020   NA 139 03/15/2020   K 3.6 03/15/2020   CL 100 03/15/2020   CO2 24 03/15/2020   Lab Results  Component Value Date   ALT 11 03/15/2020   AST 11 03/15/2020   ALKPHOS 80 03/15/2020   BILITOT 0.2 03/15/2020   Lab Results  Component Value Date   HGBA1C 6.4 (H) 03/15/2020   Lab Results  Component Value Date   INSULIN 30.0 (H) 03/15/2020   Lab Results  Component Value Date   TSH 2.170 03/15/2020   Lab Results  Component Value Date   CHOL 213 (H) 03/15/2020   HDL 66 03/15/2020   LDLCALC 128 (H) 03/15/2020   TRIG 106 03/15/2020   CHOLHDL 3.2 03/15/2020   Lab Results  Component Value Date   WBC 5.8 03/15/2020   HGB 12.2 03/15/2020   HCT 37.5 03/15/2020   MCV 85 03/15/2020   PLT 313 03/15/2020   Lab Results  Component Value Date   IRON 33 03/15/2020  TIBC 374 03/15/2020   FERRITIN 16 03/15/2020   Attestation Statements:   Reviewed by clinician on day of visit: allergies, medications, problem list, medical history, surgical history, family history, social history, and previous encounter notes.  Time spent on visit including pre-visit chart review and post-visit care and charting was 30 minutes.     I, Trixie Dredge, am acting as transcriptionist for Dennard Nip, MD.  I have reviewed the above documentation for accuracy and completeness, and I agree with the above. -   Dennard Nip, MD

## 2020-04-19 ENCOUNTER — Telehealth: Payer: Self-pay

## 2020-04-19 NOTE — Telephone Encounter (Signed)
Unable to lvm; voicemail full

## 2020-04-26 ENCOUNTER — Ambulatory Visit (INDEPENDENT_AMBULATORY_CARE_PROVIDER_SITE_OTHER): Payer: BC Managed Care – PPO | Admitting: Family Medicine

## 2020-05-03 ENCOUNTER — Ambulatory Visit (INDEPENDENT_AMBULATORY_CARE_PROVIDER_SITE_OTHER): Payer: BC Managed Care – PPO | Admitting: Neurology

## 2020-05-03 ENCOUNTER — Other Ambulatory Visit: Payer: Self-pay

## 2020-05-03 DIAGNOSIS — R351 Nocturia: Secondary | ICD-10-CM

## 2020-05-03 DIAGNOSIS — G478 Other sleep disorders: Secondary | ICD-10-CM

## 2020-05-03 DIAGNOSIS — G479 Sleep disorder, unspecified: Secondary | ICD-10-CM

## 2020-05-03 DIAGNOSIS — Z9189 Other specified personal risk factors, not elsewhere classified: Secondary | ICD-10-CM

## 2020-05-03 DIAGNOSIS — G4719 Other hypersomnia: Secondary | ICD-10-CM

## 2020-05-03 DIAGNOSIS — G4720 Circadian rhythm sleep disorder, unspecified type: Secondary | ICD-10-CM

## 2020-05-03 DIAGNOSIS — G4733 Obstructive sleep apnea (adult) (pediatric): Secondary | ICD-10-CM

## 2020-05-03 DIAGNOSIS — R0683 Snoring: Secondary | ICD-10-CM

## 2020-05-03 DIAGNOSIS — R0681 Apnea, not elsewhere classified: Secondary | ICD-10-CM

## 2020-05-04 ENCOUNTER — Encounter (INDEPENDENT_AMBULATORY_CARE_PROVIDER_SITE_OTHER): Payer: Self-pay | Admitting: Family Medicine

## 2020-05-04 ENCOUNTER — Ambulatory Visit (INDEPENDENT_AMBULATORY_CARE_PROVIDER_SITE_OTHER): Payer: BC Managed Care – PPO | Admitting: Family Medicine

## 2020-05-04 VITALS — BP 138/83 | HR 86 | Temp 98.3°F | Ht 65.0 in | Wt 313.0 lb

## 2020-05-04 DIAGNOSIS — R4589 Other symptoms and signs involving emotional state: Secondary | ICD-10-CM

## 2020-05-04 DIAGNOSIS — Z6841 Body Mass Index (BMI) 40.0 and over, adult: Secondary | ICD-10-CM | POA: Diagnosis not present

## 2020-05-13 NOTE — Addendum Note (Signed)
Addended by: Star Age on: 05/13/2020 02:29 PM   Modules accepted: Orders

## 2020-05-13 NOTE — Progress Notes (Signed)
Patient referred by Dr. Juleen China, seen by me on 04/12/20, diagnostic PSG on 05/03/20.   Please call and notify the patient that the recent sleep study showed moderate to severe (during REM sleep) obstructive sleep apnea, lowest O2 desaturation was 71%. I recommend treatment in the form of CPAP. This will require a repeat sleep study for proper titration and mask fitting and correct monitoring of the oxygen saturations. Please explain to patient. I have placed an order in the chart. Thanks.  Star Age, MD, PhD [facility])

## 2020-05-13 NOTE — Procedures (Signed)
PATIENT'S NAME:  Pamela [name], Pamela [name] DOB:      12-Sep-1970      MR#:    [id number]     DATE OF RECORDING: 05/03/2020 REFERRING M.D.:  Dr. Briscoe Deutscher Study Performed:   Baseline Polysomnogram HISTORY: 50 year old woman with a history of hypertension, prediabetes, ADHD, anxiety, asthma, back pain, irritable bowel syndrome, and morbid obesity with a BMI of over 50, who reports snoring and excessive daytime somnolence. The patient endorsed the Epworth Sleepiness Scale at 6 points. The patient's weight 327 pounds with a height of 65 (inches), resulting in a BMI of 54.4 kg/m2. The patient's neck circumference measured 17.5 inches.  CURRENT MEDICATIONS: Zyrtec, Drisdol, Hyzaar, Singulair, Protonix   PROCEDURE:  This is a multichannel digital polysomnogram utilizing the Somnostar 11.2 system.  Electrodes and sensors were applied and monitored per AASM Specifications.   EEG, EOG, Chin and Limb EMG, were sampled at 200 Hz.  ECG, Snore and Nasal Pressure, Thermal Airflow, Respiratory Effort, CPAP Flow and Pressure, Oximetry was sampled at 50 Hz. Digital video and audio were recorded.      BASELINE STUDY  Lights Out was at 22:21 and Lights On at 05:02.  Total recording time (TRT) was 401.5 minutes, with a total sleep time (TST) of 255.5 minutes.   The patient's sleep latency to persistent sleep was 58 minutes.  REM latency was 112 minutes.  The sleep efficiency was 63.6 %.     SLEEP ARCHITECTURE: WASO (Wake after sleep onset) was 132.5 minutes with one longer period of wakefulness and otherwise moderate sleep fragmentation noted. There were 40.5 minutes in Stage N1, 126.5 minutes Stage N2, 48.5 minutes Stage N3 and 40 minutes in Stage REM.  The percentage of Stage N1 was 15.9%, which is markedly increased, Stage N2 was 49.5%, which is normal, Stage N3 was 19.%, which is normal, and Stage R (REM sleep) was 15.7%, which is mildly reduced. The arousals were noted as: 49 were spontaneous, 0 were associated with PLMs,  38 were associated with respiratory events.  RESPIRATORY ANALYSIS:  There were a total of 73 respiratory events:  30 obstructive apneas, 0 central apneas and 0 mixed apneas with a total of 30 apneas and an apnea index (AI) of 7. /hour. There were 43 hypopneas with a hypopnea index of 10.1 /hour. The patient also had 0 respiratory event related arousals (RERAs).      The total APNEA/HYPOPNEA INDEX (AHI) was 17.1/hour and the total RESPIRATORY DISTURBANCE INDEX was  17.1 /hour.  37 events occurred in REM sleep and 68 events in NREM. The REM AHI was  55.5 /hour, versus a non-REM AHI of 10.. The patient spent 5.5 minutes of total sleep time in the supine position and 250 minutes in non-supine.. The supine AHI was 0.0 versus a non-supine AHI of 17.5.  OXYGEN SATURATION & C02:  The Wake baseline 02 saturation was 98%, with the lowest being 71%. Time spent below 89% saturation equaled 28 minutes.  PERIODIC LIMB MOVEMENTS: The patient had a total of 0 Periodic Limb Movements.  The Periodic Limb Movement (PLM) index was 0 and the PLM Arousal index was 0/hour.  Audio and video analysis did not show any abnormal or unusual movements, behaviors, phonations or vocalizations. The patient took no bathroom breaks. Moderate snoring was noted. The EKG was in keeping with normal sinus rhythm (NSR).  Post-study, the patient indicated that sleep was the same as usual.   IMPRESSION:  1. Obstructive Sleep Apnea (OSA) 2. Dysfunctions associated with sleep  stages or arousal from sleep  RECOMMENDATIONS:  1. This study demonstrates moderate to severe obstructive sleep apnea, with a total AHI of 17.1/hour, REM AHI of 55.5/hour, and O2 nadir of 71%. Treatment with positive airway pressure in the form of CPAP is recommended. This will require a full night titration study to optimize therapy. Other treatment options may be limited due to the severity of her sleep apnea, but - generally speaking - may include avoidance of  supine sleep position along with weight loss, upper airway or jaw surgery in selected patients or the use of an oral appliance in certain patients. ENT evaluation and/or consultation with a maxillofacial surgeon or dentist may be feasible in some instances.    2. Please note that untreated obstructive sleep apnea may carry additional perioperative morbidity. Patients with significant obstructive sleep apnea should receive perioperative PAP therapy and the surgeons and particularly the anesthesiologist should be informed of the diagnosis and the severity of the sleep disordered breathing. 3. This study shows sleep fragmentation and abnormal sleep stage percentages; these are nonspecific findings and per se do not signify an intrinsic sleep disorder or a cause for the patient's sleep-related symptoms. Causes include (but are not limited to) the first night effect of the sleep study, circadian rhythm disturbances, medication effect or an underlying mood disorder or medical problem.  4. The patient should be cautioned not to drive, work at heights, or operate dangerous or heavy equipment when tired or sleepy. Review and reiteration of good sleep hygiene measures should be pursued with any patient. 5. The patient will be seen in follow-up in the sleep clinic at [facility] for discussion of the test results, symptom and treatment compliance review, further management strategies, etc. The referring provider will be notified of the test results.  I certify that I have reviewed the entire raw data recording prior to the issuance of this report in accordance with the Standards of Accreditation of the American Academy of Sleep Medicine (AASM)   Star Age, MD, PhD Diplomat, American Board of Neurology and Sleep Medicine (Neurology and Sleep Medicine)

## 2020-05-14 NOTE — Progress Notes (Signed)
Chief Complaint:   OBESITY Pamela [name] is here to discuss her progress with her obesity treatment plan along with follow-up of her obesity related diagnoses.   Today's visit was #: 4 Starting weight: 333 lbs Starting date: 03/15/2020 Today's weight: 313 lbs Today's date: 05/04/2020 Total lbs lost to date: 20 lbs Body mass index is 52.09 kg/m.  Total weight loss percentage to date: -6.01%  Interim History:  Pamela [name] says she was really struggling over the last couple of weeks.  She ate poorly but did not eat a lot (cheesecake for dinner, etc.).  She is discouraged with herself today and doubting whether or not she can do this.  She has been having emotional eating episodes with increased stressors in her life.  Not journaling or calculating amounts at all.    Plan:  Simplify things.  She will follow Category 4 as she feels it will be easier for her.  Referral to Dr. Mallie Mussel for emotional eating counseling.  Handouts given and counseling done today as well.  Current Meal Plan: keeping a food journal and adhering to recommended goals of 1500-1800 calories and 100 grams of protein for 30% of the time.  Current Exercise Plan: Walking for 30 minutes 3-4 times per week..  Assessment/Plan:   1. Depressed mood, with emotional eating Not at goal. Medication: None.  Denies feeling depressed or needing medication.  She says that she just has more stressors in her life that have caused more emotional eating and very poor food choices lately.  She wants to get help with this.  Plan:  Behavior modification techniques were discussed today to help deal with emotional/non-hunger eating behaviors. Patient was referred to Dr. Mallie Mussel, our Bariatric Psychologist, for evaluation due to her elevated PHQ-9 score and significant struggles with emotional eating.  Handouts given.  2. Obesity, current BMI 52.2  Course: Pamela [name] is currently in the action stage of change. As such, her goal is to continue with weight loss  efforts.   Nutrition goals: She has agreed to the Category 4 Plan.   Exercise goals: As is.  Behavioral modification strategies: increasing lean protein intake, decreasing simple carbohydrates, meal planning and cooking strategies, emotional eating strategies, avoiding temptations, planning for success and decreasing junk food.  Pamela [name] has agreed to follow-up with our clinic in 2 weeks. She was informed of the importance of frequent follow-up visits to maximize her success with intensive lifestyle modifications for her multiple health conditions.   Objective:   Blood pressure 138/83, pulse 86, temperature 98.3 F (36.8 C), height 5\' 5"  (1.651 m), weight (!) 313 lb (142 kg), last menstrual period 04/07/2020, SpO2 98 %. Body mass index is 52.09 kg/m.  General: Cooperative, alert, well developed, in no acute distress. HEENT: Conjunctivae and lids unremarkable. Cardiovascular: Regular rhythm.  Lungs: Normal work of breathing. Neurologic: No focal deficits.   Lab Results  Component Value Date   CREATININE 0.96 03/15/2020   BUN 10 03/15/2020   NA 139 03/15/2020   K 3.6 03/15/2020   CL 100 03/15/2020   CO2 24 03/15/2020   Lab Results  Component Value Date   ALT 11 03/15/2020   AST 11 03/15/2020   ALKPHOS 80 03/15/2020   BILITOT 0.2 03/15/2020   Lab Results  Component Value Date   HGBA1C 6.4 (H) 03/15/2020   Lab Results  Component Value Date   INSULIN 30.0 (H) 03/15/2020   Lab Results  Component Value Date   TSH 2.170 03/15/2020   Lab Results  Component Value Date   CHOL 213 (H) 03/15/2020   HDL 66 03/15/2020   LDLCALC 128 (H) 03/15/2020   TRIG 106 03/15/2020   CHOLHDL 3.2 03/15/2020   Lab Results  Component Value Date   WBC 5.8 03/15/2020   HGB 12.2 03/15/2020   HCT 37.5 03/15/2020   MCV 85 03/15/2020   PLT 313 03/15/2020   Lab Results  Component Value Date   IRON 33 03/15/2020   TIBC 374 03/15/2020   FERRITIN 16 03/15/2020   Attestation Statements:    Reviewed by clinician on day of visit: allergies, medications, problem list, medical history, surgical history, family history, social history, and previous encounter notes.  Time spent on visit including pre-visit chart review and post-visit care and charting was 30 minutes.   I, Water quality scientist, CMA, am acting as Location manager for [employer], DO.  I have reviewed the above documentation for accuracy and completeness, and I agree with the above. Marjory Sneddon, D.O.  The [place] was signed into law in 2016 which includes the topic of electronic health records.  This provides immediate access to information in MyChart.  This includes consultation notes, operative notes, office notes, lab results and pathology reports.  If you have any questions about what you read please let us know at your next visit so we can discuss your concerns and take corrective action if need be.  We are right here with you.

## 2020-05-16 ENCOUNTER — Telehealth: Payer: Self-pay

## 2020-05-16 NOTE — Telephone Encounter (Signed)
-----   Message from Star Age, MD sent at 05/13/2020  2:29 PM EDT ----- Patient referred by Dr. Juleen China, seen by me on 04/12/20, diagnostic PSG on 05/03/20.   Please call and notify the patient that the recent sleep study showed moderate to severe (during REM sleep) obstructive sleep apnea, lowest O2 desaturation was 71%. I recommend treatment in the form of CPAP. This will require a repeat sleep study for proper titration and mask fitting and correct monitoring of the oxygen saturations. Please explain to patient. I have placed an order in the chart. Thanks.  Star Age, MD, PhD [facility])

## 2020-05-16 NOTE — Telephone Encounter (Signed)
I called pt. I advised pt that Dr. Rexene Alberts reviewed their sleep study results and found that Moderate/sever osa was noted and recommends that pt be treated with a cpap. Dr. Rexene Alberts recommends that pt return for a repeat sleep study in order to properly titrate the cpap and ensure a good mask fit. Pt is agreeable to returning for a titration study. I advised pt that our sleep lab will file with pt's insurance and call pt to schedule the sleep study when we hear back from the pt's insurance regarding coverage of this sleep study. Pt verbalized understanding of results. Pt had no questions at this time but was encouraged to call back if questions arise.

## 2020-05-19 ENCOUNTER — Ambulatory Visit (INDEPENDENT_AMBULATORY_CARE_PROVIDER_SITE_OTHER): Payer: BC Managed Care – PPO | Admitting: Family Medicine

## 2020-05-19 ENCOUNTER — Telehealth: Payer: Self-pay

## 2020-05-19 NOTE — Telephone Encounter (Signed)
LVM for pt to call me back to schedule sleep study  

## 2020-05-24 ENCOUNTER — Other Ambulatory Visit: Payer: Self-pay

## 2020-05-24 ENCOUNTER — Encounter (INDEPENDENT_AMBULATORY_CARE_PROVIDER_SITE_OTHER): Payer: Self-pay | Admitting: Physician Assistant

## 2020-05-24 ENCOUNTER — Ambulatory Visit (INDEPENDENT_AMBULATORY_CARE_PROVIDER_SITE_OTHER): Payer: BC Managed Care – PPO | Admitting: Physician Assistant

## 2020-05-24 VITALS — BP 120/76 | HR 77 | Temp 98.5°F | Ht 65.0 in | Wt 314.0 lb

## 2020-05-24 DIAGNOSIS — Z9189 Other specified personal risk factors, not elsewhere classified: Secondary | ICD-10-CM

## 2020-05-24 DIAGNOSIS — E559 Vitamin D deficiency, unspecified: Secondary | ICD-10-CM

## 2020-05-24 DIAGNOSIS — R7303 Prediabetes: Secondary | ICD-10-CM

## 2020-05-24 MED ORDER — VITAMIN D (ERGOCALCIFEROL) 1.25 MG (50000 UNIT) PO CAPS
50000.0000 [IU] | ORAL_CAPSULE | ORAL | 0 refills | Status: DC
Start: 2020-05-24 — End: 2020-06-14

## 2020-05-25 NOTE — Progress Notes (Signed)
Chief Complaint:   OBESITY Pamela [name] is here to discuss her progress with her obesity treatment plan along with follow-up of her obesity related diagnoses. Pamela [name] is on the Category 4 Plan and states she is following her eating plan approximately 95% of the time. Pamela [name] states she is doing step aerobics 20 minutes 1-2 times per week.  Today's visit was #: 5 Starting weight: 333 lbs Starting date: 03/15/2020 Today's weight: 314 lbs Today's date: 05/24/2020 Total lbs lost to date: 19 Total lbs lost since last in-office visit: 0  Interim History: Pamela [name] believes that she would like to go back to journaling. She feels that it would help her be more accountable to see exactly what she is eating.  Subjective:   1. Vitamin D deficiency Pamela [name] is on prescription Vit D and tolerating it well.  2. Pre-diabetes Pamela [name]'s last A1c was 6.4. She is not on medication. She is exercising 1-2 times a week.   3. At risk for diabetes mellitus Pamela [name] is at higher than average risk for developing diabetes due to obesity.   Assessment/Plan:   1. Vitamin D deficiency Low Vitamin D level contributes to fatigue and are associated with obesity, breast, and colon cancer. She agrees to continue to take prescription Vitamin D @50 ,000 IU every week and will follow-up for routine testing of Vitamin D, at least 2-3 times per year to avoid over-replacement.  - Vitamin D, Ergocalciferol, (DRISDOL) 1.25 MG (50000 UNIT) CAPS capsule; Take 1 capsule (50,000 Units total) by mouth every 7 (seven) days.  Dispense: 12 capsule; Refill: 0  2. Pre-diabetes Pamela [name] will continue to work on weight loss, exercise, and decreasing simple carbohydrates to help decrease the risk of diabetes. Increase exercise and continue meal plan.  3. At risk for diabetes mellitus Pamela [name] was given approximately 15 minutes of diabetes education and counseling today. We discussed intensive lifestyle modifications today with an emphasis on weight loss as  well as increasing exercise and decreasing simple carbohydrates in her diet. We also reviewed medication options with an emphasis on risk versus benefit of those discussed.   Repetitive spaced learning was employed today to elicit superior memory formation and behavioral change.  4. Morbid obesity with current BMI 52.3 Pamela [name] is currently in the action stage of change. As such, her goal is to continue with weight loss efforts. She has agreed to keeping a food journal and adhering to recommended goals of 1500-1800 calories and 115 grams protein.   Exercise goals: As is  Behavioral modification strategies: meal planning and cooking strategies and keeping healthy foods in the home.  Pamela [name] has agreed to follow-up with our clinic in 2 weeks. She was informed of the importance of frequent follow-up visits to maximize her success with intensive lifestyle modifications for her multiple health conditions.   Objective:   Blood pressure 120/76, pulse 77, temperature 98.5 F (36.9 C), height 5\' 5"  (1.651 m), weight (!) 314 lb (142.4 kg), SpO2 99 %. Body mass index is 52.25 kg/m.  General: Cooperative, alert, well developed, in no acute distress. HEENT: Conjunctivae and lids unremarkable. Cardiovascular: Regular rhythm.  Lungs: Normal work of breathing. Neurologic: No focal deficits.   Lab Results  Component Value Date   CREATININE 0.96 03/15/2020   BUN 10 03/15/2020   NA 139 03/15/2020   K 3.6 03/15/2020   CL 100 03/15/2020   CO2 24 03/15/2020   Lab Results  Component Value Date   ALT 11 03/15/2020   AST 11 03/15/2020  ALKPHOS 80 03/15/2020   BILITOT 0.2 03/15/2020   Lab Results  Component Value Date   HGBA1C 6.4 (H) 03/15/2020   Lab Results  Component Value Date   INSULIN 30.0 (H) 03/15/2020   Lab Results  Component Value Date   TSH 2.170 03/15/2020   Lab Results  Component Value Date   CHOL 213 (H) 03/15/2020   HDL 66 03/15/2020   LDLCALC 128 (H) 03/15/2020   TRIG 106  03/15/2020   CHOLHDL 3.2 03/15/2020   Lab Results  Component Value Date   WBC 5.8 03/15/2020   HGB 12.2 03/15/2020   HCT 37.5 03/15/2020   MCV 85 03/15/2020   PLT 313 03/15/2020   Lab Results  Component Value Date   IRON 33 03/15/2020   TIBC 374 03/15/2020   FERRITIN 16 03/15/2020     Attestation Statements:   Reviewed by clinician on day of visit: allergies, medications, problem list, medical history, surgical history, family history, social history, and previous encounter notes.  Coral Ceo, am acting as Location manager for [employer], PA-C.  I have reviewed the above documentation for accuracy and completeness, and I agree with the above. Abby Potash, PA-C

## 2020-05-27 ENCOUNTER — Encounter (INDEPENDENT_AMBULATORY_CARE_PROVIDER_SITE_OTHER): Payer: Self-pay | Admitting: Family Medicine

## 2020-06-08 ENCOUNTER — Other Ambulatory Visit: Payer: Self-pay

## 2020-06-08 ENCOUNTER — Ambulatory Visit (INDEPENDENT_AMBULATORY_CARE_PROVIDER_SITE_OTHER): Payer: BC Managed Care – PPO | Admitting: Neurology

## 2020-06-08 DIAGNOSIS — G4733 Obstructive sleep apnea (adult) (pediatric): Secondary | ICD-10-CM

## 2020-06-08 DIAGNOSIS — G4719 Other hypersomnia: Secondary | ICD-10-CM

## 2020-06-08 DIAGNOSIS — G479 Sleep disorder, unspecified: Secondary | ICD-10-CM

## 2020-06-08 DIAGNOSIS — G478 Other sleep disorders: Secondary | ICD-10-CM

## 2020-06-08 DIAGNOSIS — G4720 Circadian rhythm sleep disorder, unspecified type: Secondary | ICD-10-CM

## 2020-06-08 DIAGNOSIS — Z6841 Body Mass Index (BMI) 40.0 and over, adult: Secondary | ICD-10-CM

## 2020-06-08 DIAGNOSIS — R351 Nocturia: Secondary | ICD-10-CM

## 2020-06-14 ENCOUNTER — Encounter (INDEPENDENT_AMBULATORY_CARE_PROVIDER_SITE_OTHER): Payer: Self-pay | Admitting: Physician Assistant

## 2020-06-14 ENCOUNTER — Other Ambulatory Visit: Payer: Self-pay

## 2020-06-14 ENCOUNTER — Ambulatory Visit (INDEPENDENT_AMBULATORY_CARE_PROVIDER_SITE_OTHER): Payer: BC Managed Care – PPO | Admitting: Physician Assistant

## 2020-06-14 VITALS — BP 124/75 | HR 60 | Temp 99.1°F | Ht 65.0 in | Wt 308.0 lb

## 2020-06-14 DIAGNOSIS — Z9189 Other specified personal risk factors, not elsewhere classified: Secondary | ICD-10-CM

## 2020-06-14 DIAGNOSIS — E559 Vitamin D deficiency, unspecified: Secondary | ICD-10-CM

## 2020-06-14 DIAGNOSIS — I10 Essential (primary) hypertension: Secondary | ICD-10-CM | POA: Diagnosis not present

## 2020-06-14 MED ORDER — VITAMIN D (ERGOCALCIFEROL) 1.25 MG (50000 UNIT) PO CAPS: 50000.0000 [IU] | ORAL_CAPSULE | ORAL | 0 refills | Status: DC

## 2020-06-14 NOTE — Progress Notes (Signed)
Chief Complaint:   OBESITY Pamela [name] is here to discuss her progress with her obesity treatment plan along with follow-up of her obesity related diagnoses. Pamela [name] is on keeping a food journal and adhering to recommended goals of 1500-1800 calories and 115 grams of protein daily and states she is following her eating plan approximately 100% of the time. Pamela [name] states she is doing 0 minutes 0 times per week.  Today's visit was #: 6 Starting weight: 333 lbs Starting date: 03/15/2020 Today's weight: 308 lbs Today's date: 06/14/2020 Total lbs lost to date: 25 Total lbs lost since last in-office visit: 6  Interim History: Pamela [name] did very well with weight loss. She gets to the end of the night and sometimes has to eat more calories. She is averaging 115 grams of protein daily, and between 1400-1600 calories daily. She wants to start strength training.  Subjective:   1. Vitamin D deficiency Pamela [name] is on Vit D weekly, and she denies nausea, vomiting, or muscle weakness.  2. Essential hypertension Pamela [name] is on Hyzaar, and she denies chest pain or headache. Her blood pressure is controlled.  3. At risk for heart disease Pamela [name] is at a higher than average risk for cardiovascular disease due to obesity.   Assessment/Plan:   1. Vitamin D deficiency Low Vitamin D level contributes to fatigue and are associated with obesity, breast, and colon cancer. We will refill prescription Vitamin D 50,000 IU every week for 90 days with no refills. Pamela [name] will follow-up for routine testing of Vitamin D, at least 2-3 times per year to avoid over-replacement.  - Vitamin D, Ergocalciferol, (DRISDOL) 1.25 MG (50000 UNIT) CAPS capsule; Take 1 capsule (50,000 Units total) by mouth every 7 (seven) days.  Dispense: 12 capsule; Refill: 0  2. Essential hypertension Pamela [name] will continue her medications and weight loss to improve blood pressure control. We will watch for signs of hypotension as she continues her lifestyle  modifications.  3. At risk for heart disease Pamela [name] was given approximately 15 minutes of coronary artery disease prevention counseling today. She is 50 y.o. female and has risk factors for heart disease including obesity. We discussed intensive lifestyle modifications today with an emphasis on specific weight loss instructions and strategies.   Repetitive spaced learning was employed today to elicit superior memory formation and behavioral change.  4. Morbid obesity ([place]) Pamela [name] is currently in the action stage of change. As such, her goal is to continue with weight loss efforts. She has agreed to keeping a food journal and adhering to recommended goals of 1500-1800 calories and 115 grams of protein daily.   Exercise goals: No exercise has been prescribed at this time.  Behavioral modification strategies: meal planning and cooking strategies and keeping healthy foods in the home.  Pamela [name] has agreed to follow-up with our clinic in 2 weeks. She was informed of the importance of frequent follow-up visits to maximize her success with intensive lifestyle modifications for her multiple health conditions.   Objective:   Blood pressure 124/75, pulse 60, temperature 99.1 F (37.3 C), height 5\' 5"  (1.651 m), weight (!) 308 lb (139.7 kg), SpO2 98 %. Body mass index is 51.25 kg/m.  General: Cooperative, alert, well developed, in no acute distress. HEENT: Conjunctivae and lids unremarkable. Cardiovascular: Regular rhythm.  Lungs: Normal work of breathing. Neurologic: No focal deficits.   Lab Results  Component Value Date   CREATININE 0.96 03/15/2020   BUN 10 03/15/2020   NA 139 03/15/2020   K 3.6  03/15/2020   CL 100 03/15/2020   CO2 24 03/15/2020   Lab Results  Component Value Date   ALT 11 03/15/2020   AST 11 03/15/2020   ALKPHOS 80 03/15/2020   BILITOT 0.2 03/15/2020   Lab Results  Component Value Date   HGBA1C 6.4 (H) 03/15/2020   Lab Results  Component Value Date   INSULIN  30.0 (H) 03/15/2020   Lab Results  Component Value Date   TSH 2.170 03/15/2020   Lab Results  Component Value Date   CHOL 213 (H) 03/15/2020   HDL 66 03/15/2020   LDLCALC 128 (H) 03/15/2020   TRIG 106 03/15/2020   CHOLHDL 3.2 03/15/2020   Lab Results  Component Value Date   WBC 5.8 03/15/2020   HGB 12.2 03/15/2020   HCT 37.5 03/15/2020   MCV 85 03/15/2020   PLT 313 03/15/2020   Lab Results  Component Value Date   IRON 33 03/15/2020   TIBC 374 03/15/2020   FERRITIN 16 03/15/2020   Attestation Statements:   Reviewed by clinician on day of visit: allergies, medications, problem list, medical history, surgical history, family history, social history, and previous encounter notes.   Wilhemena Durie, am acting as transcriptionist for [employer], PA-C.  I have reviewed the above documentation for accuracy and completeness, and I agree with the above. Abby Potash, PA-C

## 2020-06-21 NOTE — Procedures (Signed)
PATIENT'S NAME:  Pamela [name], [name] DOB:      1970-05-19      MR#:    [id number]     DATE OF RECORDING: 06/08/2020 REFERRING M.D.:   Study Performed:   CPAP  Titration HISTORY: 50 year old woman with a history of hypertension, prediabetes, ADHD, anxiety, asthma, back pain, irritable bowel syndrome, and morbid obesity with a BMI of over 50, who presents for a full night titration study to treat her obstructive sleep apnea.  Her baseline sleep study from 05/03/2020 showed moderate to severe obstructive sleep apnea, with a total AHI of 17.1/hour, REM AHI of 55.5/hour, and O2 nadir of 71%. The patient endorsed the Epworth Sleepiness Scale at 6 points. The patient's weight 327 pounds with a height of 65 (inches), resulting in a BMI of 54.4 kg/m2. The patient's neck circumference measured 17.5 inches.  CURRENT MEDICATIONS: Zyrtec, Drisdol, Hyzaar, Singulair, Protonix  PROCEDURE:  This is a multichannel digital polysomnogram utilizing the SomnoStar 11.2 system.  Electrodes and sensors were applied and monitored per AASM Specifications.   EEG, EOG, Chin and Limb EMG, were sampled at 200 Hz.  ECG, Snore and Nasal Pressure, Thermal Airflow, Respiratory Effort, CPAP Flow and Pressure, Oximetry was sampled at 50 Hz. Digital video and audio were recorded.      The patient was fitted with a medium ResMed N 20 nasal mask.  CPAP was initiated with humidity by AASM standards at 5 cm and titrated gradually to a final pressure of 12 cm.  On the final pressure her AHI was 0/h, with nonsupine REM sleep achieved and O2 nadir of 93%.   Lights Out was at 22:03 and Lights On at 05:54. Total recording time (TRT) was 444.5 minutes, with a total sleep time (TST) of 309.5 minutes. The patient's sleep latency to persistent sleep was 98 minutes, which is delayed. REM latency was 93 minutes.  The sleep efficiency was 69.6 %.    SLEEP ARCHITECTURE: WASO (Wake after sleep onset)  was 59.5 minutes with mild to moderate sleep fragmentation  noted.  There were 73.5 minutes in Stage N1, 180 minutes Stage N2, 6.5 minutes Stage N3 and 49.5 minutes in Stage REM.  The percentage of Stage N1 was 23.7%, with is markedly increased, Stage N2 was 58.2%, which is increased, Stage N3 was 2.1% and Stage R (REM sleep) was 16.%, which is reduced. The arousals were noted as: 19 were spontaneous, 0 were associated with PLMs, 0 were associated with respiratory events.  RESPIRATORY ANALYSIS:  There was a total of 4 respiratory events: 0 obstructive apneas, 0 central apneas and 0 mixed apneas with a total of 0 apneas and an apnea index (AI) of 0 /hour. There were 4 hypopneas with a hypopnea index of .8/hour. The patient also had 0 respiratory event related arousals (RERAs).      The total APNEA/HYPOPNEA INDEX  (AHI) was .8 /hour and the total RESPIRATORY DISTURBANCE INDEX was .8 /hour  2 events occurred in REM sleep and 2 events in NREM. The REM AHI was 2.4 /hour versus a non-REM AHI of .5 /hour.  The patient spent 0 minutes of total sleep time in the supine position and 310 minutes in non-supine. The supine AHI was 0.0, versus a non-supine AHI of 0.8.  OXYGEN SATURATION & C02:  The baseline 02 saturation was 97%, with the lowest being 90%. Time spent below 89% saturation equaled 0 minutes.  PERIODIC LIMB MOVEMENTS:  The patient had a total of 0 Periodic Limb Movements. The Periodic  Limb Movement (PLM) index was 0 and the PLM Arousal index was 0 /hour.  Audio and video analysis did not show any abnormal or unusual movements, behaviors, phonations or vocalizations. The patient took bathroom breaks. The EKG was in keeping with normal sinus rhythm (NSR).  Post-study, the patient indicated that sleep was better than usual.   IMPRESSION: 1. Obstructive Sleep Apnea (OSA) 2. Dysfunctions associated with sleep stages or arousal from sleep   RECOMMENDATIONS: 1. This study demonstrates resolution of the patient's obstructive sleep apnea with CPAP therapy. I will,  therefore, start the patient on home CPAP treatment at a pressure of 12 cm via medium nasal mask with heated humidity. The patient will be advised to be fully compliant with PAP therapy to improve sleep related symptoms and decrease long term cardiovascular risks. The patient should be reminded, that it may take up to 3 months to get fully used to using PAP with all planned sleep. The earlier full compliance is achieved, the better long term compliance tends to be. Please note that untreated obstructive sleep apnea may carry additional perioperative morbidity. Patients with significant obstructive sleep apnea should receive perioperative PAP therapy and the surgeons and particularly the anesthesiologist should be informed of the diagnosis and the severity of the sleep disordered breathing. 2. This study shows sleep fragmentation and abnormal sleep stage percentages; these are nonspecific findings and per se do not signify an intrinsic sleep disorder or a cause for the patient's sleep-related symptoms. Causes include (but are not limited to) the first night effect of the sleep study, circadian rhythm disturbances, medication effect or an underlying mood disorder or medical problem.  3. The patient should be cautioned not to drive, work at heights, or operate dangerous or heavy equipment when tired or sleepy. Review and reiteration of good sleep hygiene measures should be pursued with any patient. 4. The patient will be seen in follow-up in the sleep clinic at [facility] for discussion of the test results, symptom and treatment compliance review, further management strategies, etc. The referring provider will be notified of the test results.   I certify that I have reviewed the entire raw data recording prior to the issuance of this report in accordance with the Standards of Accreditation of the American Academy of Sleep Medicine (AASM)     Star Age, MD, PhD Diplomat, American Board of Neurology and Sleep  Medicine (Neurology and Sleep Medicine)

## 2020-06-21 NOTE — Progress Notes (Signed)
Patient referred by Dr. Juleen China, seen by me on 04/12/20, diagnostic PSG on 05/03/20.  Patient had a CPAP titration study on 06/08/20.  Please call and inform patient that I have entered an order for treatment with positive airway pressure (PAP) treatment for obstructive sleep apnea (OSA). She did well during the latest sleep study with CPAP. We will, therefore, arrange for a machine for home use through a DME (durable medical equipment) company of Her choice; and I will see the patient back in follow-up in about 10 weeks. Please also explain to the patient that I will be looking out for compliance data, which can be downloaded from the machine (stored on an SD card, that is inserted in the machine) or via remote access through a modem, that is built into the machine. At the time of the followup appointment we will discuss sleep study results and how it is going with PAP treatment at home. Please advise patient to bring Her machine at the time of the first FU visit, even though this is cumbersome. Bringing the machine for every visit after that will likely not be needed, but often helps for the first visit to troubleshoot if needed. Please re-enforce the importance of compliance with treatment and the need for [place] to monitor compliance data - often an insurance requirement and actually good feedback for the patient as far as how they are doing.  Also remind patient, that any interim PAP machine or mask issues should be first addressed with the DME company, as they can often help better with technical and mask fit issues. Please ask if patient has a preference regarding DME company.  Please also make sure, the patient has a follow-up appointment with me in about 10 weeks from the setup date, thanks. May see one of our nurse practitioners if needed for proper timing of the FU appointment.  Please fax or rout report to the referring provider. Thanks,   Star Age, MD, PhD [facility] [place])

## 2020-06-21 NOTE — Addendum Note (Signed)
Addended by: Star Age on: 06/21/2020 04:00 PM   Modules accepted: Orders

## 2020-06-23 ENCOUNTER — Telehealth: Payer: Self-pay

## 2020-06-23 NOTE — Telephone Encounter (Signed)
-----   Message from Star Age, MD sent at 06/21/2020  4:00 PM EDT ----- Patient referred by Dr. Juleen China, seen by me on 04/12/20, diagnostic PSG on 05/03/20.  Patient had a CPAP titration study on 06/08/20.  Please call and inform patient that I have entered an order for treatment with positive airway pressure (PAP) treatment for obstructive sleep apnea (OSA). She did well during the latest sleep study with CPAP. We will, therefore, arrange for a machine for home use through a DME (durable medical equipment) company of Her choice; and I will see the patient back in follow-up in about 10 weeks. Please also explain to the patient that I will be looking out for compliance data, which can be downloaded from the machine (stored on an SD card, that is inserted in the machine) or via remote access through a modem, that is built into the machine. At the time of the followup appointment we will discuss sleep study results and how it is going with PAP treatment at home. Please advise patient to bring Her machine at the time of the first FU visit, even though this is cumbersome. Bringing the machine for every visit after that will likely not be needed, but often helps for the first visit to troubleshoot if needed. Please re-enforce the importance of compliance with treatment and the need for [place] to monitor compliance data - often an insurance requirement and actually good feedback for the patient as far as how they are doing.  Also remind patient, that any interim PAP machine or mask issues should be first addressed with the DME company, as they can often help better with technical and mask fit issues. Please ask if patient has a preference regarding DME company.  Please also make sure, the patient has a follow-up appointment with me in about 10 weeks from the setup date, thanks. May see one of our nurse practitioners if needed for proper timing of the FU appointment.  Please fax or rout report to the referring provider.  Thanks,   Star Age, MD, PhD [facility] [place])

## 2020-06-23 NOTE — Telephone Encounter (Signed)
I called pt. No answer, left a message asking pt to call me back.   

## 2020-06-27 NOTE — Telephone Encounter (Signed)
Pt called me back and we discussed results of sleep study. I advised pt that Dr. Rexene Alberts reviewed their sleep study results and found that pt did well during titration study on CPAP. Dr. Rexene Alberts recommends that pt start CPAP at home from treatment. I reviewed PAP compliance expectations with the pt. Pt is agreeable to starting a CPAP. I advised pt that an order will be sent to a DME, [facility], and [facility] will call the pt within about one week after they file with the pt's insurance. [facility] will show the pt how to use the machine, fit for masks, and troubleshoot the CPAP if needed. A follow up appt was made for insurance purposes with Dr. Rexene Alberts on 09/05/20 at 200 pm. Pt verbalized understanding to arrive 15 minutes early and bring their CPAP. A letter with all of this information in it will be mailed to the pt as a reminder. I verified with the pt that the address we have on file is correct. Pt verbalized understanding of results. Pt had no questions at this time but was encouraged to call back if questions arise. I have sent the order to [facility] and have received confirmation that they have received the order.

## 2020-06-30 ENCOUNTER — Ambulatory Visit
Admission: RE | Admit: 2020-06-30 | Discharge: 2020-06-30 | Disposition: A | Discharge status: Home / Self Care | Payer: BC Managed Care – PPO | Source: Ambulatory Visit | Attending: Internal Medicine | Admitting: Internal Medicine

## 2020-06-30 ENCOUNTER — Ambulatory Visit (INDEPENDENT_AMBULATORY_CARE_PROVIDER_SITE_OTHER): Payer: BC Managed Care – PPO | Admitting: Physician Assistant

## 2020-06-30 ENCOUNTER — Other Ambulatory Visit: Payer: Self-pay

## 2020-06-30 DIAGNOSIS — Z1231 Encounter for screening mammogram for malignant neoplasm of breast: Secondary | ICD-10-CM

## 2020-07-03 ENCOUNTER — Encounter (INDEPENDENT_AMBULATORY_CARE_PROVIDER_SITE_OTHER): Payer: Self-pay | Admitting: Family Medicine

## 2020-07-07 ENCOUNTER — Ambulatory Visit (INDEPENDENT_AMBULATORY_CARE_PROVIDER_SITE_OTHER): Payer: BC Managed Care – PPO | Admitting: Family Medicine

## 2020-07-07 ENCOUNTER — Other Ambulatory Visit: Payer: Self-pay

## 2020-07-07 ENCOUNTER — Encounter (INDEPENDENT_AMBULATORY_CARE_PROVIDER_SITE_OTHER): Payer: Self-pay | Admitting: Family Medicine

## 2020-07-07 VITALS — BP 132/101 | HR 64 | Temp 98.1°F | Ht 65.0 in | Wt 300.0 lb

## 2020-07-07 DIAGNOSIS — R7303 Prediabetes: Secondary | ICD-10-CM | POA: Diagnosis not present

## 2020-07-07 DIAGNOSIS — Z6841 Body Mass Index (BMI) 40.0 and over, adult: Secondary | ICD-10-CM | POA: Diagnosis not present

## 2020-07-07 DIAGNOSIS — N943 Premenstrual tension syndrome: Secondary | ICD-10-CM | POA: Diagnosis not present

## 2020-07-13 NOTE — Progress Notes (Signed)
Chief Complaint:   OBESITY Pamela [name] is here to discuss her progress with her obesity treatment plan along with follow-up of her obesity related diagnoses. Pamela [name] is on keeping a food journal and adhering to recommended goals of 1500-1800 calories and 115 grams of protein and states she is following her eating plan approximately 88% of the time. Pamela [name] states she is walking and doing a strength circuit for 1 hour 7 times per week.  Today's visit was #: 7 Starting weight: 333 lbs Starting date: 03/15/2020 Today's weight: 300 lbs Today's date: 07/07/2020 Total lbs lost to date: 8 lbs Total lbs lost since last in-office visit: 33 lbs  Interim History: Pamela [name] has started doing resistance training and walking for exercise.  She is weighing daily, which is frustrating for her. Due to fluctuation.  She is wondering whether to add more calories since she is exercising.  Subjective:   1. PMS (premenstrual syndrome) She is struggling with craving sweets the week before her period.  She goes as far as to have cheesecake for dinner.  2. Prediabetes Last A1c was 6.4.  She is not on metformin.  She has polyphagia only the week before her period.  Lab Results  Component Value Date   HGBA1C 6.4 (H) 03/15/2020   Lab Results  Component Value Date   INSULIN 30.0 (H) 03/15/2020   Assessment/Plan:   1. PMS (premenstrual syndrome) She will plan for a sweet on the days she has cravings.  Consider bupropion if needed.  2. Prediabetes Discussed metformin.  She will consider either that or GLP-1.  3. Obesity, current BMI 49.92  Pamela [name] is currently in the action stage of change. As such, her goal is to continue with weight loss efforts. She has agreed to keeping a food journal and adhering to recommended goals of 1500 calories and 115 grams of protein.   Weigh only once weekly.  Try Clio bar as substitute for cheesecake.  Do not add more food for exercise.  Exercise goals:  As is.  Behavioral  modification strategies: better snacking choices.  Pamela [name] has agreed to follow-up with our clinic in 3-4 weeks, fasting. conditions.   Objective:   Blood pressure (!) 132/101, pulse 64, temperature 98.1 F (36.7 C), height 5\' 5"  (1.651 m), weight 300 lb (136.1 kg), last menstrual period 06/25/2020, SpO2 97 %. Body mass index is 49.92 kg/m.  General: Cooperative, alert, well developed, in no acute distress. HEENT: Conjunctivae and lids unremarkable. Cardiovascular: Regular rhythm.  Lungs: Normal work of breathing. Neurologic: No focal deficits.   Lab Results  Component Value Date   CREATININE 0.96 03/15/2020   BUN 10 03/15/2020   NA 139 03/15/2020   K 3.6 03/15/2020   CL 100 03/15/2020   CO2 24 03/15/2020   Lab Results  Component Value Date   ALT 11 03/15/2020   AST 11 03/15/2020   ALKPHOS 80 03/15/2020   BILITOT 0.2 03/15/2020   Lab Results  Component Value Date   HGBA1C 6.4 (H) 03/15/2020   Lab Results  Component Value Date   INSULIN 30.0 (H) 03/15/2020   Lab Results  Component Value Date   TSH 2.170 03/15/2020   Lab Results  Component Value Date   CHOL 213 (H) 03/15/2020   HDL 66 03/15/2020   LDLCALC 128 (H) 03/15/2020   TRIG 106 03/15/2020   CHOLHDL 3.2 03/15/2020   Lab Results  Component Value Date   WBC 5.8 03/15/2020   HGB 12.2 03/15/2020   HCT  37.5 03/15/2020   MCV 85 03/15/2020   PLT 313 03/15/2020   Lab Results  Component Value Date   IRON 33 03/15/2020   TIBC 374 03/15/2020   FERRITIN 16 03/15/2020   Attestation Statements:   Reviewed by clinician on day of visit: allergies, medications, problem list, medical history, surgical history, family history, social history, and previous encounter notes.  Time spent on visit including pre-visit chart review and post-visit care and charting was 33 minutes.   I, Water quality scientist, CMA, am acting as Location manager for [employer], [place].  I have reviewed the above documentation for accuracy and  completeness, and I agree with the above. -  Georgianne Fick, FNP

## 2020-07-17 ENCOUNTER — Encounter (INDEPENDENT_AMBULATORY_CARE_PROVIDER_SITE_OTHER): Payer: Self-pay | Admitting: Family Medicine

## 2020-07-17 DIAGNOSIS — R7303 Prediabetes: Secondary | ICD-10-CM | POA: Insufficient documentation

## 2020-07-18 ENCOUNTER — Ambulatory Visit (INDEPENDENT_AMBULATORY_CARE_PROVIDER_SITE_OTHER): Payer: BC Managed Care – PPO | Admitting: Family Medicine

## 2020-07-28 ENCOUNTER — Other Ambulatory Visit: Payer: Self-pay

## 2020-07-28 ENCOUNTER — Ambulatory Visit (INDEPENDENT_AMBULATORY_CARE_PROVIDER_SITE_OTHER): Payer: BC Managed Care – PPO | Admitting: Adult Health

## 2020-07-28 ENCOUNTER — Encounter (INDEPENDENT_AMBULATORY_CARE_PROVIDER_SITE_OTHER): Payer: Self-pay | Admitting: Adult Health

## 2020-07-28 VITALS — BP 119/77 | HR 55 | Temp 98.7°F | Ht 65.0 in | Wt 297.0 lb

## 2020-07-28 DIAGNOSIS — I10 Essential (primary) hypertension: Secondary | ICD-10-CM

## 2020-07-28 DIAGNOSIS — R7303 Prediabetes: Secondary | ICD-10-CM

## 2020-07-28 DIAGNOSIS — R790 Abnormal level of blood mineral: Secondary | ICD-10-CM

## 2020-07-28 DIAGNOSIS — E782 Mixed hyperlipidemia: Secondary | ICD-10-CM

## 2020-07-28 DIAGNOSIS — Z6841 Body Mass Index (BMI) 40.0 and over, adult: Secondary | ICD-10-CM

## 2020-07-28 DIAGNOSIS — Z9189 Other specified personal risk factors, not elsewhere classified: Secondary | ICD-10-CM

## 2020-07-28 DIAGNOSIS — E559 Vitamin D deficiency, unspecified: Secondary | ICD-10-CM | POA: Diagnosis not present

## 2020-07-28 NOTE — Progress Notes (Signed)
Chief Complaint:   OBESITY Pamela [name] is here to discuss her progress with her obesity treatment plan along with follow-up of her obesity related diagnoses. Pamela [name] is on keeping a food journal and adhering to recommended goals of 1500 calories and 115 g protein and states she is following her eating plan approximately 15% of the time. Pamela [name] states she is walking 30-45 minutes 7 times per week.  Today's visit was #: 8 Starting weight: 333 lbs Starting date: 03/15/2020 Today's weight: 297 lbs Today's date: 07/28/2020 Total lbs lost to date: 36 Total lbs lost since last in-office visit: 3  Interim History: Pamela [name] has stopped weighing at home, which has helped decrease anxiety.  She had to consume higher CHO/sugar meals during work trip to [place] ([employer]). She continues to enjoy regular walking.  Subjective:   1. Vitamin D deficiency Pamela [name]'s Vitamin D level was 22.9 on 03/15/2020. She is currently taking prescription vitamin D 50,000 IU each week. She denies nausea, vomiting or muscle weakness.  2. Pre-diabetes Pamela [name] denies first degree family history of diabetes.  She is not on any BG lowering agents.  Lab Results  Component Value Date   HGBA1C 6.4 (H) 03/15/2020   Lab Results  Component Value Date   INSULIN 30.0 (H) 03/15/2020   3. Essential hypertension Pamela [name]'s BP/HR are stable at OV.  She is on Hyzaar 100-12.5 mg QD.  BP Readings from Last 3 Encounters:  07/28/20 119/77  07/07/20 (!) 132/101  06/14/20 124/75   4. Mixed hyperlipidemia Pamela [name] is not on a statin.  She denies tobacco/vape use.  Lab Results  Component Value Date   ALT 11 03/15/2020   AST 11 03/15/2020   ALKPHOS 80 03/15/2020   BILITOT 0.2 03/15/2020   Lab Results  Component Value Date   CHOL 213 (H) 03/15/2020   HDL 66 03/15/2020   LDLCALC 128 (H) 03/15/2020   TRIG 106 03/15/2020   CHOLHDL 3.2 03/15/2020   5. Low ferritin Pamela [name] is on oral iron replacement and denies worsening  constipation.  6. At risk for diabetes mellitus Pamela [name] is at higher than average risk for developing diabetes due to obesity.   Assessment/Plan:   1. Vitamin D deficiency Low Vitamin D level contributes to fatigue and are associated with obesity, breast, and colon cancer. She agrees to continue to take prescription Vitamin D @50 ,000 IU every week and will follow-up for routine testing of Vitamin D, at least 2-3 times per year to avoid over-replacement. Check labs today.  - VITAMIN D 25 Hydroxy (Vit-D Deficiency, Fractures)  2. Pre-diabetes Pamela [name] will continue to work on weight loss, exercise, and decreasing simple carbohydrates to help decrease the risk of diabetes.  Check labs today.  - Hemoglobin A1c - Insulin, random  3. Essential hypertension Pamela [name] is working on healthy weight loss and exercise to improve blood pressure control. We will watch for signs of hypotension as she continues her lifestyle modifications. Check labs today.  - Comprehensive metabolic panel  4. Mixed hyperlipidemia Cardiovascular risk and specific lipid/LDL goals reviewed.  We discussed several lifestyle modifications today and Pamela [name] will continue to work on diet, exercise and weight loss efforts. Orders and follow up as documented in patient record.  Check labs today.  Counseling Intensive lifestyle modifications are the first line treatment for this issue. Dietary changes: Increase soluble fiber. Decrease simple carbohydrates. Exercise changes: Moderate to vigorous-intensity aerobic activity 150 minutes per week if tolerated. Lipid-lowering medications: see documented in medical record.  -  Lipid panel  5. Low ferritin Check labs today.  - Anemia panel  6. At risk for diabetes mellitus Pamela [name] was given approximately 15 minutes of diabetes education and counseling today. We discussed intensive lifestyle modifications today with an emphasis on weight loss as well as increasing exercise and decreasing  simple carbohydrates in her diet. We also reviewed medication options with an emphasis on risk versus benefit of those discussed.   Repetitive spaced learning was employed today to elicit superior memory formation and behavioral change.  7. Obesity, current BMI 49.5  Pamela [name] is currently in the action stage of change. As such, her goal is to continue with weight loss efforts. She has agreed to keeping a food journal and adhering to recommended goals of 1500 calories and 115 g protein.   Make a list of "cycle foods"- have 1 serving/day for 5 days during menstrual cycle. Pack appropriate meals/snacks in cooler bag and take on work trips.  Exercise goals:  As is  Behavioral modification strategies: increasing lean protein intake, decreasing simple carbohydrates, meal planning and cooking strategies, keeping healthy foods in the home, planning for success, and keeping a strict food journal.  Pamela [name] has agreed to follow-up with our clinic in 3 weeks. She was informed of the importance of frequent follow-up visits to maximize her success with intensive lifestyle modifications for her multiple health conditions.   Pamela [name] was informed we would discuss her lab results at her next visit unless there is a critical issue that needs to be addressed sooner. Pamela [name] agreed to keep her next visit at the agreed upon time to discuss these results.  Objective:   Blood pressure 119/77, pulse (!) 55, temperature 98.7 F (37.1 C), height 5\' 5"  (1.651 m), weight 297 lb (134.7 kg), SpO2 98 %. Body mass index is 49.42 kg/m.  General: Cooperative, alert, well developed, in no acute distress. HEENT: Conjunctivae and lids unremarkable. Cardiovascular: Regular rhythm.  Lungs: Normal work of breathing. Neurologic: No focal deficits.   Lab Results  Component Value Date   CREATININE 0.96 03/15/2020   BUN 10 03/15/2020   NA 139 03/15/2020   K 3.6 03/15/2020   CL 100 03/15/2020   CO2 24 03/15/2020   Lab Results   Component Value Date   ALT 11 03/15/2020   AST 11 03/15/2020   ALKPHOS 80 03/15/2020   BILITOT 0.2 03/15/2020   Lab Results  Component Value Date   HGBA1C 6.4 (H) 03/15/2020   Lab Results  Component Value Date   INSULIN 30.0 (H) 03/15/2020   Lab Results  Component Value Date   TSH 2.170 03/15/2020   Lab Results  Component Value Date   CHOL 213 (H) 03/15/2020   HDL 66 03/15/2020   LDLCALC 128 (H) 03/15/2020   TRIG 106 03/15/2020   CHOLHDL 3.2 03/15/2020   Lab Results  Component Value Date   WBC 5.8 03/15/2020   HGB 12.2 03/15/2020   HCT 37.5 03/15/2020   MCV 85 03/15/2020   PLT 313 03/15/2020   Lab Results  Component Value Date   IRON 33 03/15/2020   TIBC 374 03/15/2020   FERRITIN 16 03/15/2020    Attestation Statements:   Reviewed by clinician on day of visit: allergies, medications, problem list, medical history, surgical history, family history, social history, and previous encounter notes.  Coral Ceo, CMA, am acting as transcriptionist for Mina Marble, NP.  I have reviewed the above documentation for accuracy and completeness, and I agree with the above. -  Pamela [name] d. Pamela Borden, NP-C

## 2020-07-29 ENCOUNTER — Encounter (INDEPENDENT_AMBULATORY_CARE_PROVIDER_SITE_OTHER): Payer: Self-pay | Admitting: Adult Health

## 2020-07-29 LAB — ANEMIA PANEL
Ferritin: 57 ng/mL (ref 15–150)
Folate, Hemolysate: 380 ng/mL
Folate, RBC: 960 ng/mL (ref >498)
Hematocrit: 39.6 % (ref 34.0–46.6)
Iron Saturation: 23 % (ref 15–55)
Iron: 63 ug/dL (ref 27–159)
Retic Ct Pct: 1.8 % (ref 0.6–2.6)
Total Iron Binding Capacity: 274 ug/dL (ref 250–450)
UIBC: 211 ug/dL (ref 131–425)
Vitamin B-12: 223 pg/mL — ABNORMAL LOW (ref 232–1245)

## 2020-07-29 LAB — COMPREHENSIVE METABOLIC PANEL
ALT: 11 IU/L (ref 0–32)
AST: 14 IU/L (ref 0–40)
Albumin/Globulin Ratio: 1.3 (ref 1.2–2.2)
Albumin: 4 g/dL (ref 3.8–4.8)
Alkaline Phosphatase: 84 IU/L (ref 44–121)
BUN/Creatinine Ratio: 14 (ref 9–23)
BUN: 12 mg/dL (ref 6–24)
Bilirubin Total: 0.4 mg/dL (ref 0.0–1.2)
CO2: 23 mmol/L (ref 20–29)
Calcium: 9.2 mg/dL (ref 8.7–10.2)
Chloride: 101 mmol/L (ref 96–106)
Creatinine, Ser: 0.85 mg/dL (ref 0.57–1.00)
Globulin, Total: 3 g/dL (ref 1.5–4.5)
Glucose: 115 mg/dL — ABNORMAL HIGH (ref 65–99)
Potassium: 4 mmol/L (ref 3.5–5.2)
Sodium: 139 mmol/L (ref 134–144)
Total Protein: 7 g/dL (ref 6.0–8.5)
eGFR: 83 mL/min/{1.73_m2} (ref >59)

## 2020-07-29 LAB — LIPID PANEL
Chol/HDL Ratio: 3.5 ratio (ref 0.0–4.4)
Cholesterol, Total: 179 mg/dL (ref 100–199)
HDL: 51 mg/dL (ref >39)
LDL Chol Calc (NIH): 111 mg/dL — ABNORMAL HIGH (ref 0–99)
Triglycerides: 90 mg/dL (ref 0–149)
VLDL Cholesterol Cal: 17 mg/dL (ref 5–40)

## 2020-07-29 LAB — HEMOGLOBIN A1C
Est. average glucose Bld gHb Est-mCnc: 137 mg/dL
Hgb A1c MFr Bld: 6.4 % — ABNORMAL HIGH (ref 4.8–5.6)

## 2020-07-29 LAB — INSULIN, RANDOM: INSULIN: 15 u[IU]/mL (ref 2.6–24.9)

## 2020-07-29 LAB — VITAMIN D 25 HYDROXY (VIT D DEFICIENCY, FRACTURES): Vit D, 25-Hydroxy: 68.5 ng/mL (ref 30.0–100.0)

## 2020-08-15 ENCOUNTER — Ambulatory Visit (INDEPENDENT_AMBULATORY_CARE_PROVIDER_SITE_OTHER): Payer: BC Managed Care – PPO | Admitting: Family Medicine

## 2020-08-15 ENCOUNTER — Encounter (INDEPENDENT_AMBULATORY_CARE_PROVIDER_SITE_OTHER): Payer: Self-pay | Admitting: Family Medicine

## 2020-08-15 ENCOUNTER — Other Ambulatory Visit: Payer: Self-pay

## 2020-08-15 VITALS — BP 126/82 | HR 67 | Temp 98.5°F | Ht 65.0 in | Wt 302.0 lb

## 2020-08-15 DIAGNOSIS — Z9189 Other specified personal risk factors, not elsewhere classified: Secondary | ICD-10-CM

## 2020-08-15 DIAGNOSIS — I10 Essential (primary) hypertension: Secondary | ICD-10-CM

## 2020-08-15 DIAGNOSIS — E559 Vitamin D deficiency, unspecified: Secondary | ICD-10-CM

## 2020-08-15 DIAGNOSIS — E7849 Other hyperlipidemia: Secondary | ICD-10-CM | POA: Diagnosis not present

## 2020-08-15 DIAGNOSIS — R7303 Prediabetes: Secondary | ICD-10-CM

## 2020-08-15 DIAGNOSIS — E538 Deficiency of other specified B group vitamins: Secondary | ICD-10-CM

## 2020-08-15 DIAGNOSIS — Z6841 Body Mass Index (BMI) 40.0 and over, adult: Secondary | ICD-10-CM

## 2020-08-15 MED ORDER — VITAMIN D3 125 MCG (5000 UT) PO TABS: ORAL_TABLET | ORAL | 3 refills | Status: DC

## 2020-08-15 MED ORDER — CYANOCOBALAMIN 500 MCG PO TABS: ORAL_TABLET | ORAL | Status: DC

## 2020-08-15 MED ORDER — METFORMIN HCL 500 MG PO TABS: 500.0000 mg | ORAL_TABLET | Freq: Two times a day (BID) | ORAL | 0 refills | Status: DC

## 2020-08-15 NOTE — Patient Instructions (Signed)
The 10-year ASCVD risk score Mikey Bussing DC Brooke Bonito., et al., 2013) is: 3.2%   Values used to calculate the score:     Age: 50 years     Sex: Female     Is Non-Hispanic African American: Yes     Diabetic: No     Tobacco smoker: No     Systolic Blood Pressure: 659 mmHg     Is BP treated: Yes     HDL Cholesterol: 51 mg/dL     Total Cholesterol: 179 mg/dL

## 2020-08-19 HISTORY — PX: COLONOSCOPY: SHX174

## 2020-08-29 NOTE — Progress Notes (Signed)
Chief Complaint:   OBESITY Pamela [name] is here to discuss her progress with her obesity treatment plan along with follow-up of her obesity related diagnoses.   Today's visit was #: 9 Starting weight: 333 lbs Starting date: 03/15/2020 Today's weight: 302 lbs Today's date: 08/15/2020 Weight change since last visit: +5 lbs Total lbs lost to date: 31 lbs Body mass index is 50.26 kg/m.  Total weight loss percentage to date: -9.31%  Interim History:  Pamela [name] says she was on her menstrual cycle and was traveling for 1 week in [place] for work.  No upcoming travel.  Using MFP, but has not logged anything in 2 weeks.  Also here to review labs drawn at last office visit.  Plan:  At next office visit, bring log/sheet/notebook and write down date, calories, and grams of protein daily.  Current Meal Plan: keeping a food journal and adhering to recommended goals of 1500 calories and 115 grams of protein for 10% of the time.  Current Exercise Plan: Walking for 20-30 minutes 3 times per week.  Assessment/Plan:   Medications Discontinued During This Encounter  Medication Reason   Vitamin D, Ergocalciferol, (DRISDOL) 1.25 MG (50000 UNIT) CAPS capsule    Meds ordered this encounter  Medications   metFORMIN (GLUCOPHAGE) 500 MG tablet    Sig: Take 1 tablet (500 mg total) by mouth 2 (two) times daily with a meal.    Dispense:  60 tablet    Refill:  0    Ov for rf; 30 d supply   Cholecalciferol (VITAMIN D3) 125 MCG (5000 UT) TABS    Sig: 5,000 IU OTC vitamin D3 daily.    Dispense:  90 tablet    Refill:  3   vitamin B-12 (CYANOCOBALAMIN) 500 MCG tablet    Sig: 400-500 mcg qd otc supplement   1. Prediabetes Not at goal. Goal is HgbA1c < 5.7.  Medication: metformin 500 mg twice daily.  She is down 31 pounds.  No change in A1c.  Plan:  Discussed labs with patient today.  Start metformin 500 mg twice daily after risks/benefits of medication discussed with patient.  Handouts on prediabetes and  metformin given to patient.  She will continue to focus on protein-rich, low simple carbohydrate foods. We reviewed the importance of hydration, regular exercise for stress reduction, and restorative sleep.   Lab Results  Component Value Date   HGBA1C 6.4 (H) 07/28/2020   Lab Results  Component Value Date   INSULIN 15.0 07/28/2020   INSULIN 30.0 (H) 03/15/2020   - Start metFORMIN (GLUCOPHAGE) 500 MG tablet; Take 1 tablet (500 mg total) by mouth 2 (two) times daily with a meal.  Dispense: 60 tablet; Refill: 0  2. Other hyperlipidemia Course: Not at goal. Lipid-lowering medications: None.  Labs reviewed.  Has been eating much better over the past 2-3 months.  Plan:  Discussed labs with patient today.  Improved LDL and TG.  No need for medications.  Follow prudent nutritional plan and increase exercise.  Dietary changes: Increase soluble fiber, decrease simple carbohydrates, decrease saturated fat. Exercise changes: Moderate to vigorous-intensity aerobic activity 150 minutes per week or as tolerated. We will continue to monitor along with PCP/specialists as it pertains to her weight loss journey.  Lab Results  Component Value Date   CHOL 179 07/28/2020   HDL 51 07/28/2020   LDLCALC 111 (H) 07/28/2020   TRIG 90 07/28/2020   CHOLHDL 3.5 07/28/2020   Lab Results  Component Value Date  ALT 11 07/28/2020   AST 14 07/28/2020   ALKPHOS 84 07/28/2020   BILITOT 0.4 07/28/2020   The 10-year ASCVD risk score Mikey Bussing DC Jr., et al., 2013) is: 3.2%   Values used to calculate the score:     Age: 50 years     Sex: Female     Is Non-Hispanic African American: Yes     Diabetic: No     Tobacco smoker: No     Systolic Blood Pressure: 123XX123 mmHg     Is BP treated: Yes     HDL Cholesterol: 51 mg/dL     Total Cholesterol: 179 mg/dL  3. Essential hypertension Not at goal. Medications: Hyzaar 100-12 mg daily.    Plan:  Discussed labs with patient today.  At goal.  Continue medications.  CMP stable.   Avoid buying foods that are: processed, frozen, or prepackaged to avoid excess salt. We will watch for signs of hypotension as she continues lifestyle modifications.  BP Readings from Last 3 Encounters:  08/15/20 126/82  07/28/20 119/77  07/07/20 (!) 132/101   Lab Results  Component Value Date   CREATININE 0.85 07/28/2020   4. Vitamin D deficiency At goal.  She is taking vitamin D 5,000 IU daily.  Plan:  Discussed labs with patient today.  Stop prescription vitamin D and start OTC vitamin D 5,000 IU daily.  Counseling done.  Will change from 50,000 weekly to 35,000 weekly.  Continue current OTC vitamin D supplementation.  Follow-up for routine testing of Vitamin D, at least 2-3 times per year to avoid over-replacement.  Lab Results  Component Value Date   VD25OH 68.5 07/28/2020   VD25OH 22.9 (L) 03/15/2020   - Start cholecalciferol (VITAMIN D3) 125 MCG (5000 UT) TABS; 5,000 IU OTC vitamin D3 daily.  Dispense: 90 tablet; Refill: 3  5. B12 deficiency Lab Results  Component Value Date   VITAMINB12 223 (L) 07/28/2020   Supplementation: Endorses fatigue, low energy in general, which has been improving with meal plan and weight loss.  Plan:  New.  Discussed labs with patient today.  B12 low prior and even lower now.  Start 400-500 mcg B12 daily, especially with adding metformin.  Counseling done.  Recheck in 3-4 months.  - Start vitamin B-12 (CYANOCOBALAMIN) 500 MCG tablet; 400-500 mcg qd otc supplement  6. At risk for diabetes mellitus - Pamela [name] was given diabetes prevention education and counseling today of more than 23 minutes.  - Counseled patient on pathophysiology of disease and meaning/ implication of lab results.  - Reviewed how certain foods can either stimulate or inhibit insulin release, and subsequently affect hunger pathways  - Importance of following a healthy meal plan with limiting amounts of simple carbohydrates discussed with patient - Effects of regular aerobic  exercise on blood sugar regulation reviewed and encouraged an eventual goal of 30 min 5d/week or more as a minimum.  - Briefly discussed treatment options, which always include dietary and lifestyle modification as first line.   - Handouts provided at patient's desire and/or told to go online to the American Diabetes Association website for further information.  7. Class 3 severe obesity with serious comorbidity and body mass index (BMI) of 50.0 to 59.9 in adult, unspecified obesity type [facility])  Course: Pamela [name] is currently in the action stage of change. As such, her goal is to continue with weight loss efforts.   Nutrition goals: She has agreed to keeping a food journal and adhering to recommended goals of 1500 calories  and 115 grams of protein.   Exercise goals: For substantial health benefits, adults should do at least 150 minutes (2 hours and 30 minutes) a week of moderate-intensity, or 75 minutes (1 hour and 15 minutes) a week of vigorous-intensity aerobic physical activity, or an equivalent combination of moderate- and vigorous-intensity aerobic activity. Aerobic activity should be performed in episodes of at least 10 minutes, and preferably, it should be spread throughout the week.  Behavioral modification strategies: meal planning and cooking strategies and planning for success.  Pamela [name] has agreed to follow-up with our clinic in 2-3 weeks. She was informed of the importance of frequent follow-up visits to maximize her success with intensive lifestyle modifications for her multiple health conditions.   Objective:   Blood pressure 126/82, pulse 67, temperature 98.5 F (36.9 C), height '5\' 5"'$  (1.651 m), weight (!) 302 lb (137 kg), SpO2 99 %. Body mass index is 50.26 kg/m.  General: Cooperative, alert, well developed, in no acute distress. HEENT: Conjunctivae and lids unremarkable. Cardiovascular: Regular rhythm.  Lungs: Normal work of breathing. Neurologic: No focal deficits.   Lab  Results  Component Value Date   CREATININE 0.85 07/28/2020   BUN 12 07/28/2020   NA 139 07/28/2020   K 4.0 07/28/2020   CL 101 07/28/2020   CO2 23 07/28/2020   Lab Results  Component Value Date   ALT 11 07/28/2020   AST 14 07/28/2020   ALKPHOS 84 07/28/2020   BILITOT 0.4 07/28/2020   Lab Results  Component Value Date   HGBA1C 6.4 (H) 07/28/2020   HGBA1C 6.4 (H) 03/15/2020   Lab Results  Component Value Date   INSULIN 15.0 07/28/2020   INSULIN 30.0 (H) 03/15/2020   Lab Results  Component Value Date   TSH 2.170 03/15/2020   Lab Results  Component Value Date   CHOL 179 07/28/2020   HDL 51 07/28/2020   LDLCALC 111 (H) 07/28/2020   TRIG 90 07/28/2020   CHOLHDL 3.5 07/28/2020   Lab Results  Component Value Date   VD25OH 68.5 07/28/2020   VD25OH 22.9 (L) 03/15/2020   Lab Results  Component Value Date   WBC 5.8 03/15/2020   HGB 12.2 03/15/2020   HCT 39.6 07/28/2020   MCV 85 03/15/2020   PLT 313 03/15/2020   Lab Results  Component Value Date   IRON 63 07/28/2020   TIBC 274 07/28/2020   FERRITIN 57 07/28/2020   Attestation Statements:   Reviewed by clinician on day of visit: allergies, medications, problem list, medical history, surgical history, family history, social history, and previous encounter notes.  I, Water quality scientist, CMA, am acting as Location manager for [employer], DO.  I have reviewed the above documentation for accuracy and completeness, and I agree with the above. Marjory Sneddon, D.O.  The [place] was signed into law in 2016 which includes the topic of electronic health records.  This provides immediate access to information in MyChart.  This includes consultation notes, operative notes, office notes, lab results and pathology reports.  If you have any questions about what you read please let us know at your next visit so we can discuss your concerns and take corrective action if need be.  We are right here with you.

## 2020-09-05 ENCOUNTER — Encounter: Payer: Self-pay | Admitting: Neurology

## 2020-09-05 ENCOUNTER — Other Ambulatory Visit: Payer: Self-pay

## 2020-09-05 ENCOUNTER — Ambulatory Visit: Payer: BC Managed Care – PPO | Admitting: Neurology

## 2020-09-05 VITALS — BP 141/83 | HR 72 | Ht 65.0 in | Wt 304.0 lb

## 2020-09-05 DIAGNOSIS — Z9989 Dependence on other enabling machines and devices: Secondary | ICD-10-CM

## 2020-09-05 DIAGNOSIS — G4733 Obstructive sleep apnea (adult) (pediatric): Secondary | ICD-10-CM

## 2020-09-05 NOTE — Progress Notes (Signed)
Subjective:    Patient ID: Pamela [name] is a 50 y.o. female.  HPI    Interim history:   Ms. Lecount is a 50 year old right-handed woman with an underlying medical history of hypertension, prediabetes, ADHD, anxiety, asthma, back pain, irritable bowel syndrome, and morbid obesity with a BMI of over 64, who presents for follow-up consultation of her obstructive sleep apnea after interim testing and starting CPAP therapy.  The patient is unaccompanied today.  I first met her at the request of Dr. Juleen China on 04/12/2020, at which time she reported snoring and excessive daytime somnolence.  She was advised to proceed with a sleep study.  She had a baseline sleep study on 05/03/2020 which showed moderate to severe obstructive sleep apnea, total AHI was 17.1/h, REM AHI 55.5/h, O2 nadir 71%, sleep efficiency was 63.6%, wake after sleep onset elevated at 132.5 minutes.  Stage REM was 15.7%.  She had no significant PLM's or EKG changes.  She was advised to return for a CPAP titration study.  She had this on 06/08/2020.  She was fitted with a medium nasal mask and CPAP was titrated from 5 cm to a final pressure of 12 cm.  On the final pressure of 12 cm her AHI was 0/h with nonsupine REM sleep achieved, O2 nadir 93%.  Based on the test results she was advised to start with home CPAP therapy at a pressure of 12 cm.  Her set up date was 08/03/2020.  Today, 09/05/2020: I reviewed her CPAP compliance data from 08/06/2020 through 09/04/2020, which is a total of 30 days, during which time she used her machine every night with percent use days greater than 4 hours at 90%, indicating excellent compliance with an average usage of 6.8 hours, residual AHI at goal at 0.8/h, pressure at 12 cm, leak on the low side with a 95th percentile at 5.3 L/min.  She reports doing well.  She has adjusted well to treatment and reports improvement in her daytime somnolence and sleep quality as well as sleep consolidation.  She is highly motivated  to continue with treatment.  She has been using a nasal mask with good success.  Sometimes she notices the leak when she shifts or turns.  She has not had much in the way of mouth dryness.  When she increase the moisture, it became too wet.  She has the moisture level on 3.  She has an iBreeze 20A CPAP machine.  She continues to work on weight loss.  She is currently somewhat plateaued with her weight loss.  The patient's allergies, current medications, family history, past medical history, past social history, past surgical history and problem list were reviewed and updated as appropriate.   Previously:   04/12/20: (She) reports snoring and excessive daytime somnolence.  I reviewed your office note from 03/15/2020.  Her Epworth sleepiness score is 6 out of 24 today but is likely an underestimation as she takes a nap every day and feels like she cannot function without a nap on a daily basis, may sleep longer if she does not have an alarm set or if her husband does not wake her up.  Fatigue severity score is 22 out of 63.  Her snoring has become worse as she has gained weight over time.  Her husband has noted pauses that can be 12 or 15 seconds long when she is asleep.  She has woken up rarely with a sense of gasping or abruptly with a startle.  She goes  to bed around 10 and does not have trouble falling asleep or staying asleep, rise time is around 6 AM but she does not wake up fully rested.  She is a restless sleeper but denies any significant restless legs symptoms.  She has a history of sleep talking.  She is not aware of any family history of sleep apnea.  She currently works from home, she is an Tourist information centre manager.  She takes a nap typically around 2:30 PM, generally anywhere between noon and 3 PM and then schedules afternoon appointments for meetings on Zoom.  She drinks caffeine in the form of coffee, 1 cup/day on average, drinks alcohol occasionally, on the weekends, she is a non-smoker.  She lives with her husband  and 70 year old son who is getting ready to move out.  They have no pets in the household, she has several allergies including pet allergies.  She has a TV in the bedroom but does not watch TV at night.  She sleeps on her sides or stomach, not on her back typically.  Her Past Medical History Is Significant For: Past Medical History:  Diagnosis Date   ADHD    Anxiety    Asthma    Back pain    Constipation    GERD (gastroesophageal reflux disease)    Hypertension    IBS (irritable bowel syndrome)    Joint pain    Lower extremity edema    Obesity    Prediabetes     Her Past Surgical History Is Significant For: Past Surgical History:  Procedure Laterality Date   CESAREAN SECTION  1998   OVARIAN CYST REMOVAL     TUBAL LIGATION  2008    Her Family History Is Significant For: Family History  Problem Relation Age of Onset   Asthma Other    Heart failure Other    Hypertension Other    Hypertension Mother    Hypertension Father    Heart disease Father    Alcoholism Father     Her Social History Is Significant For: Social History   Socioeconomic History   Marital status: Married    Spouse name: Not on file   Number of children: Not on file   Years of education: Not on file   Highest education level: Not on file  Occupational History   Occupation: Educator   Tobacco Use   Smoking status: Never   Smokeless tobacco: Never  Vaping Use   Vaping Use: Never used  Substance and Sexual Activity   Alcohol use: Not Currently   Drug use: No   Sexual activity: Yes    Birth control/protection: Surgical    Comment: tubal ligation  Other Topics Concern   Not on file  Social History Narrative   Lives at home with husband   Right handed   Caffeine: 1 cup/day   Social Determinants of Health   Financial Resource Strain: Not on file  Food Insecurity: Not on file  Transportation Needs: Not on file  Physical Activity: Not on file  Stress: Not on file  Social Connections: Not  on file    Her Allergies Are:  Allergies  Allergen Reactions   Molds & Smuts   :   Her Current Medications Are:  Outpatient Encounter Medications as of 09/05/2020  Medication Sig   cetirizine (ZYRTEC) 10 MG tablet Take 10 mg by mouth daily.   Cholecalciferol (VITAMIN D3) 125 MCG (5000 UT) TABS 5,000 IU OTC vitamin D3 daily.   losartan-hydrochlorothiazide (HYZAAR) 100-12.5 MG tablet  100 tablets.   metFORMIN (GLUCOPHAGE) 500 MG tablet Take 1 tablet (500 mg total) by mouth 2 (two) times daily with a meal.   montelukast (SINGULAIR) 10 MG tablet TAKE 1 TABLET(10 MG) BY MOUTH AT BEDTIME   pantoprazole (PROTONIX) 40 MG tablet pantoprazole 40 mg tablet,delayed release  TK 1 T PO D   UNABLE TO FIND Take 65 mg by mouth every other day. Med Name: Nature's Bounty Iron   vitamin B-12 (CYANOCOBALAMIN) 500 MCG tablet 400-500 mcg qd otc supplement   No facility-administered encounter medications on file as of 09/05/2020.  :  Review of Systems:  Out of a complete 14 point review of systems, all are reviewed and negative with the exception of these symptoms as listed below:  Review of Systems  Neurological:        Patient states so far she is doing well on the CPAP machine. Setup date 08/03/20. She states during "normal weeks" she doesn't need naps. She notices better energy, well rested, no known snoring. She has traveled with it twice and neither roommates complained of snoring. She is doing better with learning how to move around with it at night.    Objective:  Neurological Exam  Physical Exam Physical Examination:   Vitals:   09/05/20 1412  BP: (!) 141/83  Pulse: 72    General Examination: The patient is a very pleasant 50 y.o. female in no acute distress. She appears well-developed and well-nourished and well groomed.   HEENT: Normocephalic, atraumatic, pupils are equal, round and reactive to light, extraocular tracking is well-preserved, face is symmetric with normal facial animation,  airway exam reveals stable findings, no significant mouth dryness.  Tongue protrudes centrally and palate elevates symmetrically.    Chest: Clear to auscultation without wheezing, rhonchi or crackles noted.   Heart: S1+S2+0, regular and normal with possible slight 1/6 systolic murmur noted.      Abdomen: Soft, non-tender and non-distended.   Extremities: There is no obvious edema.   Skin: Warm and dry without trophic changes noted.   Musculoskeletal: exam reveals no obvious joint deformities.   Neurologically: Mental status: The patient is awake, alert and oriented in all 4 spheres. Her immediate and remote memory, attention, language skills and fund of knowledge are appropriate. There is no evidence of aphasia, agnosia, apraxia or anomia. Speech is clear with normal prosody and enunciation. Thought process is linear. Mood is normal and affect is normal. Cranial nerves II - XII are as described above under HEENT exam. Motor exam: Normal bulk, strength and tone is noted. There is no obvious tremor.  Fine motor skills are grossly intact.   Cerebellar testing: No dysmetria or intention tremor. There is no truncal or gait ataxia. Sensory exam: intact to light touch in the upper and lower extremities.   Assessment and Plan:    In summary, Pamela N Perlow is a very pleasant 50 year old female with an underlying medical history of hypertension, prediabetes, ADHD, anxiety, asthma, back pain, irritable bowel syndrome, and morbid obesity with a BMI of over 50, who presents for follow-up consultation of her obstructive sleep apnea.  Baseline sleep study in March 2022 indicated moderate to severe obstructive sleep apnea with severe REM related sleep apnea.  She had a good/successful titration study in May 2022 and has established treatment with CPAP since end of June 2022.  She is compliant with treatment and highly commended for her treatment adherence.  She has benefited from treatment, in that her  daytime somnolence  is much improved, daytime energy and sleep quality are better.  She is highly motivated to continue with treatment.  She continues to work on weight loss.  She is advised to follow-up at this juncture routinely in 1 year, she may see one of our nurse practitioners as well as she is stable.  We can also review her download in the interim if the need arises and consider a pressure change or reduction as she continues to lose weight.  We talked about her sleep study results in detail as well as reviewed her compliance data today.  I answered all her questions today and she was in agreement with the plan.  I spent 30 minutes in total face-to-face time and in reviewing records during pre-charting, more than 50% of which was spent in counseling and coordination of care, reviewing test results, reviewing medications and treatment regimen and/or in discussing or reviewing the diagnosis of OSA, the prognosis and treatment options. Pertinent laboratory and imaging test results that were available during this visit with the patient were reviewed by me and considered in my medical decision making (see chart for details).

## 2020-09-05 NOTE — Patient Instructions (Signed)
It was nice to see you again today. You have done a great job using your new CPAP machine and I am glad to see that your numbers look great as in your apnea reduction and leak from the mask is on the low side.  I am also glad to hear that you have benefited from treatment.  Please continue using your CPAP regularly. While your insurance requires that you use CPAP at least 4 hours each night on 70% of the nights, I recommend, that you not skip any nights and use it throughout the night if you can. Getting used to CPAP and staying with the treatment long term does take time and patience and discipline. Untreated obstructive sleep apnea when it is moderate to severe can have an adverse impact on cardiovascular health and raise her risk for heart disease, arrhythmias, hypertension, congestive heart failure, stroke and diabetes. Untreated obstructive sleep apnea causes sleep disruption, nonrestorative sleep, and sleep deprivation. This can have an impact on your day to day functioning and cause daytime sleepiness and impairment of cognitive function, memory loss, mood disturbance, and problems focussing. Using CPAP regularly can improve these symptoms. At this juncture, you can follow-up in 1 year, you may see one of our nurse practitioners if you like.  They can also offer you a virtual visit if the need arises at the time.  Please continue with your weight loss journey and let us know if we need to look at your compliance download in the interim or consider reducing the pressure in the near future.

## 2020-09-06 ENCOUNTER — Ambulatory Visit (INDEPENDENT_AMBULATORY_CARE_PROVIDER_SITE_OTHER): Payer: BC Managed Care – PPO | Admitting: Family Medicine

## 2020-09-13 ENCOUNTER — Encounter (INDEPENDENT_AMBULATORY_CARE_PROVIDER_SITE_OTHER): Payer: Self-pay | Admitting: Family Medicine

## 2020-09-13 ENCOUNTER — Ambulatory Visit (INDEPENDENT_AMBULATORY_CARE_PROVIDER_SITE_OTHER): Payer: BC Managed Care – PPO | Admitting: Family Medicine

## 2020-09-13 ENCOUNTER — Other Ambulatory Visit: Payer: Self-pay

## 2020-09-13 VITALS — BP 117/60 | HR 54 | Temp 98.3°F | Ht 65.0 in | Wt 293.0 lb

## 2020-09-13 DIAGNOSIS — R7303 Prediabetes: Secondary | ICD-10-CM

## 2020-09-13 DIAGNOSIS — E559 Vitamin D deficiency, unspecified: Secondary | ICD-10-CM | POA: Diagnosis not present

## 2020-09-13 DIAGNOSIS — E538 Deficiency of other specified B group vitamins: Secondary | ICD-10-CM | POA: Diagnosis not present

## 2020-09-13 DIAGNOSIS — Z9189 Other specified personal risk factors, not elsewhere classified: Secondary | ICD-10-CM

## 2020-09-13 DIAGNOSIS — Z6841 Body Mass Index (BMI) 40.0 and over, adult: Secondary | ICD-10-CM

## 2020-09-13 MED ORDER — METFORMIN HCL 500 MG PO TABS: 500.0000 mg | ORAL_TABLET | Freq: Two times a day (BID) | ORAL | 0 refills | Status: DC

## 2020-09-13 NOTE — Progress Notes (Signed)
Chief Complaint:   OBESITY Pamela [name] is here to discuss her progress with her obesity treatment plan along with follow-up of her obesity related diagnoses. Pamela [name] is on keeping a food journal and adhering to recommended goals of 1500 calories and 115+ grams of protein daily and states she is following her eating plan approximately 80% of the time. Pamela [name] states she is doing 0 minutes 0 times per week.  Today's visit was #: 10 Starting weight: 333 lbs Starting date: 03/15/2020 Today's weight: 293 lbs Today's date: 09/13/2020 Total lbs lost to date: 40 Total lbs lost since last in-office visit: 9  Interim History: Morgane's last office visit was with me. She is newly convicted with getting healthy. She made great choices and meal planned with family gatherings and with eating out.   Subjective:   1. Prediabetes Pamela [name] had GI side effects the first 2-3 days of diarrhea and nausea, but has now resolved and she is tolerating her medications well. She notes it helps her with portion control but she is still snacking in the afternoons and evenings.  2. Vitamin D deficiency Pamela [name] is currently taking OTC vitamin D 5,000 IU each day. She denies nausea, vomiting or muscle weakness.  3. B12 deficiency Pamela [name] started B12 supplementation, and she is tolerating it well. She is taking B12 500 mcg.  4. At risk for diabetes mellitus Pamela [name] is at higher than average risk for developing diabetes due to obesity.   Assessment/Plan:   Medications Discontinued During This Encounter  Medication Reason   metFORMIN (GLUCOPHAGE) 500 MG tablet Reorder     Meds ordered this encounter  Medications   metFORMIN (GLUCOPHAGE) 500 MG tablet    Sig: Take 1 tablet (500 mg total) by mouth 2 (two) times daily with a meal.    Dispense:  60 tablet    Refill:  0    Ov for rf; 30 d supply     1. Prediabetes Pamela [name] will continue to work on weight loss, exercise, and decreasing simple carbohydrates to help decrease the  risk of diabetes. We will refill metformin for 1 month. She is to change her dose to lunch and dinner.  - metFORMIN (GLUCOPHAGE) 500 MG tablet; Take 1 tablet (500 mg total) by mouth 2 (two) times daily with a meal.  Dispense: 60 tablet; Refill: 0  2. Vitamin D deficiency Low Vitamin D level contributes to fatigue and are associated with obesity, breast, and colon cancer. Pamela [name] will continue Vitamin D 5,000 IU daily and will follow-up for routine testing of Vitamin D, at least 2-3 times per year to avoid over-replacement.  3. B12 deficiency The diagnosis was reviewed with the patient. Pamela [name] will continue her B12 supplementation. Counseling provided today, see below. We will recheck labs in 3 months. Orders and follow up as documented in patient record.  Counseling The body needs vitamin B12: to make red blood cells; to make DNA; and to help the nerves work properly so they can carry messages from the brain to the body.  The main causes of vitamin B12 deficiency include dietary deficiency, digestive diseases, pernicious anemia, and having a surgery in which part of the stomach or small intestine is removed.  Certain medicines can make it harder for the body to absorb vitamin B12. These medicines include: heartburn medications; some antibiotics; some medications used to treat diabetes, gout, and high cholesterol.  In some cases, there are no symptoms of this condition. If the condition leads to anemia or nerve damage,  various symptoms can occur, such as weakness or fatigue, shortness of breath, and numbness or tingling in your hands and feet.   Treatment:  May include taking vitamin B12 supplements.  Avoid alcohol.  Eat lots of healthy foods that contain vitamin B12: Beef, pork, chicken, [place], and organ meats, such as liver.  Seafood: This includes clams, rainbow trout, salmon, tuna, and haddock. Eggs.  Cereal and dairy products that are fortified: This means that vitamin B12 has been added to  the food.   4. At risk for diabetes mellitus Pamela [name] was given approximately 15 minutes of diabetes education and counseling today. We discussed intensive lifestyle modifications today with an emphasis on weight loss as well as increasing exercise and decreasing simple carbohydrates in her diet. We also reviewed medication options with an emphasis on risk versus benefit of those discussed.   Repetitive spaced learning was employed today to elicit superior memory formation and behavioral change.  5. Obesity with current BMI 48.9 Pamela [name] is currently in the action stage of change. As such, her goal is to continue with weight loss efforts. She has agreed to the Category 3 Plan but keeping a food journal and adhering to recommended goals of 1500-1700 calories and 115+ grams of protein daily.   Pamela [name] is to bring in her food log of calories and protein she is eating per day into her next office visit.  Exercise goals: As is.  Behavioral modification strategies: meal planning and cooking strategies and planning for success.  Pamela [name] has agreed to follow-up with our clinic in 2 weeks. She was informed of the importance of frequent follow-up visits to maximize her success with intensive lifestyle modifications for her multiple health conditions.   Objective:   Blood pressure 117/60, pulse (!) 54, temperature 98.3 F (36.8 C), height '5\' 5"'$  (1.651 m), weight 293 lb (132.9 kg), SpO2 98 %. Body mass index is 48.76 kg/m.  General: Cooperative, alert, well developed, in no acute distress. HEENT: Conjunctivae and lids unremarkable. Cardiovascular: Regular rhythm.  Lungs: Normal work of breathing. Neurologic: No focal deficits.   Lab Results  Component Value Date   CREATININE 0.85 07/28/2020   BUN 12 07/28/2020   NA 139 07/28/2020   K 4.0 07/28/2020   CL 101 07/28/2020   CO2 23 07/28/2020   Lab Results  Component Value Date   ALT 11 07/28/2020   AST 14 07/28/2020   ALKPHOS 84 07/28/2020    BILITOT 0.4 07/28/2020   Lab Results  Component Value Date   HGBA1C 6.4 (H) 07/28/2020   HGBA1C 6.4 (H) 03/15/2020   Lab Results  Component Value Date   INSULIN 15.0 07/28/2020   INSULIN 30.0 (H) 03/15/2020   Lab Results  Component Value Date   TSH 2.170 03/15/2020   Lab Results  Component Value Date   CHOL 179 07/28/2020   HDL 51 07/28/2020   LDLCALC 111 (H) 07/28/2020   TRIG 90 07/28/2020   CHOLHDL 3.5 07/28/2020   Lab Results  Component Value Date   VD25OH 68.5 07/28/2020   VD25OH 22.9 (L) 03/15/2020   Lab Results  Component Value Date   WBC 5.8 03/15/2020   HGB 12.2 03/15/2020   HCT 39.6 07/28/2020   MCV 85 03/15/2020   PLT 313 03/15/2020   Lab Results  Component Value Date   IRON 63 07/28/2020   TIBC 274 07/28/2020   FERRITIN 57 07/28/2020   Attestation Statements:   Reviewed by clinician on day of visit: allergies, medications, problem list,  medical history, surgical history, family history, social history, and previous encounter notes.   Wilhemena Durie, am acting as transcriptionist for [employer], DO.  I have reviewed the above documentation for accuracy and completeness, and I agree with the above. Marjory Sneddon, D.O.  The [place] was signed into law in 2016 which includes the topic of electronic health records.  This provides immediate access to information in MyChart.  This includes consultation notes, operative notes, office notes, lab results and pathology reports.  If you have any questions about what you read please let us know at your next visit so we can discuss your concerns and take corrective action if need be.  We are right here with you.

## 2020-09-13 NOTE — Patient Instructions (Signed)
Health Maintenance Due  Topic Date Due   HIV Screening  Never done   Hepatitis C Screening  Never done   TETANUS/TDAP  Never done   PAP SMEAR-Modifier  Never done   COLONOSCOPY (Pts 45-46yr Insurance coverage will need to be confirmed)  Never done   COVID-19 Vaccine (3 - Booster for Moderna series) 10/05/2019   Zoster Vaccines- Shingrix (1 of 2) Never done   INFLUENZA VACCINE  09/05/2020    Depression screen PHQ 2/9 03/15/2020  Decreased Interest 3  Down, Depressed, Hopeless 1  PHQ - 2 Score 4  Altered sleeping 3  Tired, decreased energy 3  Change in appetite 3  Feeling bad or failure about yourself  2  Trouble concentrating 1  Moving slowly or fidgety/restless 0  Suicidal thoughts 0  PHQ-9 Score 16  Difficult doing work/chores Somewhat difficult

## 2020-09-26 ENCOUNTER — Encounter (INDEPENDENT_AMBULATORY_CARE_PROVIDER_SITE_OTHER): Payer: Self-pay

## 2020-09-27 ENCOUNTER — Ambulatory Visit (INDEPENDENT_AMBULATORY_CARE_PROVIDER_SITE_OTHER): Payer: BC Managed Care – PPO | Admitting: Family Medicine

## 2020-09-27 ENCOUNTER — Other Ambulatory Visit: Payer: Self-pay

## 2020-09-27 ENCOUNTER — Encounter (INDEPENDENT_AMBULATORY_CARE_PROVIDER_SITE_OTHER): Payer: Self-pay | Admitting: Family Medicine

## 2020-09-27 VITALS — BP 115/78 | HR 65 | Temp 98.6°F | Ht 65.0 in | Wt 292.0 lb

## 2020-09-27 DIAGNOSIS — Z6841 Body Mass Index (BMI) 40.0 and over, adult: Secondary | ICD-10-CM | POA: Diagnosis not present

## 2020-09-27 DIAGNOSIS — Z9189 Other specified personal risk factors, not elsewhere classified: Secondary | ICD-10-CM

## 2020-09-27 DIAGNOSIS — J302 Other seasonal allergic rhinitis: Secondary | ICD-10-CM | POA: Diagnosis not present

## 2020-09-27 DIAGNOSIS — I10 Essential (primary) hypertension: Secondary | ICD-10-CM | POA: Diagnosis not present

## 2020-09-27 MED ORDER — MONTELUKAST SODIUM 10 MG PO TABS: ORAL_TABLET | ORAL | 0 refills | Status: DC

## 2020-09-28 NOTE — Progress Notes (Signed)
Chief Complaint:   OBESITY Pamela [name] is here to discuss her progress with her obesity treatment plan along with follow-up of her obesity related diagnoses. Pamela [name] is on the Category 3 Plan or keeping a food journal and adhering to recommended goals of 1500-1700 calories and 115+ grams of protein daily and states she is following her eating plan approximately 95% of the time. Pamela [name] states she is doing 0 minutes 0 times per week.  Today's visit was #: 11 Starting weight: 333 lbs Starting date: 03/15/2020 Today's weight: 292 lbs Today's date: 09/27/2020 Total lbs lost to date: 41 Total lbs lost since last in-office visit: 1  Interim History: Pamela [name] is using My[employer]itness Pal to log her food. She did not hit her protein goals in the past two weeks. She was under and occasionally over in calories.  Subjective:   1. Essential hypertension Pamela [name]'s blood pressure is at goal. She is taking Hyzaar.  2. Seasonal allergies Pamela [name] has seasonal allergies that have been more bothersome with the change of weather/season. She requests a refill of Singular.  3. At risk for malnutrition Pamela [name] is at increased risk for malnutrition.  Assessment/Plan:  No orders of the defined types were placed in this encounter.   Medications Discontinued During This Encounter  Medication Reason   montelukast (SINGULAIR) 10 MG tablet Reorder     Meds ordered this encounter  Medications   montelukast (SINGULAIR) 10 MG tablet    Sig: TAKE 1 TABLET(10 MG) BY MOUTH AT BEDTIME    Dispense:  30 tablet    Refill:  0      1. Essential hypertension - BP is at goal today. Pamela [name] will continue her prudent nutritional plan, decrease salt and continue with weight loss.  - Counseled Pamela [name] on pathophysiology of disease and discussed treatment plan, which always includes dietary and lifestyle modification as first line.   - Lifestyle changes such as following our low salt, heart healthy meal plan and engaging in a  regular exercise program discussed   - Avoid buying foods that are: processed, frozen, or prepackaged to avoid excess salt.  - Ambulatory blood pressure monitoring encouraged.  Reminded patient that if they ever feel poorly in any way, to check their blood pressure and pulse as well.  - We will continue to monitor closely alongside PCP/ specialists.  Pt reminded to also f/up with those individuals as instructed by them.   - We will continue to monitor symptoms as they relate to the her weight loss journey.   2. Seasonal allergies  - Seasonal and environmental allergies and pathophysiology of disease process discussed with patient.    - Preventative strategies as first line for management discussed.  I encouraged use of KN or N-95 mask use as well as sterile saline rinses such as Milta Deiters Med or AYR sinus rinses to be done twice daily and after any prolonged exposure to the environment or allergen.    It is best to use distilled water or previously boiled water     - If eyes are itchy or irritated feeling when your seasonal allergies get bad, ok to use Naphcon-A over-the-counter eyedrops as needed  - If continues to worsen despite preventative strategies, take over-the-counter meds such as Allegra, Xyzal etc daily during allergy seasons or nasal steroids such as Flonase, Rhinocort and the like  - We can consider Astelin nasal spray, if symptoms are not well controlled with OTC medications, nasal rinses and preventative strategies.  - Encouraged  to return to clinic or call the office today discussed further questions or concerns they may have.  - montelukast (SINGULAIR) 10 MG tablet; TAKE 1 TABLET(10 MG) BY MOUTH AT BEDTIME  Dispense: 30 tablet; Refill: 0  3. At risk for malnutrition Pamela [name] was given extensive malnutrition prevention education and counseling today of more than 9 minutes.  Counseled her that malnutrition refers to inappropriate nutrients or not the right balance of nutrients for  optimal health.  Discussed with Pamela [name] that it is absolutely possible to be malnourished but yet obese.  Risk factors, including but not limited to, inappropriate dietary choices, difficulty with obtaining food due to physical or financial limitations, and various physical and mental health conditions were reviewed with Pamela [name].   4. Obesity with curren BMI of 48.7 Pamela [name] is currently in the action stage of change. As such, her goal is to continue with weight loss efforts. She has agreed to the Category 3 Plan or keeping a food journal and adhering to recommended goals of 1500-1700 calories and 115+ grams of protein daily.   Pamela [name] would like to focus on increasing protein and journaling as she goes along.  Exercise goals: All adults should avoid inactivity. Some physical activity is better than none, and adults who participate in any amount of physical activity gain some health benefits.  Behavioral modification strategies: increasing lean protein intake, decreasing simple carbohydrates, and planning for success.  Pamela [name] has agreed to follow-up with our clinic in 3 weeks. She was informed of the importance of frequent follow-up visits to maximize her success with intensive lifestyle modifications for her multiple health conditions.   Objective:   Blood pressure 115/78, pulse 65, temperature 98.6 F (37 C), height '5\' 5"'$  (1.651 m), weight 292 lb (132.5 kg), SpO2 99 %. Body mass index is 48.59 kg/m.  General: Cooperative, alert, well developed, in no acute distress. HEENT: Conjunctivae and lids unremarkable. Cardiovascular: Regular rhythm.  Lungs: Normal work of breathing. Neurologic: No focal deficits.   Lab Results  Component Value Date   CREATININE 0.85 07/28/2020   BUN 12 07/28/2020   NA 139 07/28/2020   K 4.0 07/28/2020   CL 101 07/28/2020   CO2 23 07/28/2020   Lab Results  Component Value Date   ALT 11 07/28/2020   AST 14 07/28/2020   ALKPHOS 84 07/28/2020    BILITOT 0.4 07/28/2020   Lab Results  Component Value Date   HGBA1C 6.4 (H) 07/28/2020   HGBA1C 6.4 (H) 03/15/2020   Lab Results  Component Value Date   INSULIN 15.0 07/28/2020   INSULIN 30.0 (H) 03/15/2020   Lab Results  Component Value Date   TSH 2.170 03/15/2020   Lab Results  Component Value Date   CHOL 179 07/28/2020   HDL 51 07/28/2020   LDLCALC 111 (H) 07/28/2020   TRIG 90 07/28/2020   CHOLHDL 3.5 07/28/2020   Lab Results  Component Value Date   VD25OH 68.5 07/28/2020   VD25OH 22.9 (L) 03/15/2020   Lab Results  Component Value Date   WBC 5.8 03/15/2020   HGB 12.2 03/15/2020   HCT 39.6 07/28/2020   MCV 85 03/15/2020   PLT 313 03/15/2020   Lab Results  Component Value Date   IRON 63 07/28/2020   TIBC 274 07/28/2020   FERRITIN 57 07/28/2020   Attestation Statements:   Reviewed by clinician on day of visit: allergies, medications, problem list, medical history, surgical history, family history, social history, and previous encounter notes.  Wilhemena Durie, am acting as transcriptionist for [employer], DO.  I have reviewed the above documentation for accuracy and completeness, and I agree with the above. Marjory Sneddon, D.O.  The [place] was signed into law in 2016 which includes the topic of electronic health records.  This provides immediate access to information in MyChart.  This includes consultation notes, operative notes, office notes, lab results and pathology reports.  If you have any questions about what you read please let us know at your next visit so we can discuss your concerns and take corrective action if need be.  We are right here with you.

## 2020-10-20 ENCOUNTER — Ambulatory Visit (INDEPENDENT_AMBULATORY_CARE_PROVIDER_SITE_OTHER): Payer: BC Managed Care – PPO | Admitting: Adult Health

## 2020-10-20 ENCOUNTER — Encounter (INDEPENDENT_AMBULATORY_CARE_PROVIDER_SITE_OTHER): Payer: Self-pay | Admitting: Adult Health

## 2020-10-20 ENCOUNTER — Other Ambulatory Visit: Payer: Self-pay

## 2020-10-20 VITALS — BP 117/74 | HR 68 | Temp 98.1°F | Ht 65.0 in | Wt 290.0 lb

## 2020-10-20 DIAGNOSIS — R7303 Prediabetes: Secondary | ICD-10-CM | POA: Diagnosis not present

## 2020-10-20 DIAGNOSIS — Z6841 Body Mass Index (BMI) 40.0 and over, adult: Secondary | ICD-10-CM

## 2020-10-20 DIAGNOSIS — I10 Essential (primary) hypertension: Secondary | ICD-10-CM | POA: Diagnosis not present

## 2020-10-20 NOTE — Progress Notes (Signed)
Chief Complaint:   OBESITY Pamela [name] is here to discuss her progress with her obesity treatment plan along with follow-up of her obesity related diagnoses. Pamela [name] is on the Category 3 Plan and keeping a food journal and adhering to recommended goals of 1500-1700 calories and 115+ grams of protein and states she is following her eating plan approximately 70% of the time. Pamela [name] states she is not exercising regularly.  Today's visit was #: 12 Starting weight: 333 lbs Starting date: 03/15/2020 Today's weight: 290 lbs Today's date: 10/20/2020 Total lbs lost to date: 43 lbs Total lbs lost since last in-office visit: 2 lbs  Interim History: Pamela [name] has recently experienced an increase in carbohydrate cravings, especially sugar and fast food ([employer], [employer]).  Five days a week she will consume 1500-1700 calories per day; two days per week >1700 calories - usually by 100 calories.  She has been trying to choose healthier snack options.    Internal goal:   Metformin 500 mg at breakfast and lunch consistently. Walk 57mns 5 x week  Subjective:   1. Prediabetes On 07/28/2020, A1c was 6.4 with elevated BG of 115 and elevated insulin of 15.0. She is on metformin 500 mg BID - inconsistent with use - estimates to use BID 2 times per week.  Lab Results  Component Value Date   HGBA1C 6.4 (H) 07/28/2020   Lab Results  Component Value Date   INSULIN 15.0 07/28/2020   INSULIN 30.0 (H) 03/15/2020   2. Essential hypertension BP/HR excellent at OV. She denies acute cardiac symptoms.   She denies tobacco/vape use.  BP Readings from Last 3 Encounters:  10/20/20 117/74  09/27/20 115/78  09/13/20 117/60   Assessment/Plan:   1. Prediabetes Metformin 500 mg at breakfast and at lunch daily.  2. Essential hypertension Continue Hyzaar 100/12.5 mg daily.  3. Obesity with curren BMI of 48.4  K[place] currently in the action stage of change. As such, her goal is to continue with  weight loss efforts. She has agreed to keeping a food journal and adhering to recommended goals of 1500-1700 calories and 115 grams of protein.   Exercise goals:  Walk for 10 minutes 5 days per week.  Behavioral modification strategies: increasing lean protein intake, decreasing simple carbohydrates, meal planning and cooking strategies, keeping healthy foods in the home, planning for success, and keeping a strict food journal.  Check fasting labs at next office visit.  KBurundihas agreed to follow-up with our clinic in 3 weeks. She was informed of the importance of frequent follow-up visits to maximize her success with intensive lifestyle modifications for her multiple health conditions.   Objective:   Blood pressure 117/74, pulse 68, temperature 98.1 F (36.7 C), height '5\' 5"'$  (1.651 m), weight 290 lb (131.5 kg), SpO2 99 %. Body mass index is 48.26 kg/m.  General: Cooperative, alert, well developed, in no acute distress. HEENT: Conjunctivae and lids unremarkable. Cardiovascular: Regular rhythm.  Lungs: Normal work of breathing. Neurologic: No focal deficits.   Lab Results  Component Value Date   CREATININE 0.85 07/28/2020   BUN 12 07/28/2020   NA 139 07/28/2020   K 4.0 07/28/2020   CL 101 07/28/2020   CO2 23 07/28/2020   Lab Results  Component Value Date   ALT 11 07/28/2020   AST 14 07/28/2020   ALKPHOS 84 07/28/2020   BILITOT 0.4 07/28/2020   Lab Results  Component Value Date   HGBA1C 6.4 (H) 07/28/2020   HGBA1C  6.4 (H) 03/15/2020   Lab Results  Component Value Date   INSULIN 15.0 07/28/2020   INSULIN 30.0 (H) 03/15/2020   Lab Results  Component Value Date   TSH 2.170 03/15/2020   Lab Results  Component Value Date   CHOL 179 07/28/2020   HDL 51 07/28/2020   LDLCALC 111 (H) 07/28/2020   TRIG 90 07/28/2020   CHOLHDL 3.5 07/28/2020   Lab Results  Component Value Date   VD25OH 68.5 07/28/2020   VD25OH 22.9 (L) 03/15/2020   Lab Results  Component Value  Date   WBC 5.8 03/15/2020   HGB 12.2 03/15/2020   HCT 39.6 07/28/2020   MCV 85 03/15/2020   PLT 313 03/15/2020   Lab Results  Component Value Date   IRON 63 07/28/2020   TIBC 274 07/28/2020   FERRITIN 57 07/28/2020   Attestation Statements:   Reviewed by clinician on day of visit: allergies, medications, problem list, medical history, surgical history, family history, social history, and previous encounter notes.  Time spent on visit including pre-visit chart review and post-visit care and charting was 28 minutes.   I, Water quality scientist, CMA, am acting as Location manager for Mina Marble, NP.  I have reviewed the above documentation for accuracy and completeness, and I agree with the above. -  Dezmin Kittelson d. Haidee Stogsdill, NP-C

## 2020-11-10 ENCOUNTER — Other Ambulatory Visit (INDEPENDENT_AMBULATORY_CARE_PROVIDER_SITE_OTHER): Payer: Self-pay | Admitting: Adult Health

## 2020-11-10 ENCOUNTER — Other Ambulatory Visit (INDEPENDENT_AMBULATORY_CARE_PROVIDER_SITE_OTHER): Payer: Self-pay | Admitting: Family Medicine

## 2020-11-10 DIAGNOSIS — J302 Other seasonal allergic rhinitis: Secondary | ICD-10-CM

## 2020-11-10 DIAGNOSIS — R7303 Prediabetes: Secondary | ICD-10-CM

## 2020-11-10 NOTE — Telephone Encounter (Signed)
LAST APPOINTMENT DATE: 10/20/20 NEXT APPOINTMENT DATE: 11/14/20   [id number] [place], [place] - [address] AT [facility] [address] [place] [address] Phone: [phone] Fax: [phone]  [facility] (New Address) - [place], [place] AT Previously: [address], [address] [place] [address] Phone: [phone] Fax: [phone]  Patient is requesting a refill of the following medications: Pending Prescriptions:                       Disp   Refills   metFORMIN (GLUCOPHAGE) 500 MG tablet [Phar*60 tab*0       Sig: TAKE 1 TABLET(500 MG) BY MOUTH TWICE DAILY WITH A          MEAL   montelukast (SINGULAIR) 10 MG tablet [Phar*30 tab*0       Sig: TAKE 1 TABLET(10 MG) BY MOUTH AT BEDTIME   Date last filled: metformin- 09/13/20 Dr.Opalski Previously prescribed by Montelukast- 09/27/20 Dr.Opalski  Lab Results      Component                Value               Date                      HGBA1C                   6.4 (H)             07/28/2020                HGBA1C                   6.4 (H)             03/15/2020           Lab Results      Component                Value               Date                      LDLCALC                  111 (H)             07/28/2020                CREATININE               0.85                07/28/2020           Lab Results      Component                Value               Date                      VD25OH                   68.5                07/28/2020  VD25OH                   22.9 (L)            03/15/2020            BP Readings from Last 3 Encounters: 10/20/20 : 117/74 09/27/20 : 115/78 09/13/20 : 117/60

## 2020-11-12 ENCOUNTER — Other Ambulatory Visit (INDEPENDENT_AMBULATORY_CARE_PROVIDER_SITE_OTHER): Payer: Self-pay | Admitting: Adult Health

## 2020-11-12 DIAGNOSIS — J302 Other seasonal allergic rhinitis: Secondary | ICD-10-CM

## 2020-11-14 ENCOUNTER — Encounter (INDEPENDENT_AMBULATORY_CARE_PROVIDER_SITE_OTHER): Payer: Self-pay | Admitting: Family Medicine

## 2020-11-14 ENCOUNTER — Ambulatory Visit (INDEPENDENT_AMBULATORY_CARE_PROVIDER_SITE_OTHER): Payer: BC Managed Care – PPO | Admitting: Family Medicine

## 2020-11-14 ENCOUNTER — Other Ambulatory Visit: Payer: Self-pay

## 2020-11-14 VITALS — BP 115/71 | HR 68 | Temp 98.2°F | Ht 65.0 in | Wt 291.0 lb

## 2020-11-14 DIAGNOSIS — E538 Deficiency of other specified B group vitamins: Secondary | ICD-10-CM | POA: Diagnosis not present

## 2020-11-14 DIAGNOSIS — R7303 Prediabetes: Secondary | ICD-10-CM

## 2020-11-14 DIAGNOSIS — E7849 Other hyperlipidemia: Secondary | ICD-10-CM | POA: Diagnosis not present

## 2020-11-14 DIAGNOSIS — E559 Vitamin D deficiency, unspecified: Secondary | ICD-10-CM | POA: Diagnosis not present

## 2020-11-14 DIAGNOSIS — Z9189 Other specified personal risk factors, not elsewhere classified: Secondary | ICD-10-CM

## 2020-11-14 DIAGNOSIS — Z6841 Body Mass Index (BMI) 40.0 and over, adult: Secondary | ICD-10-CM

## 2020-11-14 MED ORDER — TIRZEPATIDE 2.5 MG/0.5ML ~~LOC~~ SOAJ: 2.5000 mg | SUBCUTANEOUS | 0 refills | Status: DC

## 2020-11-14 NOTE — Progress Notes (Signed)
Chief Complaint:   OBESITY [name] is here to discuss her progress with her obesity treatment plan along with follow-up of her obesity related diagnoses. [name] is on keeping a food journal and adhering to recommended goals of 1500-1700 calories and 115 grams of protein and Category 4 and states she is following her eating plan approximately 10% of the time. [name] states she is doing 0 minutes 0 times per week.  Today's visit was #: 13 Starting weight: 333 lbs Starting date: 03/15/2020 Today's weight: 291 lbs Today's date: 11/14/2020 Total lbs lost to date: 42 lbs Total lbs lost since last in-office visit: 0  Interim History: [name] notes grazing throughout day. She feels she nearly "gave up" over the past few weeks. She has been off plan, skipping meals, eating sweets. She has been craving sweets which is usually not an issue for her. She was almost ready to have bariatric surgery.  Subjective:   1. Prediabetes [name] often forgets and does not take her Metformin because she forgets or her food choices have been poor and she is worried about loose stools with the metformin. She notes cravings and hunger. She has no history of cholelithiasis, pancreatitis or personal family history of thyroid cancer.   Lab Results  Component Value Date   HGBA1C 6.4 (H) 07/28/2020   Lab Results  Component Value Date   INSULIN 15.0 07/28/2020   INSULIN 30.0 (H) 03/15/2020    2. Other hyperlipidemia [name]'s last LDL was elevated at 111. Her HDL and Triglycerides were within normal limits. She is not on statin currently.  Lab Results  Component Value Date   CHOL 179 07/28/2020   HDL 51 07/28/2020   LDLCALC 111 (H) 07/28/2020   TRIG 90 07/28/2020   CHOLHDL 3.5 07/28/2020   Lab Results  Component Value Date   ALT 11 07/28/2020   AST 14 07/28/2020   ALKPHOS 84 07/28/2020   BILITOT 0.4 07/28/2020   The 10-year ASCVD risk score (Arnett DK, et al., 2019) is: 2.2%   Values used to calculate the  score:     Age: 50 years     Sex: Female     Is Non-Hispanic African American: Yes     Diabetic: No     Tobacco smoker: No     Systolic Blood Pressure: 341 mmHg     Is BP treated: Yes     HDL Cholesterol: 51 mg/dL     Total Cholesterol: 179 mg/dL   3. Vitamin D deficiency [name]'s Vitamin D was at goal (68.5). She is on 5000 OTC Vitamin D3 daily.  Lab Results  Component Value Date   VD25OH 68.5 07/28/2020   VD25OH 22.9 (L) 03/15/2020    4. B12 deficiency [name] last B12 was low at 223. She is on OTC B12 500 mcg daily.  CBC Latest Ref Rng & Units 07/28/2020 03/15/2020 07/10/2007  WBC 3.4 - 10.8 x10E3/uL - 5.8 4.8  Hemoglobin 11.1 - 15.9 g/dL - 12.2 13.1  Hematocrit 34.0 - 46.6 % 39.6 37.5 37.9  Platelets 150 - 450 x10E3/uL - 313 228   Lab Results  Component Value Date   IRON 63 07/28/2020   TIBC 274 07/28/2020   FERRITIN 57 07/28/2020   Lab Results  Component Value Date   VITAMINB12 223 (L) 07/28/2020     5. At risk for side effect of medication [name] is at risk for side effect of medication due to start of Mounjaro.  Assessment/Plan:   1. Prediabetes  [name] will discontinue Metformin. [name] agrees to start Mourjaro 2.5 mg weekly.  - Comprehensive metabolic panel - Insulin, random - Hemoglobin A1c - tirzepatide (MOUNJARO) 2.5 MG/0.5ML Pen; Inject 2.5 mg into the skin once a week.  Dispense: 2 mL; Refill: 0  2. Other hyperlipidemia  We will check FLP today.Orders and follow up as documented in patient record.   - Lipid Panel With LDL/HDL Ratio  3. Vitamin D deficiency  We will check Vitamin D today. [name] agrees to continue to take OTC Vitamin D 3  5,000 UT daily and she will follow-up for routine testing of Vitamin D, at least 2-3 times per year to avoid over-replacement.  - VITAMIN D 25 Hydroxy (Vit-D Deficiency, Fractures)  4. B12 deficiency Orders and follow up as documented in patient record. We will check Vitamin B 12 level today. [name] will continue OTC  Vitamin B 12.  - Vitamin B12  5. At risk for side effect of medication [name] was given approximately 15 minutes of drug side effect counseling today.  We discussed side effect possibility and risk versus benefits. [name] agreed to the medication and will contact this office if these side effects are intolerable.  Repetitive spaced learning was employed today to elicit superior memory formation and behavioral change.   6. Obesity with curren BMI of 48.43 [name] is currently in the action stage of change. As such, her goal is to continue with weight loss efforts. She has agreed to keeping a food journal and adhering to recommended goals of 1500-1700 calories and 115 grams of protein daily.  [name] is not fasting. She will come and have labs drawn one morning in the next 2 weeks.  Exercise goals: No exercise has been prescribed at this time.  Behavioral modification strategies: increasing lean protein intake and decreasing simple carbohydrates.  [name] has agreed to follow-up with our clinic in 3-4 weeks.  Objective:   Blood pressure 115/71, pulse 68, temperature 98.2 F (36.8 C), height 5\' 5"  (1.651 m), weight 291 lb (132 kg), SpO2 97 %. Body mass index is 48.42 kg/m.  General: Cooperative, alert, well developed, in no acute distress. HEENT: Conjunctivae and lids unremarkable. Cardiovascular: Regular rhythm.  Lungs: Normal work of breathing. Neurologic: No focal deficits.   Lab Results  Component Value Date   CREATININE 0.85 07/28/2020   BUN 12 07/28/2020   NA 139 07/28/2020   K 4.0 07/28/2020   CL 101 07/28/2020   CO2 23 07/28/2020   Lab Results  Component Value Date   ALT 11 07/28/2020   AST 14 07/28/2020   ALKPHOS 84 07/28/2020   BILITOT 0.4 07/28/2020   Lab Results  Component Value Date   HGBA1C 6.4 (H) 07/28/2020   HGBA1C 6.4 (H) 03/15/2020   Lab Results  Component Value Date   INSULIN 15.0 07/28/2020   INSULIN 30.0 (H) 03/15/2020   Lab Results  Component  Value Date   TSH 2.170 03/15/2020   Lab Results  Component Value Date   CHOL 179 07/28/2020   HDL 51 07/28/2020   LDLCALC 111 (H) 07/28/2020   TRIG 90 07/28/2020   CHOLHDL 3.5 07/28/2020   Lab Results  Component Value Date   VD25OH 68.5 07/28/2020   VD25OH 22.9 (L) 03/15/2020   Lab Results  Component Value Date   WBC 5.8 03/15/2020   HGB 12.2 03/15/2020   HCT 39.6 07/28/2020   MCV 85 03/15/2020   PLT 313 03/15/2020   Lab Results  Component Value Date  IRON 63 07/28/2020   TIBC 274 07/28/2020   FERRITIN 57 07/28/2020   Attestation Statements:   Reviewed by clinician on day of visit: allergies, medications, problem list, medical history, surgical history, family history, social history, and previous encounter notes.  I, Lizbeth Bark, RMA, am acting as Location manager for [employer], [place].   I have reviewed the above documentation for accuracy and completeness, and I agree with the above. -  Georgianne Fick, FNP

## 2020-11-15 NOTE — Progress Notes (Signed)
Office: [phone]  /  Fax: [phone]    Date: November 29, 2020   Appointment Start Time: 11:02am Duration: 52 minutes Provider: Glennie Isle, Psy.D. Type of Session: Intake for Individual Therapy  Location of Patient: Home (private room) Location of Provider: Provider's home (private office) Type of Contact: Telepsychological Visit via MyChart Video Visit  Informed Consent: Prior to proceeding with today's appointment, two pieces of identifying information were obtained. In addition, Clydia's physical location at the time of this appointment was obtained as well a phone number she could be reached at in the event of technical difficulties. Pamela [name] and this provider participated in today's telepsychological service.   The provider's role was explained to Pamela [name] N [place]. The provider reviewed and discussed issues of confidentiality, privacy, and limits therein (e.g., reporting obligations). In addition to verbal informed consent, written informed consent for psychological services was obtained prior to the initial appointment. Since the clinic is not a 24/7 crisis center, mental health emergency resources were shared and this  provider explained MyChart, e-mail, voicemail, and/or other messaging systems should be utilized only for non-emergency reasons. This provider also explained that information obtained during appointments will be placed in [facility] record and relevant information will be shared with other providers at [facility] for coordination of care. Pamela [name] agreed information may be shared with other [facility] providers as needed for coordination of care and by signing the service agreement document, she provided written consent for coordination of care. Prior to initiating telepsychological services, Pamela [name] completed an informed consent document, which included the development of a safety plan (i.e., an emergency contact and emergency resources) in  the event of an emergency/crisis. Pamela [name] verbally acknowledged understanding she is ultimately responsible for understanding her insurance benefits for telepsychological and in-person services. This provider also reviewed confidentiality, as it relates to telepsychological services, as well as the rationale for telepsychological services (i.e., to reduce exposure risk to COVID-19). Pamela [name]  acknowledged understanding that appointments cannot be recorded without both party consent and she is aware she is responsible for securing confidentiality on her end of the session. Pamela [name] verbally consented to proceed.  Chief Complaint/HPI: Pamela [name] was referred by Jake Bathe, FNP-C on November 14, 2020. The note for the visit indicated, "Pamela [name] notes grazing throughout day. She feels she nearly "gave up" over the past few weeks. She has been off plan, skipping meals, eating sweets. She has been craving sweets which is usually not an issue for her. She was almost ready to have bariatric surgery." The note for the initial appointment with Dr. Briscoe Deutscher on March 15, 2020 indicated the following: "she struggles with family and or coworkers weight loss sabotage, her desired weight loss is 143 pounds, she has been heavy most of her life, she started gaining weight after childbirth, her heaviest weight ever was 333 pounds, she craves savory meals and soul food, she skips breakfast/lunch frequently, she is frequently drinking liquids with calories, she frequently makes poor food choices, she frequently eats larger portions than normal and she struggles with emotional eating." Teola's Food and Mood (modified PHQ-9) score on March 15, 2020 was 16.  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 She described her eating habits as "impulsive" and  "intentional." Pamela [name] believes the onset of emotional eating behaviors was likely in childhood and described the current frequency of  emotional eating behaviors as "few times a month." It was clarified she does not engage in binge eating behaviors, rather she consumes larger portions at times of foods she is craving for meals. Pamela [name] denied a history of restricting food intake, purging and engagement in other compensatory strategies for weight loss, and has never been diagnosed with an eating disorder. She also denied a history of treatment for emotional eating. She is unsure what makes emotional eating behaviors better, adding she would like to "understand" why she is eating when she is not hungry. 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, hospital," but she recalled her father would often say, "If you keep eating, you're going to get fat."  Mental Status Examination:  Appearance: well groomed and appropriate hygiene  Behavior: appropriate to circumstances Mood: euthymic Affect: mood congruent Speech: normal in rate, volume, and tone Eye Contact: appropriate Psychomotor Activity: appropriate  Gait: unable to assess  Thought Process: linear, logical, and goal directed  Thought Content/Perception: denies suicidal and homicidal ideation, plan, and intent, no hallucinations, delusions, bizarre thinking or behavior reported or observed, and denies ideation and engagement in self-injurious behaviors Orientation: time, person, place, and purpose of appointment Memory/Concentration: memory, attention, language, and fund of knowledge intact  Insight/Judgment: fair  Family & Psychosocial History: Pamela [name] reported she is married and she has a son (age 41). She indicated she is currently employed as the Pharmacist, hospital union president in [employer], noting it can be stressful. Additionally, Pamela [name] shared her highest level of education obtained is a Brewing technologist degree in school administration. Currently,  Digna's social support system consists of her husband, sister, father, mother, and God mother. Moreover, Pamela [name] stated she resides with her husband.   Medical History:  Past Medical History:  Diagnosis Date   ADHD    Anxiety    Asthma    Back pain    Constipation    GERD (gastroesophageal reflux disease)    Hypertension    IBS (irritable bowel syndrome)    Joint pain    Lower extremity edema    Obesity    Prediabetes    Past Surgical History:  Procedure Laterality Date   CESAREAN SECTION  1998   OVARIAN CYST REMOVAL     TUBAL LIGATION  2008   Current Outpatient Medications on File Prior to Visit  Medication Sig Dispense Refill   cetirizine (ZYRTEC) 10 MG tablet Take 10 mg by mouth daily.     Cholecalciferol (VITAMIN D3) 125 MCG (5000 UT) TABS 5,000 IU OTC vitamin D3 daily. 90 tablet 3   losartan-hydrochlorothiazide (HYZAAR) 100-12.5 MG tablet 100 tablets.     montelukast (SINGULAIR) 10 MG tablet TAKE 1 TABLET(10 MG) BY MOUTH AT BEDTIME 30 tablet 0   pantoprazole (PROTONIX) 40 MG tablet pantoprazole 40 mg tablet,delayed release  TK 1 T PO D     tirzepatide (MOUNJARO) 2.5 MG/0.5ML Pen Inject 2.5 mg into the skin once a week. 2 mL 0   UNABLE TO FIND Take 65 mg by mouth every other day. Med Name: Nature's Bounty Iron     vitamin B-12 (CYANOCOBALAMIN) 500 MCG tablet 400-500 mcg qd otc supplement     No current facility-administered medications on file prior to visit.  Medication compliant.   Mental Health History: Pamela [name] reported she was diagnosed with ADHD by a psychiatric provider (Dr. Darleene Cleaver) in the last five years, noting she was prescribed Vyvanse. She discontinued Vyvanse in 2018 as she experienced challenges with picking up with her medication. This  provider shared about [facility]. Pamela [name] reported there is no history of hospitalizations for psychiatric concerns. Pamela [name] denied a family history of mental health/substance abuse related concerns. Pamela [name] reported  there is no history of trauma including psychological, physical , and sexual abuse, as well as neglect.   Pamela [name] disclosed a history of a suicidal attempt approximately 8 years ago secondary to marital conflict. She indicated she drank "a lot of alcohol" and "took a lot of pills." She reported she fell asleep and woke up the next morning without any side effects, but realized what she had done ("It was a long-term fix for a temporary problem."). Pamela [name] informed family members and they provided support during that time. She denied experiencing suicidal ideation, plan, and intent since then. 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 If she were to become overwhelmed in the future, which is a sign that a crisis may occur, she identified the following coping skills she could engage in: "disconnect from stimuli (e.g., "grab a book and wrap up on the couch with it");" work on jigsaw puzzles; and listen to loud music. It was recommended the aforementioned be written down and developed into a coping card for future reference; she agreed and was observed writing notes. Psychoeducation regarding the importance of reaching out to a trusted individual and/or utilizing emergency resources if there is a change in emotional status and/or there is an inability to ensure safety was provided. Rhylei's confidence in reaching out to a trusted individual and/or utilizing emergency resources should there be an intensification in emotional status and/or there is an inability to ensure safety was assessed on a scale of one to ten where one is not confident and ten is extremely confident. She reported her confidence is a 10. Additionally, Pamela [name] reported she has "registered, Banker" in  her home as her husband is a retired Engineer, structural. She indicated they are "secure." She was agreeable to relocating firearms/weapons should there ever be any concerns for her safety.  Pamela [name] reported she is in a "healthy place emotionally," noting she is embracing being 50 years old. Aside from concerns noted above and endorsed on the PHQ-9 and GAD-7, Pamela [name] reported experiencing ADHD-related symptoms: difficulty focusing during conversations; challenges with time management; and difficulty paying attention to novel concepts presented in work meetings. Despite experiencing challenges, she indicated they do not significantly impact functioning, noting she engages in coping/compensatory strategies (e.g., using calendars). She also discussed a history of panic attacks, noting she experienced severe panic attacks 10 years ago. Pamela [name] explained she is aware of symptoms and triggers; therefore, she no longer experiences panic attacks. She acknowledged she experiences symptoms of anxiety approximately every other month and she copes by taking hydroxyzine which was previously prescribed by Dr. Darleene Cleaver. Pamela [name] denied current alcohol use, adding she ceased use in February 2022. She denied tobacco use. She denied illicit/recreational substance use. Regarding caffeine intake, Pamela [name] reported consuming 8 oz of black coffee daily. Furthermore, Pamela [name] indicated she is not experiencing the following: hallucinations and delusions, paranoia, symptoms of mania , social withdrawal, crying spells, memory concerns, and obsessions and compulsions. She also denied current suicidal ideation, plan, and intent; history of and current homicidal ideation, plan, and intent; and history of and current engagement in self-harm.  The following strengths were  reported by Pamela [name]: creative; ability to rally people; organization; motivational; nurturing; good listener; good cook; respectful; and accepting/valuing others. The following strengths were  observed by this provider: ability to express thoughts and feelings during the therapeutic session, ability to establish and benefit from a therapeutic relationship, willingness to work toward established goal(s) with the clinic and ability to engage in reciprocal conversation.  Legal History: Pamela [name] reported there is no history of legal involvement.   Structured Assessments Results: The Patient Health Questionnaire-9 (PHQ-9) is a self-report measure that assesses symptoms and severity of depression over the course of the last two weeks. Pamela [name] obtained a score of 2 suggesting minimal depression. Pamela [name] finds the endorsed symptoms to be somewhat difficult. [0= Not at all; 1= Several days; 2= More than half the days; 3= Nearly every day] Little interest or pleasure in doing things 0  Feeling down, depressed, or hopeless 0  Trouble falling or staying asleep, or sleeping too much 1  Feeling tired or having little energy 0  Poor appetite or overeating 0  Feeling bad about yourself --- or that you are a failure or have let yourself or your family down 1  Trouble concentrating on things, such as reading the newspaper or watching television 0  Moving or speaking so slowly that other people could have noticed? Or the opposite --- being so fidgety or restless that you have been moving around a lot more than usual 0  Thoughts that you would be better off dead or hurting yourself in some way 0  PHQ-9 Score 2    The Generalized Anxiety Disorder-7 (GAD-7) is a brief self-report measure that assesses symptoms of anxiety over the course of the last two weeks. Pamela [name] obtained a score of 1 suggesting minimal anxiety. Pamela [name] finds the endorsed symptoms to be somewhat difficult. [0= Not at all; 1= Several days; 2= Over half the days; 3= Nearly every day] Feeling nervous, anxious, on edge 0  Not being able to stop or control worrying 0  Worrying too much about different things 0  Trouble relaxing 1  Being so restless  that it's hard to sit still 0  Becoming easily annoyed or irritable 0  Feeling afraid as if something awful might happen 0  GAD-7 Score 1   Interventions:  Conducted a chart review Focused on rapport building Verbally administered PHQ-9 and GAD-7 for symptom monitoring Provided emphatic reflections and validation Collaborated with patient on a treatment goal  Psychoeducation provided regarding physical versus emotional hunger Conducted a risk assessment and discussed coping card  Provisional DSM-5 Diagnosis(es): F50.89 Other Specified Feeding or Eating Disorder, Emotional Eating Behaviors, F90.9 Unspecified Attention-Deficit/Hyperactivity Disorder, and F41.9 Unspecified Anxiety Disorder  Plan: Pamela [name] appears able and willing to participate as evidenced by collaboration on a treatment goal, engagement in reciprocal conversation, and asking questions as needed for clarification. The next appointment will be scheduled in 2-3 weeks, which will be via MyChart Video Visit. The following treatment goal was established: increase coping skills. This provider will regularly review the treatment plan and medical chart to keep informed of status changes. Pamela [name] expressed understanding and agreement with the initial treatment plan of care. Pamela [name] will be sent a handout via e-mail to utilize between now and the next appointment to increase awareness of hunger patterns and subsequent eating. Pamela [name] provided verbal consent during today's appointment for this provider to send the handout via e-mail.

## 2020-11-29 ENCOUNTER — Telehealth (INDEPENDENT_AMBULATORY_CARE_PROVIDER_SITE_OTHER): Payer: BC Managed Care – PPO | Admitting: Psychology

## 2020-11-29 DIAGNOSIS — F419 Anxiety disorder, unspecified: Secondary | ICD-10-CM | POA: Diagnosis not present

## 2020-11-29 DIAGNOSIS — F909 Attention-deficit hyperactivity disorder, unspecified type: Secondary | ICD-10-CM | POA: Diagnosis not present

## 2020-11-29 DIAGNOSIS — F5089 Other specified eating disorder: Secondary | ICD-10-CM

## 2020-12-05 ENCOUNTER — Other Ambulatory Visit (INDEPENDENT_AMBULATORY_CARE_PROVIDER_SITE_OTHER): Payer: Self-pay | Admitting: Family Medicine

## 2020-12-05 ENCOUNTER — Ambulatory Visit (INDEPENDENT_AMBULATORY_CARE_PROVIDER_SITE_OTHER): Payer: BC Managed Care – PPO | Admitting: Family Medicine

## 2020-12-05 ENCOUNTER — Encounter (INDEPENDENT_AMBULATORY_CARE_PROVIDER_SITE_OTHER): Payer: Self-pay | Admitting: Family Medicine

## 2020-12-05 ENCOUNTER — Other Ambulatory Visit: Payer: Self-pay

## 2020-12-05 VITALS — BP 138/79 | HR 58 | Temp 98.2°F | Ht 65.0 in | Wt 283.0 lb

## 2020-12-05 DIAGNOSIS — Z6841 Body Mass Index (BMI) 40.0 and over, adult: Secondary | ICD-10-CM

## 2020-12-05 DIAGNOSIS — R7303 Prediabetes: Secondary | ICD-10-CM | POA: Diagnosis not present

## 2020-12-05 DIAGNOSIS — K219 Gastro-esophageal reflux disease without esophagitis: Secondary | ICD-10-CM

## 2020-12-05 DIAGNOSIS — Z9189 Other specified personal risk factors, not elsewhere classified: Secondary | ICD-10-CM

## 2020-12-05 MED ORDER — TIRZEPATIDE 5 MG/0.5ML ~~LOC~~ SOAJ: 5.0000 mg | SUBCUTANEOUS | 0 refills | Status: DC

## 2020-12-05 NOTE — Telephone Encounter (Signed)
Dawn 

## 2020-12-05 NOTE — Progress Notes (Unsigned)
  Office: [phone]  /  Fax: [phone]    Date: December 19, 2020   Appointment Start Time: *** Duration: *** minutes Provider: Glennie Isle, Psy.D. Type of Session: Individual Therapy  Location of Patient: {gbptloc:23249} Location of Provider: Provider's Home (private office) Type of Contact: Telepsychological Visit via MyChart Video Visit  Session Content: Pamela [name] is a 50 y.o. female presenting for a follow-up appointment to address the previously established treatment goal of increasing coping skills.Today's appointment was a telepsychological visit due to COVID-19. Pamela [name] provided verbal consent for today's telepsychological appointment and she is aware she is responsible for securing confidentiality on her end of the session. Prior to proceeding with today's appointment, Henretter's physical location at the time of this appointment was obtained as well a phone number she could be reached at in the event of technical difficulties. Pamela [name] and this provider participated in today's telepsychological service.   This provider conducted a brief check-in. *** Pamela [name] was receptive to today's appointment as evidenced by openness to sharing, responsiveness to feedback, and {gbreceptiveness:23401}.  Mental Status Examination:  Appearance: {Appearance:22431} Behavior: {Behavior:22445} Mood: {gbmood:21757} Affect: {Affect:22436} Speech: {Speech:22432} Eye Contact: {Eye Contact:22433} Psychomotor Activity: {Motor Activity:22434} Gait: {gbgait:23404} Thought Process: {thought process:22448}  Thought Content/Perception: {disturbances:22451} Orientation: {Orientation:22437} Memory/Concentration: {gbcognition:22449} Insight/Judgment: {Insight:22446}  Interventions:  {Interventions for Progress Notes:23405}  DSM-5 Diagnosis(es):  F50.89 Other Specified Feeding or Eating Disorder, Emotional Eating Behaviors, F90.9 Unspecified Attention-Deficit/Hyperactivity Disorder , and F41.9 Unspecified Anxiety  Disorder  Treatment Goal & Progress: During the initial appointment with this provider, the following treatment goal was established: increase coping skills. Pamela [name] has demonstrated progress in her goal as evidenced by {gbtxprogress:22839}. Pamela [name] also {gbtxprogress2:22951}.  Plan: The next appointment will be scheduled in {gbweeks:21758}, which will be {gbtxmodality:23402}. The next session will focus on {Plan for Next Appointment:23400}.

## 2020-12-05 NOTE — Progress Notes (Signed)
Chief Complaint:   OBESITY Pamela [name] is here to discuss her progress with her obesity treatment plan along with follow-up of her obesity related diagnoses. Pamela [name] is on keeping a food journal and adhering to recommended goals of 1500-1700 calories and 115 protein and states she is following her eating plan approximately 98% of the time. Pamela [name] states she is walking for 30 minutes 1 times per week.  Today's visit was #: 14 Starting weight: 333 lbs Starting date: 03/15/2020 Today's weight: 283 lbs Today's date: 12/05/2020 Total lbs lost to date: 50 lbs Total lbs lost since last in-office visit: 8 lbs  Interim History: Pamela [name] is very happy with the effects of the [facility]. She says it helps her make better food choices. She is now down 50 lbs overall. She is adhering to the plan very well. She drinks about 36 ounces of water per day and wants to do better with water intake. She denies intake of sugar sweetened beverages.   Subjective:   1. Prediabetes Pamela [name] was started on Heron at last office visit. She denies nausea or changes in bowel patterns. Her appetite is well controlled. Her last A1C was 6.4.  Lab Results  Component Value Date   HGBA1C 6.4 (H) 07/28/2020   Lab Results  Component Value Date   INSULIN 15.0 07/28/2020   INSULIN 30.0 (H) 03/15/2020    2. Gastroesophageal reflux disease, unspecified whether esophagitis present Pamela [name]'s GERD is well controlled on Pantoprazole. She denies increased GERD with start of Mounjaro.   3. At risk for side effect of medication Pamela [name] is at risk for side effect of medication due increased dose of Mounjaro.   Assessment/Plan:   1. Prediabetes Pamela [name] agrees to increase dose of Mounjaro from 2.5 mg to 5.0 weekly with no refills. She will continue to work on weight loss, exercise, and decreasing simple carbohydrates to help decrease the risk of diabetes.   - tirzepatide [facility]) 5 MG/0.5ML Pen; Inject 5 mg into the skin once a week.   Dispense: 6 mL; Refill: 0  2. Gastroesophageal reflux disease, unspecified whether esophagitis present Intensive lifestyle modifications are the first line treatment for this issue. We discussed several lifestyle modifications today and she will continue to work on diet, exercise and weight loss efforts. Pamela [name] will continue Pantoprazole.   3. At risk for side effect of medication Pamela [name] was given approximately 15 minutes of drug side effect counseling today.  We discussed side effect possibility and risk versus benefits. Pamela [name] agreed to the medication and will contact this office if these side effects are intolerable.  Repetitive spaced learning was employed today to elicit superior memory formation and behavioral change.   4. Obesity with current BMI of 47.09 Pamela [name] is currently in the action stage of change. As such, her goal is to continue with weight loss efforts. She has agreed to keeping a food journal and adhering to recommended goals of 1500-1700 calories and 115 grams of protein.   Pamela [name] will be more intentional with water intake. Handout: Holiday strategies was given today.   Exercise goals:  As is.  Behavioral modification strategies: holiday eating strategies  and planning for success.  Pamela [name] has agreed to follow-up with our clinic in 3-4 weeks.  Objective:   Blood pressure 138/79, pulse (!) 58, temperature 98.2 F (36.8 C), height 5\' 5"  (1.651 m), weight 283 lb (128.4 kg), SpO2 98 %. Body mass index is 47.09 kg/m.  General: Cooperative, alert, well developed, in no acute distress. HEENT: Conjunctivae  and lids unremarkable. Cardiovascular: Regular rhythm.  Lungs: Normal work of breathing. Neurologic: No focal deficits.   Lab Results  Component Value Date   CREATININE 0.85 07/28/2020   BUN 12 07/28/2020   NA 139 07/28/2020   K 4.0 07/28/2020   CL 101 07/28/2020   CO2 23 07/28/2020   Lab Results  Component Value Date   ALT 11 07/28/2020   AST 14 07/28/2020    ALKPHOS 84 07/28/2020   BILITOT 0.4 07/28/2020   Lab Results  Component Value Date   HGBA1C 6.4 (H) 07/28/2020   HGBA1C 6.4 (H) 03/15/2020   Lab Results  Component Value Date   INSULIN 15.0 07/28/2020   INSULIN 30.0 (H) 03/15/2020   Lab Results  Component Value Date   TSH 2.170 03/15/2020   Lab Results  Component Value Date   CHOL 179 07/28/2020   HDL 51 07/28/2020   LDLCALC 111 (H) 07/28/2020   TRIG 90 07/28/2020   CHOLHDL 3.5 07/28/2020   Lab Results  Component Value Date   VD25OH 68.5 07/28/2020   VD25OH 22.9 (L) 03/15/2020   Lab Results  Component Value Date   WBC 5.8 03/15/2020   HGB 12.2 03/15/2020   HCT 39.6 07/28/2020   MCV 85 03/15/2020   PLT 313 03/15/2020   Lab Results  Component Value Date   IRON 63 07/28/2020   TIBC 274 07/28/2020   FERRITIN 57 07/28/2020   Attestation Statements:   Reviewed by clinician on day of visit: allergies, medications, problem list, medical history, surgical history, family history, social history, and previous encounter notes.  I, Lizbeth Bark, RMA, am acting as Location manager for [employer], [place].   I have reviewed the above documentation for accuracy and completeness, and I agree with the above. -  Georgianne Fick, FNP

## 2020-12-09 ENCOUNTER — Encounter (INDEPENDENT_AMBULATORY_CARE_PROVIDER_SITE_OTHER): Payer: Self-pay | Admitting: Family Medicine

## 2020-12-12 NOTE — Telephone Encounter (Signed)
Dawn 

## 2020-12-16 ENCOUNTER — Other Ambulatory Visit (INDEPENDENT_AMBULATORY_CARE_PROVIDER_SITE_OTHER): Payer: Self-pay | Admitting: Adult Health

## 2020-12-16 DIAGNOSIS — R7303 Prediabetes: Secondary | ICD-10-CM

## 2020-12-18 ENCOUNTER — Encounter (INDEPENDENT_AMBULATORY_CARE_PROVIDER_SITE_OTHER): Payer: Self-pay

## 2020-12-19 ENCOUNTER — Telehealth (INDEPENDENT_AMBULATORY_CARE_PROVIDER_SITE_OTHER): Payer: BC Managed Care – PPO | Admitting: Psychology

## 2020-12-19 NOTE — Telephone Encounter (Signed)
Pamela [name] 

## 2020-12-27 ENCOUNTER — Other Ambulatory Visit: Payer: Self-pay

## 2020-12-27 ENCOUNTER — Encounter (INDEPENDENT_AMBULATORY_CARE_PROVIDER_SITE_OTHER): Payer: Self-pay | Admitting: Family Medicine

## 2020-12-27 ENCOUNTER — Ambulatory Visit (INDEPENDENT_AMBULATORY_CARE_PROVIDER_SITE_OTHER): Payer: BC Managed Care – PPO | Admitting: Family Medicine

## 2020-12-27 VITALS — BP 113/79 | HR 66 | Temp 98.2°F | Ht 65.0 in | Wt 281.0 lb

## 2020-12-27 DIAGNOSIS — Z6841 Body Mass Index (BMI) 40.0 and over, adult: Secondary | ICD-10-CM | POA: Diagnosis not present

## 2020-12-27 DIAGNOSIS — K219 Gastro-esophageal reflux disease without esophagitis: Secondary | ICD-10-CM | POA: Diagnosis not present

## 2020-12-27 DIAGNOSIS — R7303 Prediabetes: Secondary | ICD-10-CM | POA: Diagnosis not present

## 2020-12-27 MED ORDER — TIRZEPATIDE 5 MG/0.5ML ~~LOC~~ SOAJ: 5.0000 mg | SUBCUTANEOUS | 0 refills | Status: DC

## 2021-01-02 ENCOUNTER — Encounter (INDEPENDENT_AMBULATORY_CARE_PROVIDER_SITE_OTHER): Payer: Self-pay | Admitting: Family Medicine

## 2021-01-02 NOTE — Progress Notes (Signed)
Chief Complaint:   OBESITY Pamela [name] is here to discuss her progress with her obesity treatment plan along with follow-up of her obesity related diagnoses. Pamela [name] is on keeping a food journal and adhering to recommended goals of 1500-1700 calories and 115 grams of protein daily and states she is following her eating plan approximately 95% of the time. Pamela [name] states she is walking for 30-60 minutes 2 times per week.  Today's visit was #: 15 Starting weight: 333 lbs Starting date: 03/15/2020 Today's weight: 281 lbs Today's date: 12/27/2020 Total lbs lost to date: 52 Total lbs lost since last in-office visit: 2  Interim History: Pamela [name] vacationed in the past few weeks but she still lost 2 lbs. She notes good appetite suppression with Mounjaro. She journals inconsistently, but her water intake is adequate. She is doing a great job overall and she is down 52 lbs.  She says she may be having a hysterectomy for fibroids soon.  Subjective:   1. Pre-diabetes Pamela [name] is on Mounjaro 5 mg. Her GERD is managed well with Protonix. She had an incident of low blood sugar after going for 16-20 hours without eating.  Lab Results  Component Value Date   HGBA1C 6.4 (H) 07/28/2020   Lab Results  Component Value Date   INSULIN 15.0 07/28/2020   INSULIN 30.0 (H) 03/15/2020   2. Gastroesophageal reflux disease, unspecified whether esophagitis present Pamela [name]'s GERD symptoms are well controlled with Protonix, plus OTC Pepcid as needed. She only needs Pepcid 1-2 times per week.  Assessment/Plan:   1. Pre-diabetes  We will refill Mounjaro 5 mg q weekly for 1 month.  - tirzepatide [facility]) 5 MG/0.5ML Pen; Inject 5 mg into the skin once a week.  Dispense: 6 mL; Refill: 0  2. Gastroesophageal reflux disease, unspecified whether esophagitis present  Pamela [name] will continue Protonix daily and Pepcid as needed. She will continue to work on diet, exercise and weight loss efforts.   3. Obesity with current BMI of  46.76 Pamela [name] is currently in the action stage of change. As such, her goal is to continue with weight loss efforts. She has agreed to keeping a food journal and adhering to recommended goals of 1500-1700 calories and 100 grams of protein daily.   Exercise goals: We discussed adding resistance 2 times per week.  Behavioral modification strategies: increasing lean protein intake and holiday eating strategies .  Pamela [name] has agreed to follow-up with our clinic in 3 to 4 weeks. Objective:   Blood pressure 113/79, pulse 66, temperature 98.2 F (36.8 C), height 5\' 5"  (1.651 m), weight 281 lb (127.5 kg), SpO2 99 %. Body mass index is 46.76 kg/m.  General: Cooperative, alert, well developed, in no acute distress. HEENT: Conjunctivae and lids unremarkable. Cardiovascular: Regular rhythm.  Lungs: Normal work of breathing. Neurologic: No focal deficits.   Lab Results  Component Value Date   CREATININE 0.85 07/28/2020   BUN 12 07/28/2020   NA 139 07/28/2020   K 4.0 07/28/2020   CL 101 07/28/2020   CO2 23 07/28/2020   Lab Results  Component Value Date   ALT 11 07/28/2020   AST 14 07/28/2020   ALKPHOS 84 07/28/2020   BILITOT 0.4 07/28/2020   Lab Results  Component Value Date   HGBA1C 6.4 (H) 07/28/2020   HGBA1C 6.4 (H) 03/15/2020   Lab Results  Component Value Date   INSULIN 15.0 07/28/2020   INSULIN 30.0 (H) 03/15/2020   Lab Results  Component Value Date   TSH  2.170 03/15/2020   Lab Results  Component Value Date   CHOL 179 07/28/2020   HDL 51 07/28/2020   LDLCALC 111 (H) 07/28/2020   TRIG 90 07/28/2020   CHOLHDL 3.5 07/28/2020   Lab Results  Component Value Date   VD25OH 68.5 07/28/2020   VD25OH 22.9 (L) 03/15/2020   Lab Results  Component Value Date   WBC 5.8 03/15/2020   HGB 12.2 03/15/2020   HCT 39.6 07/28/2020   MCV 85 03/15/2020   PLT 313 03/15/2020   Lab Results  Component Value Date   IRON 63 07/28/2020   TIBC 274 07/28/2020   FERRITIN 57 07/28/2020    Attestation Statements:   Reviewed by clinician on day of visit: allergies, medications, problem list, medical history, surgical history, family history, social history, and previous encounter notes.   Wilhemena Durie, am acting as Location manager for [employer], FNP-C.  I have reviewed the above documentation for accuracy and completeness, and I agree with the above. -  Georgianne Fick, FNP

## 2021-01-05 ENCOUNTER — Encounter (INDEPENDENT_AMBULATORY_CARE_PROVIDER_SITE_OTHER): Payer: Self-pay | Admitting: Family Medicine

## 2021-01-24 ENCOUNTER — Ambulatory Visit (INDEPENDENT_AMBULATORY_CARE_PROVIDER_SITE_OTHER): Payer: BC Managed Care – PPO | Admitting: Family Medicine

## 2021-01-24 ENCOUNTER — Encounter (INDEPENDENT_AMBULATORY_CARE_PROVIDER_SITE_OTHER): Payer: Self-pay | Admitting: Family Medicine

## 2021-01-24 ENCOUNTER — Other Ambulatory Visit: Payer: Self-pay

## 2021-01-24 VITALS — BP 121/76 | HR 63 | Temp 97.8°F | Ht 65.0 in | Wt 268.0 lb

## 2021-01-24 DIAGNOSIS — I10 Essential (primary) hypertension: Secondary | ICD-10-CM | POA: Diagnosis not present

## 2021-01-24 DIAGNOSIS — Z6841 Body Mass Index (BMI) 40.0 and over, adult: Secondary | ICD-10-CM | POA: Diagnosis not present

## 2021-01-24 DIAGNOSIS — R7303 Prediabetes: Secondary | ICD-10-CM

## 2021-01-24 MED ORDER — OZEMPIC (0.25 OR 0.5 MG/DOSE) 2 MG/1.5ML ~~LOC~~ SOPN: 0.5000 mg | PEN_INJECTOR | SUBCUTANEOUS | 0 refills | Status: DC

## 2021-01-24 NOTE — Progress Notes (Signed)
Chief Complaint:   OBESITY Pamela [name] is here to discuss her progress with her obesity treatment plan along with follow-up of her obesity related diagnoses. Pamela [name] is on keeping a food journal and adhering to recommended goals of 1500-1700 calories and 100 grams of protein and states she is following her eating plan approximately 60% of the time. Pamela [name] states she is walking for 60 minutes 2 times per week.  Today's visit was #: 27 Starting weight: 333 lbs Starting date: 03/15/2020 Today's weight: 268 lbs Today's date: 01/24/2021 Total lbs lost to date: 65 lbs Total lbs lost since last in-office visit: 13 lbs  Interim History: Pamela [name] has done very well with weight loss and is down 65 lbs. She feels she now knows what to eat. She has not been journaling consistently but she is down 13 lbs since last OV.   Subjective:   1. Pre-diabetes Pamela [name] is on Mounjaro 5 mg and tolerating it well. She notes her food preferences have changed with the [facility]. Her last A1C is elevated at 6.4.  Lab Results  Component Value Date   HGBA1C 6.4 (H) 07/28/2020   Lab Results  Component Value Date   INSULIN 15.0 07/28/2020   INSULIN 30.0 (H) 03/15/2020    2. Essential hypertension Pamela [name]'s blood pressure is well controlled on Losartan/HCTZ 100-12.5 mg daily.  BP Readings from Last 3 Encounters:  01/24/21 121/76  12/27/20 113/79  12/05/20 138/79    Assessment/Plan:   1. Pre-diabetes Pamela [name] will discontinue Mounjaro due to availability issues. . She agrees to start Ozempic 0.5 mg weekly.  - Semaglutide,0.25 or 0.5MG /DOS, (OZEMPIC, 0.25 OR 0.5 MG/DOSE,) 2 MG/1.5ML SOPN; Inject 0.5 mg into the skin once a week.  Dispense: 1.5 mL; Refill: 0  2. Essential hypertension Pamela [name] will continue taking Losartan/HCTZ 100-12.5 mg daily.   3. Obesity: Current BMI 44.6 Pamela [name] is currently in the action stage of change. As such, her goal is to continue with weight loss efforts. She has agreed to keeping a food  journal and adhering to recommended goals of 1500-1700 calories and 100 grams of protein.   Pamela [name] will journal 5 days per week.  Exercise goals:  As is.  Behavioral modification strategies: keeping a strict food journal.  Pamela [name] has agreed to follow-up with our clinic in 4 weeks.  Objective:   Blood pressure 121/76, pulse 63, temperature 97.8 F (36.6 C), height 5\' 5"  (1.651 m), weight 268 lb (121.6 kg), SpO2 100 %. Body mass index is 44.6 kg/m.  General: Cooperative, alert, well developed, in no acute distress. HEENT: Conjunctivae and lids unremarkable. Cardiovascular: Regular rhythm.  Lungs: Normal work of breathing. Neurologic: No focal deficits.   Lab Results  Component Value Date   CREATININE 0.85 07/28/2020   BUN 12 07/28/2020   NA 139 07/28/2020   K 4.0 07/28/2020   CL 101 07/28/2020   CO2 23 07/28/2020   Lab Results  Component Value Date   ALT 11 07/28/2020   AST 14 07/28/2020   ALKPHOS 84 07/28/2020   BILITOT 0.4 07/28/2020   Lab Results  Component Value Date   HGBA1C 6.4 (H) 07/28/2020   HGBA1C 6.4 (H) 03/15/2020   Lab Results  Component Value Date   INSULIN 15.0 07/28/2020   INSULIN 30.0 (H) 03/15/2020   Lab Results  Component Value Date   TSH 2.170 03/15/2020   Lab Results  Component Value Date   CHOL 179 07/28/2020   HDL 51 07/28/2020   LDLCALC 111 (H) 07/28/2020  TRIG 90 07/28/2020   CHOLHDL 3.5 07/28/2020   Lab Results  Component Value Date   VD25OH 68.5 07/28/2020   VD25OH 22.9 (L) 03/15/2020   Lab Results  Component Value Date   WBC 5.8 03/15/2020   HGB 12.2 03/15/2020   HCT 39.6 07/28/2020   MCV 85 03/15/2020   PLT 313 03/15/2020   Lab Results  Component Value Date   IRON 63 07/28/2020   TIBC 274 07/28/2020   FERRITIN 57 07/28/2020   Attestation Statements:   Reviewed by clinician on day of visit: allergies, medications, problem list, medical history, surgical history, family history, social history, and previous  encounter notes.  I, Lizbeth Bark, RMA, am acting as Location manager for [employer], [place].  I have reviewed the above documentation for accuracy and completeness, and I agree with the above. -  Georgianne Fick, FNP

## 2021-01-24 NOTE — Progress Notes (Signed)
Ozempic

## 2021-02-03 ENCOUNTER — Encounter (INDEPENDENT_AMBULATORY_CARE_PROVIDER_SITE_OTHER): Payer: Self-pay | Admitting: Family Medicine

## 2021-02-03 DIAGNOSIS — R7303 Prediabetes: Secondary | ICD-10-CM

## 2021-02-07 NOTE — Telephone Encounter (Signed)
LOV w/ Dawn

## 2021-02-08 ENCOUNTER — Other Ambulatory Visit (HOSPITAL_COMMUNITY): Payer: Self-pay

## 2021-02-08 MED ORDER — OZEMPIC (0.25 OR 0.5 MG/DOSE) 2 MG/1.5ML ~~LOC~~ SOPN
0.5000 mg | PEN_INJECTOR | SUBCUTANEOUS | 0 refills | Status: DC
  Filled 2021-02-08: qty 1.5, 28d supply, fill #0

## 2021-02-08 NOTE — Telephone Encounter (Signed)
Pt would like prescription switched to [facility] pharmacy.

## 2021-02-09 ENCOUNTER — Other Ambulatory Visit (HOSPITAL_COMMUNITY): Payer: Self-pay

## 2021-02-17 ENCOUNTER — Encounter (INDEPENDENT_AMBULATORY_CARE_PROVIDER_SITE_OTHER): Payer: Self-pay | Admitting: Family Medicine

## 2021-02-21 ENCOUNTER — Ambulatory Visit (INDEPENDENT_AMBULATORY_CARE_PROVIDER_SITE_OTHER): Payer: BC Managed Care – PPO | Admitting: Family Medicine

## 2021-02-22 ENCOUNTER — Telehealth (INDEPENDENT_AMBULATORY_CARE_PROVIDER_SITE_OTHER): Payer: Self-pay

## 2021-02-22 ENCOUNTER — Ambulatory Visit (INDEPENDENT_AMBULATORY_CARE_PROVIDER_SITE_OTHER): Payer: BC Managed Care – PPO | Admitting: Family Medicine

## 2021-02-22 ENCOUNTER — Other Ambulatory Visit (HOSPITAL_COMMUNITY): Payer: Self-pay

## 2021-02-22 ENCOUNTER — Other Ambulatory Visit: Payer: Self-pay

## 2021-02-22 ENCOUNTER — Encounter (INDEPENDENT_AMBULATORY_CARE_PROVIDER_SITE_OTHER): Payer: Self-pay | Admitting: Family Medicine

## 2021-02-22 VITALS — BP 114/73 | HR 59 | Temp 98.4°F | Ht 65.0 in | Wt 270.0 lb

## 2021-02-22 DIAGNOSIS — I10 Essential (primary) hypertension: Secondary | ICD-10-CM

## 2021-02-22 DIAGNOSIS — R7303 Prediabetes: Secondary | ICD-10-CM

## 2021-02-22 DIAGNOSIS — Z6841 Body Mass Index (BMI) 40.0 and over, adult: Secondary | ICD-10-CM | POA: Diagnosis not present

## 2021-02-22 MED ORDER — WEGOVY 1 MG/0.5ML ~~LOC~~ SOAJ
1.0000 mg | SUBCUTANEOUS | 0 refills | Status: DC
  Filled 2021-02-22 (×2): qty 2, 28d supply, fill #0

## 2021-02-22 NOTE — Progress Notes (Signed)
Chief Complaint:   OBESITY Pamela [name] is here to discuss her progress with her obesity treatment plan along with follow-up of her obesity related diagnoses. Pamela [name] is on keeping a food journal and adhering to recommended goals of 1500-1700 calories and 100 grams of protein and states she is following her eating plan approximately 100% of the time. Pamela [name] states she is walking for 30 minutes 3 times per day and using the elliptical for 60 minutes 5 times per week.  Today's visit was #: 30 Starting weight: 333 lbs Starting date: 03/15/2020 Today's weight: 270 lbs Today's date: 02/22/2021 Total lbs lost to date: 63 lbs Total lbs lost since last in-office visit: 0  Interim History: Pamela [name] is noticing some increased snacking recently. Her portions are well controlled with Ozempic. She has done an amazing job with weight loss losing 63 lbs over the past 11 months.  She will have a hysterectomy February 8th for fibroids.  Subjective:   1. Essential hypertension Pamela [name]'s dose of blood pressure medication was decreased by primary care physician due to hypotension. She is very happy about this.  BP Readings from Last 3 Encounters:  02/22/21 114/73  01/24/21 121/76  12/27/20 113/79    2. Pre-diabetes Pamela [name]'s last A1C was 6.4. She has been on Ozempic which is working well for portion control but not as well for hunger/snacking.   Lab Results  Component Value Date   HGBA1C 6.4 (H) 07/28/2020   Lab Results  Component Value Date   INSULIN 15.0 07/28/2020   INSULIN 30.0 (H) 03/15/2020    Assessment/Plan:   1. Essential hypertension Pamela [name] will follow up with primary care physician for hypertension management.   2. Pre-diabetes Pamela [name] will discontinue Ozempic. She agrees to start Wegovy 1 mg weekly. - Semaglutide-Weight Management (WEGOVY) 1 MG/0.5ML SOAJ; Inject 1 mg into the skin once a week.  Dispense: 2 mL; Refill: 0  3. Obesity: Current BMI 44.93 Pamela [name] is currently in the action stage  of change. As such, her goal is to continue with weight loss efforts. She has agreed to keeping a food journal and adhering to recommended goals of 1500-1700 calories and 100 grams of protein daily.  Exercise goals:  As is.  Behavioral modification strategies: planning for success.  Pamela [name] has agreed to follow-up with our clinic in 4 weeks (virtual) because she will be recovering from a hysterectomy on Feb 8. Objective:   Blood pressure 114/73, pulse (!) 59, temperature 98.4 F (36.9 C), height 5\' 5"  (1.651 m), weight 270 lb (122.5 kg), SpO2 100 %. Body mass index is 44.93 kg/m.  General: Cooperative, alert, well developed, in no acute distress. HEENT: Conjunctivae and lids unremarkable. Cardiovascular: Regular rhythm.  Lungs: Normal work of breathing. Neurologic: No focal deficits.   Lab Results  Component Value Date   CREATININE 0.85 07/28/2020   BUN 12 07/28/2020   NA 139 07/28/2020   K 4.0 07/28/2020   CL 101 07/28/2020   CO2 23 07/28/2020   Lab Results  Component Value Date   ALT 11 07/28/2020   AST 14 07/28/2020   ALKPHOS 84 07/28/2020   BILITOT 0.4 07/28/2020   Lab Results  Component Value Date   HGBA1C 6.4 (H) 07/28/2020   HGBA1C 6.4 (H) 03/15/2020   Lab Results  Component Value Date   INSULIN 15.0 07/28/2020   INSULIN 30.0 (H) 03/15/2020   Lab Results  Component Value Date   TSH 2.170 03/15/2020   Lab Results  Component Value Date  CHOL 179 07/28/2020   HDL 51 07/28/2020   LDLCALC 111 (H) 07/28/2020   TRIG 90 07/28/2020   CHOLHDL 3.5 07/28/2020   Lab Results  Component Value Date   VD25OH 68.5 07/28/2020   VD25OH 22.9 (L) 03/15/2020   Lab Results  Component Value Date   WBC 5.8 03/15/2020   HGB 12.2 03/15/2020   HCT 39.6 07/28/2020   MCV 85 03/15/2020   PLT 313 03/15/2020   Lab Results  Component Value Date   IRON 63 07/28/2020   TIBC 274 07/28/2020   FERRITIN 57 07/28/2020   Attestation Statements:   Reviewed by clinician on day  of visit: allergies, medications, problem list, medical history, surgical history, family history, social history, and previous encounter notes.  I, Lizbeth Bark, RMA, am acting as Location manager for [employer], [place].  I have reviewed the above documentation for accuracy and completeness, and I agree with the above. -  Georgianne Fick, FNP

## 2021-02-22 NOTE — Telephone Encounter (Signed)
Your information has been submitted to [employer]. To check for an updated outcome later, reopen this PA request from your dashboard. If [employer] has not responded to your request within 24 hours, contact [employer] at [phone]. If you think there may be a problem with your PA request, use our live chat feature at the bottom right.    

## 2021-02-22 NOTE — Telephone Encounter (Signed)
Prior Josem Kaufmann has been started via cover my meds for [facility]

## 2021-02-22 NOTE — Telephone Encounter (Signed)
Mancel Parsons has been approved 02/22/21 until 09/22/21.

## 2021-02-23 ENCOUNTER — Other Ambulatory Visit (HOSPITAL_COMMUNITY): Payer: Self-pay

## 2021-02-23 ENCOUNTER — Other Ambulatory Visit (INDEPENDENT_AMBULATORY_CARE_PROVIDER_SITE_OTHER): Payer: Self-pay | Admitting: Family Medicine

## 2021-02-23 DIAGNOSIS — J302 Other seasonal allergic rhinitis: Secondary | ICD-10-CM

## 2021-02-23 MED ORDER — MOUNJARO 5 MG/0.5ML ~~LOC~~ SOAJ: 5.0000 mg | SUBCUTANEOUS | 0 refills | Status: DC

## 2021-02-23 MED ORDER — LOSARTAN POTASSIUM-HCTZ 100-25 MG PO TABS
1.0000 | ORAL_TABLET | Freq: Every day | ORAL | 2 refills | Status: DC
  Filled 2021-03-21: qty 90, 90d supply, fill #0

## 2021-02-23 MED ORDER — OZEMPIC (0.25 OR 0.5 MG/DOSE) 2 MG/1.5ML ~~LOC~~ SOPN: 0.5000 mg | PEN_INJECTOR | SUBCUTANEOUS | 0 refills | Status: DC

## 2021-02-23 MED ORDER — MOUNJARO 5 MG/0.5ML ~~LOC~~ SOAJ
5.0000 mg | SUBCUTANEOUS | 0 refills | Status: DC
  Filled 2021-02-23: qty 2, 28d supply, fill #0

## 2021-02-23 MED ORDER — VITAMIN D (ERGOCALCIFEROL) 1.25 MG (50000 UNIT) PO CAPS: 50000.0000 [IU] | ORAL_CAPSULE | ORAL | 0 refills | Status: DC

## 2021-02-23 MED FILL — Montelukast Sodium Tab 10 MG (Base Equiv): ORAL | 30 days supply | Qty: 30 | Fill #0 | Status: AC

## 2021-02-23 NOTE — Telephone Encounter (Signed)
She was in clinic yesterday

## 2021-03-06 ENCOUNTER — Encounter (HOSPITAL_BASED_OUTPATIENT_CLINIC_OR_DEPARTMENT_OTHER): Payer: Self-pay | Admitting: Obstetrics and Gynecology

## 2021-03-06 ENCOUNTER — Other Ambulatory Visit: Payer: Self-pay

## 2021-03-06 NOTE — Progress Notes (Addendum)
PLEASE WEAR A MASK OUT IN PUBLIC AND SOCIAL DISTANCE AND [place] YOUR HANDS FREQUENTLY. PLEASE ASK ALL YOUR CLOSE HOUSEHOLD CONTACT TO WEAR MASK OUT IN PUBLIC AND SOCIAL DISTANCE AND [place] HANDS FREQUENTLY ALSO.      Your procedure is scheduled on Wednesday, 03/15/21.  Report to [facility] AT 9:30 am.   Call this number if you have problems the morning of surgery  [phone].   OUR ADDRESS IS [address].  WE ARE LOCATED IN [facility].  PLEASE BRING YOUR INSURANCE CARD AND PHOTO ID DAY OF SURGERY.  ONLY ONE PERSON ALLOWED IN FACILITY WAITING AREA.                                     REMEMBER:  DO NOT EAT FOOD, CANDY GUM OR MINTS  AFTER MIDNIGHT THE NIGHT BEFORE YOUR SURGERY . YOU MAY HAVE CLEAR LIQUIDS FROM MIDNIGHT THE NIGHT BEFORE YOUR SURGERY UNTIL  8:30 am. NO CLEAR LIQUIDS AFTER   8:30 am DAY OF SURGERY.   YOU MAY  BRUSH YOUR TEETH MORNING OF SURGERY AND RINSE YOUR MOUTH OUT, NO CHEWING GUM CANDY OR MINTS.    CLEAR LIQUID DIET   Foods Allowed                                                                     Foods Excluded  Coffee and tea, regular and decaf                             liquids that you cannot  Plain Jell-O any favor except red or purple                                           see through such as: Fruit ices (not with fruit pulp)                                     milk, soups, orange juice  Iced Popsicles                                    All solid food Carbonated beverages, regular and diet                                    Cranberry, grape and apple juices Sports drinks like Gatorade  Sample Menu Breakfast                                Lunch  Supper Cranberry juice                                           Jell-O                                     Grape juice                           Apple juice Coffee or tea                        Jell-O                                       Popsicle                                                Coffee or tea                        Coffee or tea  _____________________________________________________________________     TAKE THESE MEDICATIONS MORNING OF SURGERY WITH A SIP OF WATER:  Zyrtec & Protonix Do not take Wegovy morning of surgery.  ONE VISITOR IS ALLOWED IN WAITING ROOM ONLY DAY OF SURGERY.  YOU MAY HAVE ANOTHER PERSON SWITCH OUT WITH THE  1  VISITOR IN THE WAITING ROOM DAY OF SURGERY AND A MASK MUST BE WORN IN THE WAITING ROOM.    2 VISITORS  MAY VISIT IN THE EXTENDED RECOVERY ROOM UNTIL 800 PM ONLY 1 VISITOR AGE 25 AND OVER MAY SPEND THE NIGHT AND MUST BE IN EXTENDED RECOVERY ROOM NO LATER THAN 800 PM .    UP TO 2 CHILDREN AGE 76 TO 15 MAY ALSO VISIT IN EXTENDED RECOV ERY ROOM ONLY UNTIL 800 PM AND MUST LEAVE BY 800 PM.   ALL PERSONS VISITING IN EXTENDED RECOVERY ROOM MUST WEAR A MASK.                                    DO NOT WEAR JEWERLY, MAKE UP. DO NOT WEAR LOTIONS, POWDERS, PERFUMES OR NAIL POLISH ON YOUR FINGERNAILS. TOENAIL POLISH IS OK TO WEAR. DO NOT SHAVE FOR 48 HOURS PRIOR TO DAY OF SURGERY. MEN MAY SHAVE FACE AND NECK. CONTACTS, GLASSES, OR DENTURES MAY NOT BE WORN TO SURGERY.                                    [facility] IS NOT RESPONSIBLE  FOR ANY BELONGINGS.                                                                    [facility]  [facility] - Preparing for Surgery Before surgery, you can play an important role.  Because skin is not sterile, your skin needs to be as free of germs as possible.  You can reduce the number of germs on your skin by washing with CHG (chlorahexidine gluconate) soap before surgery.  CHG is an antiseptic cleaner which kills germs and bonds with the skin to continue killing germs even after washing. Please DO NOT use if you have an allergy to CHG or antibacterial soaps.  If your skin becomes reddened/irritated stop using the CHG and inform your nurse when you  arrive at Short Stay. Do not shave (including legs and underarms) for at least 48 hours prior to the first CHG shower.  You may shave your face/neck. Please follow these instructions carefully:  1.  Shower with CHG Soap the night before surgery and the  morning of Surgery.  2.  If you choose to wash your hair, wash your hair first as usual with your  normal  shampoo.  3.  After you shampoo, rinse your hair and body thoroughly to remove the  shampoo.                            4.  Use CHG as you would any other liquid soap.  You can apply chg directly  to the skin and wash                      Gently with a scrungie or clean washcloth.  5.  Apply the CHG Soap to your body ONLY FROM THE NECK DOWN.   Do not use on face/ open                           Wound or open sores. Avoid contact with eyes, ears mouth and genitals (private parts).                       Wash face,  Genitals (private parts) with your normal soap.             6.  Wash thoroughly, paying special attention to the area where your surgery  will be performed.  7.  Thoroughly rinse your body with warm water from the neck down.  8.  DO NOT shower/wash with your normal soap after using and rinsing off  the CHG Soap.                9.  Pat yourself dry with a clean towel.            10.  Wear clean pajamas.            11.  Place clean sheets on your bed the night of your first shower and do not  sleep with pets. Day of Surgery : Do not apply any lotions/deodorants the morning of surgery.  Please wear clean clothes to the hospital/surgery center.  IF YOU HAVE ANY SKIN IRRITATION OR PROBLEMS WITH THE SURGICAL SOAP, PLEASE GET A BAR OF GOLD DIAL SOAP AND SHOWER THE NIGHT BEFORE YOUR SURGERY AND THE MORNING OF YOUR SURGERY. PLEASE LET THE NURSE KNOW MORNING OF YOUR SURGERY IF YOU HAD ANY PROBLEMS WITH THE SURGICAL SOAP.   ________________________________________________________________________  QUESTIONS CALL Kamyia Thomason PRE OP NURSE PHONE [phone].

## 2021-03-06 NOTE — Progress Notes (Addendum)
Spoke w/ via phone for pre-op interview---pt Lab needs dos----urine pregnancy POCT per anesthesia, surgeon orders pending as of 03/06/21               Lab results------03/13/21 lab appt for CBC, t&s, BMP, 03/15/20 EKG in chart & Epic COVID test -----patient states asymptomatic no test needed Arrive at -------0930 on 03/15/21 NPO after MN NO Solid Food.  Clear liquids from MN until---0830 Med rec completed Medications to take morning of surgery -----Zyrtec, Protonix Diabetic medication -----Hold Wegovy morning of surgery. Patient instructed no nail polish to be worn day of surgery Patient instructed to bring photo id and insurance card day of surgery Patient aware to have Driver (ride ) / caregiver    for 24 hours after surgery - husband JD Patient Special Instructions -----Extended recovery instructions given. Bring CPAP. Pre-Op special Istructions -----Requested orders from Dr. Delora Fuel via Epic IB on 03/06/21. Patient verbalized understanding of instructions that were given at this phone interview. Patient denies shortness of breath, chest pain, fever, cough at this phone interview.

## 2021-03-07 NOTE — H&P (Signed)
Pamela [name] is an 51 y.o. G3P1 s/p BTL who is admitted for Robotic Assisted Total Laparoscopic Hysterectomy with Bilateral Salpingectomy for abnormal uterine bleeding.  Patient was first seen at [facility] on 11/10/2020 and reported issues with heavy but normal periods. States she has excessive spotting prior to menses. No pelvic pain but does report low back pain with menses. No known history of fibroids. Her periods are unpredictable and difficult to manage. She has to alter her activities based on the heaviness of flow. She desires definitive surgical management and declines medical management. Her mother went through similar issues and had postmenopausal bleeding after menopause - patient does not desire to undergo a similar experience. She desires ovarian preservation unless one or both ovaries appear abnormal at the time of surgery.  Surgical history significant for CS, bilateral tubal ligation, and removal of ovarian dermoid cyst (ex-lap).  Work-up: Pap: NILM/HRHPV negative (11/10/20) EMB (11/30/20): No endometrial tissue  EMB (12/15/20): Secretory pattern endometrium with glandular and stromal breakdown, no malignancy identified  TVUS (01/23/21): Uterus 10.56 x 7.13 x 7.54cm Endometrial thickness 0.61cm Fibroid 1: 2.75cm   Fibroid 2: 6.30cm Right ovary 2.23cm   Left ovary 2.78cm Anteverted uterus - anterior submucosal 2.8 x 2.3 x 2.2cm, posterior 6.3 x 6.1 x 5.0cm; fibroids slightly decreased in size from previous US on 11/22/20. Endometrium slightly irregular in appearance due to submucosal fibroid pushing on endometrial cavity. Nabothian cysts of the cervix. Bilateral ovaries wnl. Previous right ovarian cyst resolved. NO adnexal masses seen.  TVUS (11/22/20): Uterus 11.26 x 6.88 x 7.81cm Endometrial thickness 0.78cm Fibroid 1: 3.03cm   Fibroid 2: 6.39cm Right ovary: 4.27cm   Left ovary 2.51cm Anteverted uterus. Uterine fibroids - anterior submucosal 3.0 x 2.9 x 2.7cm, posterior  6.4 x 6.2 x 5.5cm. Endometrium thickened. Nabothian cysts present. Right ovary - complex cyst with low-level internal echoes 1.6 x 1.5 x 1.2cm, avascular. Left ovary wnl. No adnexal masses seen.  Patient Active Problem List   Diagnosis Date Noted   Epistaxis 03/24/2021    Other Specified Feeding or Eating Disorder, Emotional Eating Behaviors 03/23/2021   B12 deficiency 11/14/2020   Vitamin D deficiency 07/28/2020   Essential hypertension 07/28/2020   Mixed hyperlipidemia 07/28/2020   Low ferritin 07/28/2020   Prediabetes 07/17/2020   Class 3 severe obesity with serious comorbidity and body mass index (BMI) of 50.0 to 59.9 in adult ([place]) 07/07/2020   Depressed mood, with emotional eating 05/04/2020   Family history of colon cancer 04/13/2020   Screening for malignant neoplasm of colon 04/13/2020   Anal fissure 04/13/2020   Change in bowel habit 04/13/2020   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   Chronic idiopathic constipation 04/13/2020   Constipation 04/13/2020   Irritable bowel syndrome with constipation 04/13/2020   Dysuria 04/13/2020   Family history of malignant neoplasm of gastrointestinal tract 04/13/2020   Generalized abdominal pain 04/13/2020   History of colonic polyps 04/13/2020   Morbid obesity ([place]) 04/13/2020   Rectal bleeding 04/13/2020   Urinary tract infectious disease 04/13/2020   Gastro-esophageal reflux disease without esophagitis 04/13/2020   Gastroesophageal reflux disease 03/25/2020   Perennial allergic rhinitis 08/23/2016   Mild persistent asthma, uncomplicated 95/28/4132    MEDICAL/FAMILY/SOCIAL HX: Patient's last menstrual period was 02/16/2021 (exact date).    Past Medical History:  Diagnosis Date   ADHD    Anxiety    Asthma    Back pain    Constipation    GERD (gastroesophageal reflux disease)    Hypertension  IBS (irritable bowel syndrome)    Joint pain    Lower extremity edema    Obesity    Prediabetes    07/28/20 A1C 6.4/ Patient is  taking Wegovy.   Sleep apnea    05/03/20 sleep study = moderate to severe sleep apnea. Wears CPAP.   Wears glasses     Past Surgical History:  Procedure Laterality Date   CESAREAN SECTION  1998   COLONOSCOPY  08/19/2020   OVARIAN CYST REMOVAL  1991   TUBAL LIGATION  2008   WISDOM TOOTH EXTRACTION  1986    Family History  Problem Relation Age of Onset   Asthma Other    Heart failure Other    Hypertension Other    Hypertension Mother    Hypertension Father    Heart disease Father    Alcoholism Father     Social History:  reports that she has never smoked. She has never used smokeless tobacco. She reports that she does not currently use alcohol. She reports that she does not use drugs.  ALLERGIES/MEDS:  Allergies:  Allergies  Allergen Reactions   Molds & Smuts    Morphine And Related Nausea Only   Latex Rash    Pt states she is not sure if she is allergic to latex or adhesives. Bandaids bother her if they are left on too long.   Wound Dressing Adhesive Rash    No medications prior to admission.     Review of Systems  Constitutional: Negative.   HENT: Negative.    Eyes: Negative.   Respiratory: Negative.    Cardiovascular: Negative.   Gastrointestinal: Negative.   Musculoskeletal: Negative.   Skin: Negative.   Neurological: Negative.   Endo/Heme/Allergies: Negative.   Psychiatric/Behavioral: Negative.     Height 5\' 5"  (1.651 m), weight 122.5 kg, last menstrual period 02/16/2021. Gen:  NAD, pleasant and cooperative Cardio:  RRR Pulm:  CTAB, no wheezes/rales/rhonchi Abd:  Soft, non-distended, non-tender throughout, no rebound/guarding Ext:  No bilateral LE edema, no bilateral calf tenderness  No results found for this or any previous visit (from the past 24 hour(s)).  No results found.   ASSESSMENT/PLAN: Pamela [name] is a 51 y.o. G3P1 s/p BTL who is admitted for Robotic Assisted Total Laparoscopic Hysterectomy with Bilateral Salpingectomy for  abnormal uterine bleeding.  - Admit to [facility] - Admit labs (CBC, T&S, COVID screen) - Diet:  NPO - IVF:  Per anethesia - VTE Prophylaxis:  SCDs - Antibiotics: Ancef 2g on call to OR - D/C home POD#1  Consents: I have explained to the patient that his surgery is performed to remove the uterus through several small incisions in the abdomen and that it will result in sterility.  I discussed the risks and benefits of the surgery, including, but not limited to bleeding, including the need for blood transfusion, infection, damage to surround organs and tissues, damage to bladder, damage to ureters, causing kidney damage, and requiring additional procedures, damage to bowels, resulting in further surgery, postoperative pain, short-term and long-term, scarring on the abdominal wall and intra-abdominally, need for further surgery, need for conversion to an open procedure, development of an incisional hernia, deep vein thrombosis, and/or pulmonary embolism, wound infection and/or separation, painful intercourse, urinary leakage, ovarian failure, resulting in menopausal symptoms requiring treatment, fistula formation, complications the course of which cannot be predicted or prevented, and death. Patient was counseled on prophylactic oophorectomy versus ovarian conservation, understanding that ovarian conservation carries a lifetime risk of ovarian cancer of  1 in 70 and a 5-10% risk of reoperation in the future for ovarian pathology.  She understands that prophylactic oophorectomy may result in menopausal symptoms resulting in the need to hormone replacement therapy following surgery.  The patient opts for ovarian conservation unless ovaries appear abnormal at the time of surgery. Patient was consented for blood products.  The patient is aware that bleeding may result in the need for a blood transfusion which includes risk of transmission of HIV (1:2 million), Hepatitis C (1:2 million), and Hepatitis B (1:200  thousand) and transfusion reaction.  Patient voiced understanding of the above risks as well as understanding of indications for blood transfusion.  Drema Dallas, DO [phone] (office)

## 2021-03-09 ENCOUNTER — Other Ambulatory Visit (HOSPITAL_COMMUNITY): Payer: Self-pay

## 2021-03-09 ENCOUNTER — Other Ambulatory Visit (HOSPITAL_BASED_OUTPATIENT_CLINIC_OR_DEPARTMENT_OTHER): Payer: Self-pay

## 2021-03-09 DIAGNOSIS — U071 COVID-19: Secondary | ICD-10-CM

## 2021-03-09 HISTORY — DX: COVID-19: U07.1

## 2021-03-09 MED ORDER — NIRMATRELVIR&RITONAVIR 300/100 20 X 150 MG & 10 X 100MG PO TBPK
ORAL_TABLET | ORAL | 0 refills | Status: DC
  Filled 2021-03-09: qty 30, 5d supply, fill #0

## 2021-03-09 MED ORDER — PAXLOVID (300/100) 20 X 150 MG & 10 X 100MG PO TBPK: ORAL_TABLET | ORAL | 0 refills | Status: DC

## 2021-03-09 MED ORDER — PAXLOVID (150/100) 10 X 150 MG & 10 X 100MG PO TBPK
ORAL_TABLET | ORAL | 0 refills | Status: DC
  Filled 2021-03-09 (×2): qty 20, 5d supply, fill #0

## 2021-03-13 ENCOUNTER — Encounter (HOSPITAL_COMMUNITY): Admission: RE | Admit: 2021-03-13 | Payer: Self-pay | Source: Ambulatory Visit

## 2021-03-15 DIAGNOSIS — N939 Abnormal uterine and vaginal bleeding, unspecified: Secondary | ICD-10-CM

## 2021-03-15 DIAGNOSIS — Z01818 Encounter for other preprocedural examination: Secondary | ICD-10-CM

## 2021-03-15 DIAGNOSIS — R7303 Prediabetes: Secondary | ICD-10-CM

## 2021-03-21 ENCOUNTER — Other Ambulatory Visit (INDEPENDENT_AMBULATORY_CARE_PROVIDER_SITE_OTHER): Payer: Self-pay

## 2021-03-21 ENCOUNTER — Other Ambulatory Visit (HOSPITAL_COMMUNITY): Payer: Self-pay

## 2021-03-21 ENCOUNTER — Other Ambulatory Visit (INDEPENDENT_AMBULATORY_CARE_PROVIDER_SITE_OTHER): Payer: Self-pay | Admitting: Family Medicine

## 2021-03-21 DIAGNOSIS — J302 Other seasonal allergic rhinitis: Secondary | ICD-10-CM

## 2021-03-22 ENCOUNTER — Other Ambulatory Visit (HOSPITAL_COMMUNITY): Payer: Self-pay

## 2021-03-22 ENCOUNTER — Other Ambulatory Visit: Payer: Self-pay

## 2021-03-22 ENCOUNTER — Telehealth (INDEPENDENT_AMBULATORY_CARE_PROVIDER_SITE_OTHER): Payer: BC Managed Care – PPO | Admitting: Family Medicine

## 2021-03-22 ENCOUNTER — Encounter (INDEPENDENT_AMBULATORY_CARE_PROVIDER_SITE_OTHER): Payer: Self-pay | Admitting: Family Medicine

## 2021-03-22 DIAGNOSIS — Z6841 Body Mass Index (BMI) 40.0 and over, adult: Secondary | ICD-10-CM

## 2021-03-22 DIAGNOSIS — F5089 Other specified eating disorder: Secondary | ICD-10-CM | POA: Diagnosis not present

## 2021-03-22 DIAGNOSIS — E669 Obesity, unspecified: Secondary | ICD-10-CM | POA: Diagnosis not present

## 2021-03-22 DIAGNOSIS — R7303 Prediabetes: Secondary | ICD-10-CM

## 2021-03-22 MED ORDER — WEGOVY 1.7 MG/0.75ML ~~LOC~~ SOAJ
1.7000 mg | SUBCUTANEOUS | 0 refills | Status: DC
Start: 2021-03-22 — End: 2021-04-26
  Filled 2021-03-22: qty 3, 28d supply, fill #0

## 2021-03-22 NOTE — Progress Notes (Signed)
TeleHealth Visit:  Due to the COVID-19 pandemic, this visit was completed with telemedicine (audio/video) technology to reduce patient and provider exposure as well as to preserve personal protective equipment.   Pamela [name] has verbally consented to this TeleHealth visit. The patient is located at home, the provider is located at the [facility] office. The participants in this visit include the listed provider and patient. The visit was conducted today via video.  Chief Complaint: OBESITY Pamela [name] is here to discuss her progress with her obesity treatment plan along with follow-up of her obesity related diagnoses. Pamela [name] is on keeping a food journal and adhering to recommended goals of 1500-1700 calories and 100 grams of protein and states she is following her eating plan approximately 0% of the time. Pamela [name] states she is walking and weights for 60 minutes 4-5 times per week.  Today's visit was #: 18 Starting weight: 333 lbs Starting date: 03/15/2020  Interim History: Pamela [name] will have hysterectomy March 8th. It was cancelled in February because she had COVID. She feels the [facility] has been very helpful for appetite suppression. She is surprised she did not gain weight since the last office visit because she has not adhered to plan much at all.  Subjective:   1.  Other Specified Feeding or Eating Disorder, Emotional Eating Behaviors Pamela [name] tends to snack when she is not hungry. She does use some techniques to avoid this. She is open to seeing Dr. Mallie Mussel our Bariatric Psychologist.   2. Pre-diabetes We changed Pamela [name] to [facility] 1 mg from Ozempic 0.5 mg weekly and tolerating it well. Her appetite is well controlled but she reports increased thoughts of food/snacking.  Lab Results  Component Value Date   HGBA1C 6.4 (H) 07/28/2020   Lab Results  Component Value Date   INSULIN 15.0 07/28/2020   INSULIN 30.0 (H) 03/15/2020    Assessment/Plan:   1.  Other Specified Feeding or Eating  Disorder, Emotional Eating Behaviors We will set Pamela [name] up with Dr. Mallie Mussel after her surgery.  2. Pre-diabetes Pamela [name] agrees to increase dose of Wegovy to 1.7 mg weekly.  - Semaglutide-Weight Management (WEGOVY) 1.7 MG/0.75ML SOAJ; Inject 1.7 mg into the skin once a week.  Dispense: 3 mL; Refill: 0  3. Obesity: Current BMI 44.93 Pamela [name] is currently in the action stage of change. As such, her goal is to continue with weight loss efforts. She has agreed to the Category 4 Plan or keeping a food journal and adhering to recommended goals of 1500-1700 calories and 100 grams of protein daily.   Exercise goals:  As is.  Behavioral modification strategies: emotional eating strategies.  Pamela [name] has agreed to follow-up with our clinic in 6 weeks (virtual).  Objective:   VITALS: Per patient if applicable, see vitals. GENERAL: Alert and in no acute distress. CARDIOPULMONARY: No increased WOB. Speaking in clear sentences.  PSYCH: Pleasant and cooperative. Speech normal rate and rhythm. Affect is appropriate. Insight and judgement are appropriate. Attention is focused, linear, and appropriate.  NEURO: Oriented as arrived to appointment on time with no prompting.   Lab Results  Component Value Date   CREATININE 0.85 07/28/2020   BUN 12 07/28/2020   NA 139 07/28/2020   K 4.0 07/28/2020   CL 101 07/28/2020   CO2 23 07/28/2020   Lab Results  Component Value Date   ALT 11 07/28/2020   AST 14 07/28/2020   ALKPHOS 84 07/28/2020   BILITOT 0.4 07/28/2020   Lab Results  Component Value  Date   HGBA1C 6.4 (H) 07/28/2020   HGBA1C 6.4 (H) 03/15/2020   Lab Results  Component Value Date   INSULIN 15.0 07/28/2020   INSULIN 30.0 (H) 03/15/2020   Lab Results  Component Value Date   TSH 2.170 03/15/2020   Lab Results  Component Value Date   CHOL 179 07/28/2020   HDL 51 07/28/2020   LDLCALC 111 (H) 07/28/2020   TRIG 90 07/28/2020   CHOLHDL 3.5 07/28/2020   Lab Results  Component Value Date    VD25OH 68.5 07/28/2020   VD25OH 22.9 (L) 03/15/2020   Lab Results  Component Value Date   WBC 5.8 03/15/2020   HGB 12.2 03/15/2020   HCT 39.6 07/28/2020   MCV 85 03/15/2020   PLT 313 03/15/2020   Lab Results  Component Value Date   IRON 63 07/28/2020   TIBC 274 07/28/2020   FERRITIN 57 07/28/2020    Attestation Statements:   Reviewed by clinician on day of visit: allergies, medications, problem list, medical history, surgical history, family history, social history, and previous encounter notes.  I, Lizbeth Bark, RMA, am acting as Location manager for [employer], [place].  I have reviewed the above documentation for accuracy and completeness, and I agree with the above. - Georgianne Fick, FNP

## 2021-03-23 DIAGNOSIS — F5089 Other specified eating disorder: Secondary | ICD-10-CM | POA: Insufficient documentation

## 2021-03-24 ENCOUNTER — Other Ambulatory Visit (HOSPITAL_COMMUNITY): Payer: Self-pay

## 2021-03-24 ENCOUNTER — Encounter: Payer: Self-pay | Admitting: Family Medicine

## 2021-03-24 ENCOUNTER — Other Ambulatory Visit: Payer: Self-pay

## 2021-03-24 ENCOUNTER — Ambulatory Visit: Payer: BC Managed Care – PPO | Admitting: Family Medicine

## 2021-03-24 VITALS — BP 148/90 | HR 90 | Temp 97.9°F | Resp 16 | Ht 64.57 in | Wt 269.6 lb

## 2021-03-24 DIAGNOSIS — R040 Epistaxis: Secondary | ICD-10-CM

## 2021-03-24 DIAGNOSIS — J3089 Other allergic rhinitis: Secondary | ICD-10-CM

## 2021-03-24 DIAGNOSIS — J4520 Mild intermittent asthma, uncomplicated: Secondary | ICD-10-CM

## 2021-03-24 DIAGNOSIS — K219 Gastro-esophageal reflux disease without esophagitis: Secondary | ICD-10-CM | POA: Diagnosis not present

## 2021-03-24 MED ORDER — FLUTICASONE PROPIONATE HFA 110 MCG/ACT IN AERO
2.0000 | INHALATION_SPRAY | Freq: Two times a day (BID) | RESPIRATORY_TRACT | 11 refills | Status: DC
  Filled 2021-03-24: qty 1, fill #0
  Filled 2021-03-26: qty 12, 28d supply, fill #0
  Filled 2022-01-09 (×2): qty 12, 30d supply, fill #0
  Filled 2022-01-10: qty 12, fill #0

## 2021-03-24 MED ORDER — FLUTICASONE PROPIONATE 50 MCG/ACT NA SUSP
2.0000 | Freq: Every day | NASAL | 3 refills | Status: DC | PRN
  Filled 2021-03-24: qty 16, 30d supply, fill #0

## 2021-03-24 MED ORDER — ALBUTEROL SULFATE HFA 108 (90 BASE) MCG/ACT IN AERS
2.0000 | INHALATION_SPRAY | Freq: Four times a day (QID) | RESPIRATORY_TRACT | 1 refills | Status: DC | PRN
Start: 2021-03-24 — End: 2021-11-23
  Filled 2021-03-24: qty 18, 25d supply, fill #0
  Filled 2021-07-29: qty 18, 25d supply, fill #1

## 2021-03-24 NOTE — Progress Notes (Signed)
[address]  [place] [address] Dept: [phone]  FOLLOW UP NOTE  Patient ID: Pamela [name], female    DOB: 1970-10-03  Age: 51 y.o. MRN: [id number] Date of Office Visit: 03/24/2021  Assessment  Chief Complaint: Follow-up (Had covid beginning of Feb., in the evenings having tightness in chest, was using albuterol that she has at home but believes that it is out of date. Before COVID wasn't using the inhaler at all.), Medication Refill, Asthma, and Allergic Rhinitis  (No issues)  HPI Pamela [name] is a 51 year old female who presents the clinic for follow-up visit.  She was last seen in this clinic on 03/25/2020 by Gareth Morgan, FNP, for evaluation of asthma, allergic rhinitis, and reflux.  In the interim, she reports that she had COVID for the first time on March 09, 2021 for which she took Paxlovid.  At today's visit, she reports that over the last 2 weeks her asthma has been poorly controlled with symptoms including chest tightness that begins in the evenings and resolves overnight.  She denies shortness of breath with rest and activity, wheezing, and reports of occasional dry cough.  She is not currently using albuterol as this medication has expired.  Allergic rhinitis is reported as moderately well controlled with symptoms of occasional postnasal drainage.  She is not currently using Flonase or nasal saline rinses.  She does report a nosebleed that occurred recently from the right nostril which resolved in under 5 minutes using pressure.  She does report a history of frequent epistaxis that has been well controlled until recently.  Reflux is reported as well controlled with a PPI once a day.  Her current medications are listed in the chart.   Drug Allergies:  Allergies  Allergen Reactions   Molds & Smuts    Morphine And Related Nausea Only   Latex Rash    Pt states she is not sure if she is allergic to latex or adhesives. Bandaids bother her if they are left on too  long.   Wound Dressing Adhesive Rash    Physical Exam: BP (!) 148/90    Pulse 90    Temp 97.9 F (36.6 C) (Temporal)    Resp 16    Ht 5' 4.57" (1.64 m)    Wt 269 lb 9.6 oz (122.3 kg)    SpO2 98%    BMI 45.47 kg/m    Physical Exam Vitals reviewed.  Constitutional:      Appearance: Normal appearance.  HENT:     Head: Normocephalic and atraumatic.     Right Ear: Tympanic membrane normal.     Left Ear: Tympanic membrane normal.     Nose:     Comments: Bilateral nares edematous and pale with clear nasal drainage noted.  Pharynx slightly erythematous with no exudate.  Ears normal.  Eyes normal. Eyes:     Conjunctiva/sclera: Conjunctivae normal.  Cardiovascular:     Rate and Rhythm: Normal rate and regular rhythm.     Heart sounds: Normal heart sounds. No murmur heard. Pulmonary:     Effort: Pulmonary effort is normal.     Breath sounds: Normal breath sounds.     Comments: Lungs clear to auscultation Musculoskeletal:        General: Normal range of motion.     Cervical back: Normal range of motion and neck supple.  Skin:    General: Skin is warm and dry.  Neurological:     Mental Status: She is alert and oriented  to person, place, and time.  Psychiatric:        Mood and Affect: Mood normal.        Behavior: Behavior normal.        Thought Content: Thought content normal.        Judgment: Judgment normal.    Diagnostics: FVC 2.90, FEV1 2.34.  Predicted FVC 2.93, predicted FEV1 2.36.  Spirometry indicates normal ventilatory function.  Assessment and Plan: 1. Mild intermittent asthma without complication   2. Perennial allergic rhinitis   3. Gastroesophageal reflux disease, unspecified whether esophagitis present   4. Epistaxis     Meds ordered this encounter  Medications   albuterol (VENTOLIN HFA) 108 (90 Base) MCG/ACT inhaler    Sig: Inhale 2 puffs into the lungs every 6 (six) hours as needed for wheezing or shortness of breath.    Dispense:  18 g    Refill:  1    fluticasone (FLOVENT HFA) 110 MCG/ACT inhaler    Sig: For asthma flare, inhale 2 puffs into the lungs 2 (two) times daily for 2 weeks or until cough and wheeze free.    Dispense:  12 g    Refill:  11    Please hold. Patient will call when needed. Thank you   fluticasone (FLONASE) 50 MCG/ACT nasal spray    Sig: Place 2 sprays into both nostrils daily as needed for allergies or rhinitis.    Dispense:  16 g    Refill:  3    Patient Instructions  Asthma Continue montelukast 10 mg once a day to prevent cough or wheeze Continue albuterol 2 puffs once every 4 hours as needed for cough or wheeze You may use albuterol 2 puffs 5 to 15 minutes before activity to prevent cough or wheeze For asthma flare, begin Flovent 110-2 puffs twice a day for 2 weeks or until cough and wheeze free. Rinse your mouth after each use of Flovent 110. This medication will be on hold at your pharmacy. Call the pharmacy if you need to fill this medication order.  Allergic rhinitis Continue allergen avoidance measures directed toward mold, cat, and dog as listed below Continue cetirizine 10 mg once a day as needed for runny nose. You may take an additional dose if you have breakthrough symptoms. Remember to rotate to a different antihistamine about every 3 months. Some examples of over the counter antihistamines include Zyrtec (cetirizine), Xyzal (levocetirizine), Allegra (fexofenadine), and Claritin (loratidine).  Continue Flonase 1 spray in each nostril once a day as needed for stuffy nose.  In the right nostril, point the applicator out toward the right ear. In the left nostril, point the applicator out toward the left ear. Stop Flonase if you are experiencing any nasal bleeding Consider saline nasal rinses as needed for nasal symptoms. Use this before any medicated nasal sprays for best result Consider nasal saline gel as needed for dry nostrils  Reflux Continue dietary lifestyle modifications as listed below Continue  pantoprazole 40 mg once a day as previously prescribed  Epistaxis Pinch both nostrils while leaning forward for at least 5 minutes before checking to see if the bleeding has stopped. If bleeding is not controlled within 5-10 minutes apply a cotton ball soaked with oxymetazoline (Afrin) to the bleeding nostril for a few seconds.  If the problem persists or worsens a referral to ENT for further evaluation may be necessary.  Call the clinic if this treatment plan is not working well for you  Follow up in 6  months or sooner if needed.   Return in about 6 months (around 09/21/2021), or if symptoms worsen or fail to improve.    Thank you for the opportunity to care for this patient.  Please do not hesitate to contact me with questions.  Gareth Morgan, FNP [facility]

## 2021-03-24 NOTE — Patient Instructions (Signed)
Asthma Continue montelukast 10 mg once a day to prevent cough or wheeze Continue albuterol 2 puffs once every 4 hours as needed for cough or wheeze You may use albuterol 2 puffs 5 to 15 minutes before activity to prevent cough or wheeze For asthma flare, begin Flovent 110-2 puffs twice a day for 2 weeks or until cough and wheeze free. Rinse your mouth after each use of Flovent 110. This medication will be on hold at your pharmacy. Call the pharmacy if you need to fill this medication order.  Allergic rhinitis Continue allergen avoidance measures directed toward mold, cat, and dog as listed below Continue cetirizine 10 mg once a day as needed for runny nose. You may take an additional dose if you have breakthrough symptoms. Remember to rotate to a different antihistamine about every 3 months. Some examples of over the counter antihistamines include Zyrtec (cetirizine), Xyzal (levocetirizine), Allegra (fexofenadine), and Claritin (loratidine).  Continue Flonase 1 spray in each nostril once a day as needed for stuffy nose.  In the right nostril, point the applicator out toward the right ear. In the left nostril, point the applicator out toward the left ear. Stop Flonase if you are experiencing any nasal bleeding Consider saline nasal rinses as needed for nasal symptoms. Use this before any medicated nasal sprays for best result Consider nasal saline gel as needed for dry nostrils  Reflux Continue dietary lifestyle modifications as listed below Continue pantoprazole 40 mg once a day as previously prescribed  Epistaxis Pinch both nostrils while leaning forward for at least 5 minutes before checking to see if the bleeding has stopped. If bleeding is not controlled within 5-10 minutes apply a cotton ball soaked with oxymetazoline (Afrin) to the bleeding nostril for a few seconds.  If the problem persists or worsens a referral to ENT for further evaluation may be necessary.  Call the clinic if this  treatment plan is not working well for you  Follow up in 6 months or sooner if needed.  Control of Mold Allergen Mold and fungi can grow on a variety of surfaces provided certain temperature and moisture conditions exist.  Outdoor molds grow on plants, decaying vegetation and soil.  The major outdoor mold, Alternaria and Cladosporium, are found in very high numbers during hot and dry conditions.  Generally, a late Summer - Fall peak is seen for common outdoor fungal spores.  Rain will temporarily lower outdoor mold spore count, but counts rise rapidly when the rainy period ends.  The most important indoor molds are Aspergillus and Penicillium.  Dark, humid and poorly ventilated basements are ideal sites for mold growth.  The next most common sites of mold growth are the bathroom and the kitchen.  Outdoor [employer] Use air conditioning and keep windows closed Avoid exposure to decaying vegetation. Avoid leaf raking. Avoid grain handling. Consider wearing a face mask if working in moldy areas.  Indoor Mold Control Maintain humidity below 50%. Clean washable surfaces with 5% bleach solution. Remove sources e.g. Contaminated carpets.  Control of Dog or Cat Allergen Avoidance is the best way to manage a dog or cat allergy. If you have a dog or cat and are allergic to dog or cats, consider removing the dog or cat from the home. If you have a dog or cat but dont want to find it a new home, or if your family wants a pet even though someone in the household is allergic, here are some strategies that may help keep symptoms  at [place]:  Keep the pet out of your bedroom and restrict it to only a few rooms. Be advised that keeping the dog or cat in only one room will not limit the allergens to that room. Dont pet, hug or kiss the dog or cat; if you do, wash your hands with soap and water. High-efficiency particulate air (HEPA) cleaners run continuously in a bedroom or living room can reduce allergen  levels over time. Regular use of a high-efficiency vacuum cleaner or a central vacuum can reduce allergen levels. Giving your dog or cat a bath at least once a week can reduce airborne allergen.   Lifestyle Changes for Controlling GERD When you have GERD, stomach acid feels as if its backing up toward your mouth. Whether or not you take medication to control your GERD, your symptoms can often be improved with lifestyle changes.   Raise Your Head Reflux is more likely to strike when youre lying down flat, because stomach fluid can flow backward more easily. Raising the head of your bed 4-6 inches can help. To do this: Slide blocks or books under the legs at the head of your bed. Or, place a wedge under the mattress. Many foam stores can make a suitable wedge for you. The wedge should run from your waist to the top of your head. Dont just prop your head on several pillows. This increases pressure on your stomach. It can make GERD worse.  Watch Your Eating Habits Certain foods may increase the acid in your stomach or relax the lower esophageal sphincter, making GERD more likely. Its best to avoid the following: Coffee, tea, and carbonated drinks (with and without caffeine) Fatty, fried, or spicy food Mint, chocolate, onions, and tomatoes Any other foods that seem to irritate your stomach or cause you pain  Relieve the Pressure Eat smaller meals, even if you have to eat more often. Dont lie down right after you eat. Wait a few hours for your stomach to empty. Avoid tight belts and tight-fitting clothes. Lose excess weight.  Tobacco and Alcohol Avoid smoking tobacco and drinking alcohol. They can make GERD symptoms worse.

## 2021-03-26 ENCOUNTER — Encounter (HOSPITAL_BASED_OUTPATIENT_CLINIC_OR_DEPARTMENT_OTHER): Payer: Self-pay | Admitting: Obstetrics and Gynecology

## 2021-03-27 ENCOUNTER — Other Ambulatory Visit (HOSPITAL_COMMUNITY): Payer: Self-pay

## 2021-04-03 ENCOUNTER — Other Ambulatory Visit: Payer: Self-pay

## 2021-04-03 ENCOUNTER — Encounter (HOSPITAL_BASED_OUTPATIENT_CLINIC_OR_DEPARTMENT_OTHER): Payer: Self-pay | Admitting: Obstetrics and Gynecology

## 2021-04-03 NOTE — Progress Notes (Addendum)
Spoke w/ via phone for pre-op interview---Pamela [name] needs dos----urine pregnancy POCT               [name] results------04/10/20 [name] appt for CBC, BMP, EKG, type & screen COVID test -----patient states asymptomatic no test needed Arrive at -------0630 on Wednesday, 04/12/21 NPO after MN NO Solid Food.  Clear liquids from MN until---0530 Med rec completed Medications to take morning of surgery -----Zyrtec, Protonix, Flonase prn, Flovent inhaler prn, Ventolin inhaler prn Diabetic medication -----Do not take Wegovy the morning of surgery. Patient instructed no nail polish to be worn day of surgery Patient instructed to bring photo id and insurance card day of surgery Patient aware to have Driver (ride ) / caregiver    for 24 hours after surgery - husband Pamela [name] Patient Special Instructions -----Bring inhalers and CPAP. Pre-Op special Istructions -----Extended recovery instructions given. Patient verbalized understanding of instructions that were given at this phone interview. Patient denies shortness of breath, chest pain, fever, cough at this phone interview.   Patient follows with Pamela [name], [place] [facility] [place]. Her LOV was 03/24/21. Patient had Covid on 03/09/21. Per 03/24/21 OV note, patient reported that her asthma had been poorly controlled over the last two weeks.  As of 04/03/21 pre-op phone call with patient, she is much better, only using her inhaler 1 -2 times daily. She denies SOB, chest pain and cough, claims she is able to climb a flight of stairs with no problems. Patient was instructed to bring inhalers the day of surgery.

## 2021-04-03 NOTE — Progress Notes (Signed)
PLEASE WEAR A MASK OUT IN PUBLIC AND SOCIAL DISTANCE AND [place] YOUR HANDS FREQUENTLY. PLEASE ASK ALL YOUR CLOSE HOUSEHOLD CONTACT TO WEAR MASK OUT IN PUBLIC AND SOCIAL DISTANCE AND [place] HANDS FREQUENTLY ALSO.      Your procedure is scheduled on Wednesday, April 12, 2021.  Report to [facility] AT  6:30 AM.   Call this number if you have problems the morning of surgery  :[phone].   OUR ADDRESS IS [place].  WE ARE LOCATED IN [facility].  PLEASE BRING YOUR INSURANCE CARD AND PHOTO ID DAY OF SURGERY.  ONLY ONE PERSON ALLOWED IN FACILITY WAITING AREA.                                     REMEMBER:  DO NOT EAT FOOD, CANDY GUM OR MINTS  AFTER MIDNIGHT THE NIGHT BEFORE YOUR SURGERY . YOU MAY HAVE CLEAR LIQUIDS FROM MIDNIGHT THE NIGHT BEFORE YOUR SURGERY UNTIL  5:30 AM. NO CLEAR LIQUIDS AFTER   5:30 AM DAY OF SURGERY.  NO ALCOHOL 24 HOURS BEFORE SURGERY.   YOU MAY  BRUSH YOUR TEETH MORNING OF SURGERY AND RINSE YOUR MOUTH OUT, NO CHEWING GUM CANDY OR MINTS.    CLEAR LIQUID DIET   Foods Allowed                                                                     Foods Excluded  Coffee and tea, regular and decaf                             liquids that you cannot  Plain Jell-O any favor except red or purple                                           see through such as: Fruit ices (not with fruit pulp)                                     milk, soups, orange juice  Iced Popsicles                                    All solid food Carbonated beverages, regular and diet                                    Cranberry, grape and apple juices Sports drinks like Gatorade  Sample Menu Breakfast                                Lunch  Supper Cranberry juice                                           Jell-O                                     Grape juice                           Apple juice Coffee or tea                         Jell-O                                      Popsicle                                                Coffee or tea                        Coffee or tea  _____________________________________________________________________     TAKE THESE MEDICATIONS MORNING OF SURGERY WITH A SIP OF WATER:  Zyrtec, Protonix, Flonase if needed, Flovent inhaler if needed, Ventolin inhaler if needed DO NOT TAKE WEGOVY ON THE MORNING OF SURGERY.  PLEASE BRING INHALERS & CPAP ON THE DAY OF SURGERY.  ONE VISITOR IS ALLOWED IN WAITING ROOM ONLY DAY OF SURGERY.  YOU MAY HAVE ANOTHER PERSON SWITCH OUT WITH THE  1  VISITOR IN THE WAITING ROOM DAY OF SURGERY AND A MASK MUST BE WORN IN THE WAITING ROOM.    2 VISITORS  MAY VISIT IN THE EXTENDED RECOVERY ROOM UNTIL 800 PM ONLY 1 VISITOR AGE 52 AND OVER MAY SPEND THE NIGHT AND MUST BE IN EXTENDED RECOVERY ROOM NO LATER THAN 800 PM .    UP TO 2 CHILDREN AGE 1 TO 15 MAY ALSO VISIT IN EXTENDED RECOV ERY ROOM ONLY UNTIL 800 PM AND MUST LEAVE BY 800 PM.   ALL PERSONS VISITING IN EXTENDED RECOVERY ROOM MUST WEAR A MASK.                                    DO NOT WEAR JEWERLY, MAKE UP. DO NOT WEAR LOTIONS, POWDERS, PERFUMES OR NAIL POLISH ON YOUR FINGERNAILS. TOENAIL POLISH IS OK TO WEAR. DO NOT SHAVE FOR 48 HOURS PRIOR TO DAY OF SURGERY. MEN MAY SHAVE FACE AND NECK. CONTACTS, GLASSES, OR DENTURES MAY NOT BE WORN TO SURGERY.                                    [facility] IS NOT RESPONSIBLE  FOR ANY BELONGINGS.                                                                    Marland Kitchen   - Preparing for Surgery Before surgery, you can play an important role.  Because skin is not sterile, your skin needs to be as free of germs as possible.  You can reduce the number of germs on your skin by washing with CHG (chlorahexidine gluconate) soap before surgery.  CHG is an antiseptic cleaner which kills germs and bonds with the skin to continue killing germs even  after washing. Please DO NOT use if you have an allergy to CHG or antibacterial soaps.  If your skin becomes reddened/irritated stop using the CHG and inform your nurse when you arrive at Short Stay. Do not shave (including legs and underarms) for at least 48 hours prior to the first CHG shower.  You may shave your face/neck. Please follow these instructions carefully:  1.  Shower with CHG Soap the night before surgery and the  morning of Surgery.  2.  If you choose to wash your hair, wash your hair first as usual with your  normal  shampoo.  3.  After you shampoo, rinse your hair and body thoroughly to remove the  shampoo.                            4.  Use CHG as you would any other liquid soap.  You can apply chg directly  to the skin and wash                      Gently with a scrungie or clean washcloth.  5.  Apply the CHG Soap to your body ONLY FROM THE NECK DOWN.   Do not use on face/ open                           Wound or open sores. Avoid contact with eyes, ears mouth and genitals (private parts).                       Wash face,  Genitals (private parts) with your normal soap.             6.  Wash thoroughly, paying special attention to the area where your surgery  will be performed.  7.  Thoroughly rinse your body with warm water from the neck down.  8.  DO NOT shower/wash with your normal soap after using and rinsing off  the CHG Soap.                9.  Pat yourself dry with a clean towel.            10.  Wear clean pajamas.            11.  Place clean sheets on your bed the night of your first shower and do not  sleep with pets. Day of Surgery : Do not apply any lotions/deodorants the morning of surgery.  Please wear clean clothes to the hospital/surgery center.  IF YOU HAVE ANY SKIN IRRITATION OR PROBLEMS WITH THE SURGICAL SOAP, PLEASE GET A BAR OF GOLD DIAL SOAP AND SHOWER THE NIGHT BEFORE YOUR SURGERY AND THE MORNING OF YOUR SURGERY. PLEASE LET THE NURSE KNOW MORNING OF YOUR  SURGERY IF YOU HAD ANY PROBLEMS WITH THE SURGICAL SOAP.   ________________________________________________________________________  QUESTIONS CALL Emmelina Mcloughlin PRE OP NURSE PHONE [phone].

## 2021-04-03 NOTE — Progress Notes (Signed)
Patient's surgery was moved from 03/15/21 to 04/12/21 due to patient having Covid on 03/09/21. I did pre-op phone call and reviewed history with patient on 03/06/21. As of 04/03/21 phone call, she stated no changes in her medical history.

## 2021-04-05 ENCOUNTER — Other Ambulatory Visit (HOSPITAL_COMMUNITY): Payer: Self-pay

## 2021-04-10 ENCOUNTER — Encounter (HOSPITAL_COMMUNITY)
Admission: RE | Admit: 2021-04-10 | Discharge: 2021-04-10 | Disposition: A | Discharge status: Home / Self Care | Payer: BC Managed Care – PPO | Source: Ambulatory Visit | Attending: Obstetrics and Gynecology | Admitting: Obstetrics and Gynecology

## 2021-04-10 ENCOUNTER — Other Ambulatory Visit: Payer: Self-pay

## 2021-04-10 DIAGNOSIS — N939 Abnormal uterine and vaginal bleeding, unspecified: Secondary | ICD-10-CM | POA: Diagnosis present

## 2021-04-10 DIAGNOSIS — R7303 Prediabetes: Secondary | ICD-10-CM | POA: Insufficient documentation

## 2021-04-10 DIAGNOSIS — I10 Essential (primary) hypertension: Secondary | ICD-10-CM | POA: Diagnosis not present

## 2021-04-10 DIAGNOSIS — N8003 Adenomyosis of the uterus: Secondary | ICD-10-CM | POA: Diagnosis not present

## 2021-04-10 DIAGNOSIS — J45909 Unspecified asthma, uncomplicated: Secondary | ICD-10-CM | POA: Diagnosis not present

## 2021-04-10 DIAGNOSIS — Z01818 Encounter for other preprocedural examination: Secondary | ICD-10-CM | POA: Insufficient documentation

## 2021-04-10 DIAGNOSIS — Z91040 Latex allergy status: Secondary | ICD-10-CM | POA: Diagnosis not present

## 2021-04-10 DIAGNOSIS — D259 Leiomyoma of uterus, unspecified: Secondary | ICD-10-CM | POA: Diagnosis not present

## 2021-04-10 LAB — CBC
HCT: 40.8 % (ref 36.0–46.0)
Hemoglobin: 14.1 g/dL (ref 12.0–15.0)
MCH: 30.5 pg (ref 26.0–34.0)
MCHC: 34.6 g/dL (ref 30.0–36.0)
MCV: 88.3 fL (ref 80.0–100.0)
Platelets: 270 10*3/uL (ref 150–400)
RBC: 4.62 MIL/uL (ref 3.87–5.11)
RDW: 13.8 % (ref 11.5–15.5)
WBC: 4.7 10*3/uL (ref 4.0–10.5)
nRBC: 0 % (ref 0.0–0.2)

## 2021-04-10 LAB — BASIC METABOLIC PANEL
Anion gap: 6 (ref 5–15)
BUN: 12 mg/dL (ref 6–20)
CO2: 24 mmol/L (ref 22–32)
Calcium: 8.7 mg/dL — ABNORMAL LOW (ref 8.9–10.3)
Chloride: 105 mmol/L (ref 98–111)
Creatinine, Ser: 0.91 mg/dL (ref 0.44–1.00)
GFR, Estimated: >60 mL/min (ref >60)
Glucose, Bld: 82 mg/dL (ref 70–99)
Potassium: 3.7 mmol/L (ref 3.5–5.1)
Sodium: 135 mmol/L (ref 135–145)

## 2021-04-11 ENCOUNTER — Encounter (INDEPENDENT_AMBULATORY_CARE_PROVIDER_SITE_OTHER): Payer: Self-pay | Admitting: Family Medicine

## 2021-04-12 ENCOUNTER — Other Ambulatory Visit: Payer: Self-pay

## 2021-04-12 ENCOUNTER — Encounter (HOSPITAL_BASED_OUTPATIENT_CLINIC_OR_DEPARTMENT_OTHER)
Admission: RE | Disposition: A | Discharge status: Home / Self Care | Payer: Self-pay | Source: Home / Self Care | Attending: Obstetrics and Gynecology

## 2021-04-12 ENCOUNTER — Encounter (HOSPITAL_BASED_OUTPATIENT_CLINIC_OR_DEPARTMENT_OTHER): Payer: Self-pay | Admitting: Obstetrics and Gynecology

## 2021-04-12 ENCOUNTER — Ambulatory Visit (HOSPITAL_BASED_OUTPATIENT_CLINIC_OR_DEPARTMENT_OTHER): Payer: BC Managed Care – PPO | Admitting: Anesthesiology

## 2021-04-12 ENCOUNTER — Other Ambulatory Visit (HOSPITAL_COMMUNITY): Payer: Self-pay

## 2021-04-12 ENCOUNTER — Observation Stay (HOSPITAL_BASED_OUTPATIENT_CLINIC_OR_DEPARTMENT_OTHER)
Admission: RE | Admit: 2021-04-12 | Discharge: 2021-04-12 | Disposition: A | Discharge status: Home / Self Care | Payer: BC Managed Care – PPO | Attending: Obstetrics and Gynecology | Admitting: Obstetrics and Gynecology

## 2021-04-12 DIAGNOSIS — J45909 Unspecified asthma, uncomplicated: Secondary | ICD-10-CM | POA: Diagnosis not present

## 2021-04-12 DIAGNOSIS — R7303 Prediabetes: Secondary | ICD-10-CM

## 2021-04-12 DIAGNOSIS — N8003 Adenomyosis of the uterus: Secondary | ICD-10-CM | POA: Insufficient documentation

## 2021-04-12 DIAGNOSIS — I10 Essential (primary) hypertension: Secondary | ICD-10-CM | POA: Insufficient documentation

## 2021-04-12 DIAGNOSIS — Z01818 Encounter for other preprocedural examination: Secondary | ICD-10-CM

## 2021-04-12 DIAGNOSIS — D259 Leiomyoma of uterus, unspecified: Secondary | ICD-10-CM | POA: Diagnosis not present

## 2021-04-12 DIAGNOSIS — Z91040 Latex allergy status: Secondary | ICD-10-CM | POA: Diagnosis not present

## 2021-04-12 DIAGNOSIS — N939 Abnormal uterine and vaginal bleeding, unspecified: Secondary | ICD-10-CM | POA: Diagnosis present

## 2021-04-12 HISTORY — DX: Sleep apnea, unspecified: G47.30

## 2021-04-12 HISTORY — PX: ROBOTIC ASSISTED LAPAROSCOPIC HYSTERECTOMY AND SALPINGECTOMY: SHX6379

## 2021-04-12 HISTORY — DX: Prediabetes: R73.03

## 2021-04-12 HISTORY — DX: Presence of spectacles and contact lenses: Z97.3

## 2021-04-12 LAB — TYPE AND SCREEN
ABO/RH(D): A POS
Antibody Screen: NEGATIVE

## 2021-04-12 LAB — ABO/RH: ABO/RH(D): A POS

## 2021-04-12 LAB — POCT PREGNANCY, URINE: Preg Test, Ur: NEGATIVE

## 2021-04-12 SURGERY — XI ROBOTIC ASSISTED LAPAROSCOPIC HYSTERECTOMY AND SALPINGECTOMY
Anesthesia: General | Site: Abdomen | Laterality: Bilateral

## 2021-04-12 MED ORDER — OXYCODONE HCL 5 MG PO TABS
ORAL_TABLET | ORAL | Status: AC
  Filled 2021-04-12: qty 1

## 2021-04-12 MED ORDER — METHYLENE BLUE 1 % INJ SOLN
INTRAVENOUS | Status: DC | PRN
  Administered 2021-04-12: 10 mL

## 2021-04-12 MED ORDER — CEFAZOLIN SODIUM-DEXTROSE 2-4 GM/100ML-% IV SOLN
INTRAVENOUS | Status: AC
  Filled 2021-04-12: qty 100

## 2021-04-12 MED ORDER — HYDROMORPHONE HCL 1 MG/ML IJ SOLN
INTRAMUSCULAR | Status: DC | PRN
  Administered 2021-04-12 (×2): 1 mg via INTRAVENOUS

## 2021-04-12 MED ORDER — ROCURONIUM BROMIDE 10 MG/ML (PF) SYRINGE
PREFILLED_SYRINGE | INTRAVENOUS | Status: AC
  Filled 2021-04-12: qty 10

## 2021-04-12 MED ORDER — LIDOCAINE HCL (PF) 2 % IJ SOLN
INTRAMUSCULAR | Status: AC
  Filled 2021-04-12: qty 5

## 2021-04-12 MED ORDER — HYDRALAZINE HCL 20 MG/ML IJ SOLN
INTRAMUSCULAR | Status: AC
  Filled 2021-04-12: qty 1

## 2021-04-12 MED ORDER — ONDANSETRON HCL 4 MG/2ML IJ SOLN
INTRAMUSCULAR | Status: AC
  Filled 2021-04-12: qty 2

## 2021-04-12 MED ORDER — SIMETHICONE 80 MG PO CHEW
CHEWABLE_TABLET | ORAL | Status: AC
  Filled 2021-04-12: qty 1

## 2021-04-12 MED ORDER — IBUPROFEN 800 MG PO TABS
800.0000 mg | ORAL_TABLET | Freq: Four times a day (QID) | ORAL | 0 refills | Status: DC | PRN
  Filled 2021-04-12: qty 30, 8d supply, fill #0

## 2021-04-12 MED ORDER — SUGAMMADEX SODIUM 500 MG/5ML IV SOLN
INTRAVENOUS | Status: AC
  Filled 2021-04-12: qty 5

## 2021-04-12 MED ORDER — HYDROMORPHONE HCL 2 MG/ML IJ SOLN
INTRAMUSCULAR | Status: AC
  Filled 2021-04-12: qty 1

## 2021-04-12 MED ORDER — ONDANSETRON HCL 4 MG/2ML IJ SOLN
INTRAMUSCULAR | Status: DC | PRN
  Administered 2021-04-12: 4 mg via INTRAVENOUS

## 2021-04-12 MED ORDER — HYDROMORPHONE HCL 1 MG/ML IJ SOLN: 0.2500 mg | INTRAMUSCULAR | Status: DC | PRN

## 2021-04-12 MED ORDER — PANTOPRAZOLE SODIUM 40 MG IV SOLR: 40.0000 mg | Freq: Every day | INTRAVENOUS | Status: DC

## 2021-04-12 MED ORDER — SCOPOLAMINE 1 MG/3DAYS TD PT72
1.0000 | MEDICATED_PATCH | TRANSDERMAL | Status: DC
  Administered 2021-04-12: 1.5 mg via TRANSDERMAL

## 2021-04-12 MED ORDER — LACTATED RINGERS IV SOLN: INTRAVENOUS | Status: DC

## 2021-04-12 MED ORDER — OXYCODONE HCL 5 MG PO TABS: 5.0000 mg | ORAL_TABLET | Freq: Once | ORAL | Status: DC | PRN

## 2021-04-12 MED ORDER — CEFAZOLIN SODIUM-DEXTROSE 2-4 GM/100ML-% IV SOLN
2.0000 g | INTRAVENOUS | Status: AC
  Administered 2021-04-12: 2 g via INTRAVENOUS

## 2021-04-12 MED ORDER — ROCURONIUM BROMIDE 10 MG/ML (PF) SYRINGE
PREFILLED_SYRINGE | INTRAVENOUS | Status: AC
Start: 2021-04-12 — End: ?
  Filled 2021-04-12: qty 10

## 2021-04-12 MED ORDER — SUGAMMADEX SODIUM 500 MG/5ML IV SOLN
INTRAVENOUS | Status: DC | PRN
  Administered 2021-04-12: 500 mg via INTRAVENOUS

## 2021-04-12 MED ORDER — SODIUM CHLORIDE 0.9 % IV SOLN
INTRAVENOUS | Status: DC | PRN
  Administered 2021-04-12: 100 mL

## 2021-04-12 MED ORDER — DROPERIDOL 2.5 MG/ML IJ SOLN
INTRAMUSCULAR | Status: DC | PRN
  Administered 2021-04-12: .625 mg via INTRAVENOUS

## 2021-04-12 MED ORDER — KETOROLAC TROMETHAMINE 30 MG/ML IJ SOLN: 30.0000 mg | Freq: Once | INTRAMUSCULAR | Status: DC | PRN

## 2021-04-12 MED ORDER — ACETAMINOPHEN 10 MG/ML IV SOLN
INTRAVENOUS | Status: DC | PRN
  Administered 2021-04-12: 1000 mg via INTRAVENOUS

## 2021-04-12 MED ORDER — SODIUM CHLORIDE 0.9 % IR SOLN
Status: DC | PRN
  Administered 2021-04-12 (×2): 1000 mL

## 2021-04-12 MED ORDER — KETOROLAC TROMETHAMINE 30 MG/ML IJ SOLN
INTRAMUSCULAR | Status: AC
  Filled 2021-04-12: qty 1

## 2021-04-12 MED ORDER — ONDANSETRON HCL 4 MG PO TABS: 4.0000 mg | ORAL_TABLET | Freq: Four times a day (QID) | ORAL | Status: DC | PRN

## 2021-04-12 MED ORDER — ACETAMINOPHEN 10 MG/ML IV SOLN
INTRAVENOUS | Status: AC
  Filled 2021-04-12: qty 100

## 2021-04-12 MED ORDER — OXYCODONE HCL 5 MG PO TABS
ORAL_TABLET | ORAL | Status: AC
  Filled 2021-04-12: qty 2

## 2021-04-12 MED ORDER — FENTANYL CITRATE (PF) 100 MCG/2ML IJ SOLN
INTRAMUSCULAR | Status: DC | PRN
Start: 2021-04-12 — End: 2021-04-12
  Administered 2021-04-12: 150 ug via INTRAVENOUS
  Administered 2021-04-12: 100 ug via INTRAVENOUS

## 2021-04-12 MED ORDER — FENTANYL CITRATE (PF) 250 MCG/5ML IJ SOLN
INTRAMUSCULAR | Status: AC
  Filled 2021-04-12: qty 5

## 2021-04-12 MED ORDER — KETOROLAC TROMETHAMINE 30 MG/ML IJ SOLN: 30.0000 mg | Freq: Once | INTRAMUSCULAR | Status: DC

## 2021-04-12 MED ORDER — ONDANSETRON HCL 4 MG/2ML IJ SOLN: 4.0000 mg | Freq: Four times a day (QID) | INTRAMUSCULAR | Status: DC | PRN

## 2021-04-12 MED ORDER — SCOPOLAMINE 1 MG/3DAYS TD PT72
MEDICATED_PATCH | TRANSDERMAL | Status: AC
  Filled 2021-04-12: qty 1

## 2021-04-12 MED ORDER — IBUPROFEN 200 MG PO TABS: 600.0000 mg | ORAL_TABLET | Freq: Four times a day (QID) | ORAL | Status: DC

## 2021-04-12 MED ORDER — STERILE WATER FOR IRRIGATION IR SOLN
Status: DC | PRN
  Administered 2021-04-12: 500 mL

## 2021-04-12 MED ORDER — ONDANSETRON HCL 4 MG/2ML IJ SOLN: 4.0000 mg | Freq: Once | INTRAMUSCULAR | Status: DC | PRN

## 2021-04-12 MED ORDER — HEMOSTATIC AGENTS (NO CHARGE) OPTIME
TOPICAL | Status: DC | PRN
  Administered 2021-04-12: 1

## 2021-04-12 MED ORDER — PROPOFOL 10 MG/ML IV BOLUS
INTRAVENOUS | Status: AC
  Filled 2021-04-12: qty 20

## 2021-04-12 MED ORDER — ACETAMINOPHEN 500 MG PO TABS: 1000.0000 mg | ORAL_TABLET | Freq: Four times a day (QID) | ORAL | Status: DC

## 2021-04-12 MED ORDER — OXYCODONE HCL 5 MG PO TABS
5.0000 mg | ORAL_TABLET | Freq: Four times a day (QID) | ORAL | 0 refills | Status: DC | PRN
Start: 2021-04-12 — End: 2021-09-13
  Filled 2021-04-12: qty 20, 5d supply, fill #0

## 2021-04-12 MED ORDER — KETOROLAC TROMETHAMINE 30 MG/ML IJ SOLN
INTRAMUSCULAR | Status: DC | PRN
  Administered 2021-04-12: 30 mg via INTRAVENOUS

## 2021-04-12 MED ORDER — SIMETHICONE 80 MG PO CHEW
80.0000 mg | CHEWABLE_TABLET | Freq: Four times a day (QID) | ORAL | Status: DC | PRN
  Administered 2021-04-12: 80 mg via ORAL

## 2021-04-12 MED ORDER — DEXAMETHASONE SODIUM PHOSPHATE 10 MG/ML IJ SOLN
INTRAMUSCULAR | Status: AC
  Filled 2021-04-12: qty 1

## 2021-04-12 MED ORDER — PROPOFOL 10 MG/ML IV BOLUS
INTRAVENOUS | Status: DC | PRN
  Administered 2021-04-12: 150 mg via INTRAVENOUS
  Administered 2021-04-12: 50 mg via INTRAVENOUS

## 2021-04-12 MED ORDER — PROPOFOL 10 MG/ML IV BOLUS
INTRAVENOUS | Status: AC
Start: 2021-04-12 — End: ?
  Filled 2021-04-12: qty 20

## 2021-04-12 MED ORDER — HYDRALAZINE HCL 20 MG/ML IJ SOLN
5.0000 mg | Freq: Once | INTRAMUSCULAR | Status: AC
  Administered 2021-04-12: 5 mg via INTRAVENOUS

## 2021-04-12 MED ORDER — ROCURONIUM BROMIDE 100 MG/10ML IV SOLN
INTRAVENOUS | Status: DC | PRN
  Administered 2021-04-12: 80 mg via INTRAVENOUS
  Administered 2021-04-12: 40 mg via INTRAVENOUS
  Administered 2021-04-12: 20 mg via INTRAVENOUS

## 2021-04-12 MED ORDER — MIDAZOLAM HCL 5 MG/5ML IJ SOLN
INTRAMUSCULAR | Status: DC | PRN
  Administered 2021-04-12: 2 mg via INTRAVENOUS

## 2021-04-12 MED ORDER — OXYCODONE HCL 5 MG PO TABS
5.0000 mg | ORAL_TABLET | ORAL | Status: DC | PRN
  Administered 2021-04-12 (×2): 5 mg via ORAL

## 2021-04-12 MED ORDER — DEXAMETHASONE SODIUM PHOSPHATE 10 MG/ML IJ SOLN
INTRAMUSCULAR | Status: DC | PRN
Start: 2021-04-12 — End: 2021-04-12
  Administered 2021-04-12: 10 mg via INTRAVENOUS

## 2021-04-12 MED ORDER — MIDAZOLAM HCL 2 MG/2ML IJ SOLN
INTRAMUSCULAR | Status: AC
  Filled 2021-04-12: qty 2

## 2021-04-12 MED ORDER — OXYCODONE HCL 5 MG/5ML PO SOLN: 5.0000 mg | Freq: Once | ORAL | Status: DC | PRN

## 2021-04-12 MED ORDER — LIDOCAINE HCL (CARDIAC) PF 100 MG/5ML IV SOSY
PREFILLED_SYRINGE | INTRAVENOUS | Status: DC | PRN
  Administered 2021-04-12: 50 mg via INTRAVENOUS

## 2021-04-12 MED ORDER — HYDROMORPHONE HCL 1 MG/ML IJ SOLN: 0.2000 mg | INTRAMUSCULAR | Status: DC | PRN

## 2021-04-12 SURGICAL SUPPLY — 70 items
APL SRG 38 LTWT LNG FL B (MISCELLANEOUS) ×1
APPLICATOR ARISTA FLEXITIP XL (MISCELLANEOUS) ×1 IMPLANT
CATH SILICONE 16FRX5CC (CATHETERS) ×1 IMPLANT
CELLS DAT CNTRL 66122 CELL SVR (MISCELLANEOUS) IMPLANT
COVER BACK TABLE 60X90IN (DRAPES) ×2 IMPLANT
COVER SURGICAL LIGHT HANDLE (MISCELLANEOUS) ×1 IMPLANT
COVER TIP SHEARS 8 DVNC (MISCELLANEOUS) ×1 IMPLANT
COVER TIP SHEARS 8MM DA VINCI (MISCELLANEOUS) ×2
DEFOGGER SCOPE WARMER CLEARIFY (MISCELLANEOUS) ×2 IMPLANT
DERMABOND ADVANCED (GAUZE/BANDAGES/DRESSINGS) ×1
DERMABOND ADVANCED .7 DNX12 (GAUZE/BANDAGES/DRESSINGS) ×1 IMPLANT
DILATOR CANAL MILEX (MISCELLANEOUS) ×2 IMPLANT
DRAPE ARM DVNC X/XI (DISPOSABLE) ×4 IMPLANT
DRAPE COLUMN DVNC XI (DISPOSABLE) ×1 IMPLANT
DRAPE DA VINCI XI ARM (DISPOSABLE) ×8
DRAPE DA VINCI XI COLUMN (DISPOSABLE) ×2
DRAPE UTILITY 15X26 TOWEL STRL (DRAPES) ×2 IMPLANT
DRAPE UTILITY XL STRL (DRAPES) ×2 IMPLANT
DURAPREP 26ML APPLICATOR (WOUND CARE) ×2 IMPLANT
ELECT REM PT RETURN 9FT ADLT (ELECTROSURGICAL) ×2
ELECTRODE REM PT RTRN 9FT ADLT (ELECTROSURGICAL) ×1 IMPLANT
GAUZE 4X4 16PLY ~~LOC~~+RFID DBL (SPONGE) ×4 IMPLANT
GLOVE SURG POLYISO LF SZ6.5 (GLOVE) ×5 IMPLANT
GLOVE SURG POLYISO LF SZ7 (GLOVE) ×2 IMPLANT
GLOVE SURG UNDER POLY LF SZ6.5 (GLOVE) ×12 IMPLANT
GLOVE SURG UNDER POLY LF SZ7 (GLOVE) ×2 IMPLANT
GLOVE SURG UNDER POLY LF SZ7.5 (GLOVE) ×1 IMPLANT
GOWN STRL REUS W/TWL XL LVL3 (GOWN DISPOSABLE) ×1 IMPLANT
HEMOSTAT ARISTA ABSORB 3G PWDR (HEMOSTASIS) ×1 IMPLANT
HOLDER FOLEY CATH W/STRAP (MISCELLANEOUS) ×1 IMPLANT
IRRIG SUCT STRYKERFLOW 2 WTIP (MISCELLANEOUS) ×2
IRRIGATION SUCT STRKRFLW 2 WTP (MISCELLANEOUS) ×1 IMPLANT
IV NS 1000ML (IV SOLUTION) ×4
IV NS 1000ML BAXH (IV SOLUTION) IMPLANT
KIT TURNOVER CYSTO (KITS) ×2 IMPLANT
LEGGING LITHOTOMY PAIR STRL (DRAPES) ×2 IMPLANT
MANIFOLD NEPTUNE II (INSTRUMENTS) ×1 IMPLANT
OBTURATOR OPTICAL STANDARD 8MM (TROCAR) ×2
OBTURATOR OPTICAL STND 8 DVNC (TROCAR) ×1
OBTURATOR OPTICALSTD 8 DVNC (TROCAR) IMPLANT
OCCLUDER COLPOPNEUMO (BALLOONS) ×2 IMPLANT
PACK ROBOT WH (CUSTOM PROCEDURE TRAY) ×2 IMPLANT
PACK ROBOTIC GOWN (GOWN DISPOSABLE) ×2 IMPLANT
PACK TRENDGUARD 450 HYBRID PRO (MISCELLANEOUS) IMPLANT
PAD OB MATERNITY 4.3X12.25 (PERSONAL CARE ITEMS) ×2 IMPLANT
PAD PREP 24X48 CUFFED NSTRL (MISCELLANEOUS) ×2 IMPLANT
PROTECTOR NERVE ULNAR (MISCELLANEOUS) ×2 IMPLANT
RETRACTOR WND ALEXIS 18 MED (MISCELLANEOUS) IMPLANT
RTRCTR WOUND ALEXIS 18CM MED (MISCELLANEOUS)
RTRCTR WOUND ALEXIS 18CM SML (INSTRUMENTS)
SAVER CELL AAL HAEMONETICS (INSTRUMENTS) IMPLANT
SCISSORS LAP 5X45 EPIX DISP (ENDOMECHANICALS) IMPLANT
SEAL CANN UNIV 5-8 DVNC XI (MISCELLANEOUS) ×4 IMPLANT
SEAL XI 5MM-8MM UNIVERSAL (MISCELLANEOUS) ×8
SEALER VESSEL DA VINCI XI (MISCELLANEOUS) ×2
SEALER VESSEL EXT DVNC XI (MISCELLANEOUS) IMPLANT
SET IRRIG Y TYPE TUR BLADDER L (SET/KITS/TRAYS/PACK) ×1 IMPLANT
SET TRI-LUMEN FLTR TB AIRSEAL (TUBING) ×1 IMPLANT
SPONGE T-LAP 4X18 ~~LOC~~+RFID (SPONGE) ×2 IMPLANT
SUT VIC AB 0 CT1 27 (SUTURE) ×4
SUT VIC AB 0 CT1 27XBRD ANBCTR (SUTURE) ×2 IMPLANT
SUT VICRYL 0 UR6 27IN ABS (SUTURE) IMPLANT
SUT VICRYL RAPIDE 4/0 PS 2 (SUTURE) ×8 IMPLANT
SUT VLOC 180 0 9IN  GS21 (SUTURE) ×2
SUT VLOC 180 0 9IN GS21 (SUTURE) ×1 IMPLANT
TIP UTERINE 6.7X8CM BLUE DISP (MISCELLANEOUS) ×1 IMPLANT
TOWEL OR 17X26 10 PK STRL BLUE (TOWEL DISPOSABLE) ×2 IMPLANT
TRENDGUARD 450 HYBRID PRO PACK (MISCELLANEOUS) ×2
TROCAR PORT AIRSEAL 8X120 (TROCAR) ×2 IMPLANT
WATER STERILE IRR 1000ML POUR (IV SOLUTION) ×2 IMPLANT

## 2021-04-12 NOTE — Telephone Encounter (Signed)
Please advise 

## 2021-04-12 NOTE — Anesthesia Procedure Notes (Signed)
Procedure Name: Intubation ?Date/Time: 04/12/2021 8:49 AM ?Performed by: Jonna Munro, CRNA ?Pre-anesthesia Checklist: Patient identified, Emergency Drugs available, Suction available, Patient being monitored and Timeout performed ?Patient Re-evaluated:Patient Re-evaluated prior to induction ?Oxygen Delivery Method: Circle system utilized ?Preoxygenation: Pre-oxygenation with 100% oxygen ?Induction Type: IV induction ?Ventilation: Mask ventilation without difficulty and Oral airway inserted - appropriate to patient size ?Laryngoscope Size: Mac and 3 ?Grade View: Grade I ?Tube type: Oral ?Tube size: 7.0 mm ?Number of attempts: 1 ?Airway Equipment and Method: Stylet ?Placement Confirmation: ETT inserted through vocal cords under direct vision, positive ETCO2 and breath sounds checked- equal and bilateral ?Secured at: 22 cm ?Tube secured with: Tape ?Dental Injury: Teeth and Oropharynx as per pre-operative assessment  ? ? ? ? ?

## 2021-04-12 NOTE — Discharge Summary (Signed)
Physician Discharge Summary  ?Patient ID: ?Pamela [name] ?MRN: [id number] ?DOB/AGE: 05-07-1970 51 y.o. ? ?Admit date: 04/12/2021 ?Discharge date: 04/12/2021 ? ?Admission Diagnoses: ?Abnormal uterine bleeding ?Fibroid uterus ? ?Discharge Diagnoses:  ?Principal Problem: ?  Abnormal uterine bleeding (AUB) ?Fibroid uterus ? ?Procedure(s): Robotic Assisted Total Laparoscopic Hysterectomy and Bilateral Salpingectomy ? ?Discharged Condition: good ? ?Hospital Course: Patient was admitted on 04/12/2021 for the above named procedure(s) for the above named diagnoses. Prior to hospital discharge, patient was tolerating PO, ambulating, voiding spontaneously, passing flatus, and pain was well-controlled. See hospital chart for specific details. Patient was discharged home in stable condition. ? ?Consults: None ? ?Significant Diagnostic Studies: None ? ?Treatments: surgery: As documented above ? ?Discharge Exam: ?Blood pressure (!) 160/83, pulse 69, temperature 98.2 ?F (36.8 ?C), resp. rate 16, height '5\' 5"'$  (1.651 m), weight 119.3 kg, last menstrual period 03/16/2021, SpO2 99 %. ? ? ?Disposition: Discharge disposition: 01-Home or Self Care ? ? ? ? ? ? ?Discharge Instructions   ? ? Discharge patient   Complete by: As directed ?  ? Discharge disposition: 01-Home or Self Care  ? Discharge patient date: 04/12/2021  ? Discharge patient   Complete by: As directed ?  ? Discharge disposition: 01-Home or Self Care  ? Discharge patient date: 04/12/2021  ? ?  ? ?Allergies as of 04/12/2021   ? ?   Reactions  ? Molds & Smuts   ? Morphine And Related Nausea Only  ? Latex Rash  ? Pt states she is not sure if she is allergic to latex or adhesives. Bandaids bother her if they are left on too long.  ? Wound Dressing Adhesive Rash  ? ?  ? ?  ?Medication List  ?  ? ?STOP taking these medications   ? ?Paxlovid (300/100) 20 x 150 MG & 10 x '100MG'$  Tbpk ?Generic drug: nirmatrelvir & ritonavir ?  ? ?  ? ?TAKE these medications   ? ?albuterol 108 (90 Base) MCG/ACT  inhaler ?Commonly known as: Ventolin HFA ?Inhale 2 puffs into the lungs every 6 (six) hours as needed for wheezing or shortness of breath. ?  ?cetirizine 10 MG tablet ?Commonly known as: ZYRTEC ?Take 10 mg by mouth daily. ?  ?DAILY HERBS IMMUNE DEFENSE PO ?Take by mouth. ?  ?ELDERBERRY PO ?Take by mouth. Gummies ?  ?fluticasone 110 MCG/ACT inhaler ?Commonly known as: Flovent HFA ?For asthma flare, inhale 2 puffs into the lungs 2 (two) times daily for 2 weeks or until cough and wheeze free. ?What changed: additional instructions ?  ?fluticasone 50 MCG/ACT nasal spray ?Commonly known as: FLONASE ?Place 2 sprays into both nostrils daily as needed for allergies or rhinitis. ?  ?ibuprofen 800 MG tablet ?Commonly known as: ADVIL ?Take 1 tablet (800 mg total) by mouth every 6 (six) hours as needed for moderate pain or mild pain. ?  ?losartan-hydrochlorothiazide 100-25 MG tablet ?Commonly known as: HYZAAR ?Take 1 tablet by mouth daily. ?What changed:  ?how much to take ?additional instructions ?  ?montelukast 10 MG tablet ?Commonly known as: SINGULAIR ?Take 1 tablet (10 mg total) by mouth at bedtime. ?  ?NON FORMULARY ?Fiber gummy, 3 gummies per day ?  ?oxyCODONE 5 MG immediate release tablet ?Commonly known as: Roxicodone ?Take 1 tablet (5 mg total) by mouth every 6 (six) hours as needed for severe pain or breakthrough pain. ?  ?pantoprazole 40 MG tablet ?Commonly known as: PROTONIX ?pantoprazole 40 mg tablet,delayed release ? TK 1 T PO D ?  ?UNABLE  TO FIND ?Take 65 mg by mouth every other day. Med Name: Nature's [place] Sia Iron ?  ?vitamin B-12 500 MCG tablet ?Commonly known as: CYANOCOBALAMIN ?400-500 mcg qd otc supplement ?  ?Vitamin D3 125 MCG (5000 UT) Tabs ?5,000 IU OTC vitamin D3 daily. ?  ?Wegovy 1.7 MG/0.75ML Soaj ?Generic drug: Semaglutide-Weight Management ?Inject 1.7 mg into the skin once a week. ?  ? ?  ? ? Follow-up Information   ? ? Drema Dallas, DO Follow up in 3 week(s).   ?Specialty: Obstetrics and  Gynecology ?Why: Please keep your post-operative visit in 3 weeks. ?Contact information: ?[address] ?[address] ?[place] [address] ?[phone] ? ? ?  ?  ? ?  ?  ? ?  ? ? ?Signed: ?Drema Dallas ?04/12/2021, 5:09 PM ? ? ?

## 2021-04-12 NOTE — Op Note (Signed)
Pre Op Dx:   ?1. Abnormal uterine bleeding ?2. Fibroid uterus ? ?Post Op Dx:   ?Same as pre-operative diagnoses ? ?Procedure:   ?Robotic Assisted Total Laparoscopic Hysterectomy with Bilateral Salpingectomy ?  ?Surgeon:  Dr. Drema Dallas ?Assistants:  Dr. Christophe Louis (assistant needed due to the complexity of the anatomy) ?Anesthesia:  General ?  ?EBL:  100cc  ?IVF:  1000cc ?UOP:  200cc ?  ?Drains:  Foley catheter ?Specimen removed:  Uterus, cervix, and bilateral fallopian tubes - sent to pathology ?Device(s) implanted: None ?Case Type:  Clean-contaminated ?Findings:  Enlarged ~12cm fibroid uterus with large ~6cm posterior cm fibroid, normal-appearing bilateral ovaries. Evidence of prior bilateral tubal ligation but otherwise normal-appearing fallopian tubes. Scarred bladder to lower uterine segment. Normal-appearing liver contours and gallbladder. Right ureter visualized with with peristalsis. Left ureter difficult to visualize. ?Complications: None ? ?Indications:  51 y.o. G3P1 s/p BTL with AUB-L who desired definitive surgical management. ? ?Description of each procedure:  After informed consent the patient was taken to the operating room and placed in dorsal supine position where general endotracheal anesthesia was administered and found to be adequate.  She was placed in dorsal lithotomy position with her arms tucked.  She was prepped and draped in the usual sterile fashion. A RUMI uterine manipulator with the Koh cup and a Foley catheter were placed.  A timeout was called and the procedure confirmed.   ? ?A 554m supraumbilical incision was made and a trochar was used to enter the abdomen under direct visualization.  Pneumoperitoneum was established and atraumatic entry confirmed. Three additional 563mports were placed on either side of the umbilicus and an 54m51mort was placed in the left upper quadrant under direct visualization. All port sites were injected with 10cc local anesthetic. The pelvis was bathed in  a 60cc Ropivicaine solution. ? ?The patient was placed in Trendelenburg position and the da [employer]botic device was docked.  Next, attention was turned to the console where the hysterectomy was performed.  The right fallopian tube was divided at the mesosalpinx and removed. This process was completed on the contralateral side. The uteroovarian anastamosis was divided and the right round ligament was divided.  This process was repeated on the contralateral side.  The anterior leaflet of the peritoneum was divided to create a bladder flap which proved very difficult due to the bladder which was adherent to the lower uterine segment. Methylene blue (200cc) was backfilled to ensure there was no bladder injury. The uterine artery and vein were skeletonized and desiccated superior to the Koh cup.  This process was repeated on the contralateral side. Uterine blanching was observed. A circumferential colpotomy was started but there was no visualization of the Koh up. The Robot was undocked and patient positioned to assess Koh cup positioning and it was confirmed to be in proper position. Bilateral labia majora were sutured to allow laterally temporarily for visualization. The Robot was docked again. The bladder was drained. A circumferential colpotomy was created along the ridge of the Koh cup and the uterus was passed off the field. It was determined that the initial colpotomy was superior to the location of the Koh cup.  The vaginal occluder was placed in the vagina to maintain pneumoperitoneum. ? ?The vaginal cuff was then closed with V-loc suture. Hemostasis confirmed. Arista powder was placed on the vaginal cuff and adnexa bilaterally.  The Da Vinc[employer] robotic device was undocked and all ports were visualized.  ? ?The pneumoperitoneum was reduced completely  with the assistance of two deep breaths and all ports were removed.  The skin was closed with 4-0 Vicryl in subcuticular fashion with skin glue placed atop each port  site.  ? ?The vagina was inspected and there were no vaginal tears noted and no foreign objects remaining in the vagina. The cuff was palpated and found to be intact. The bilateral labial sutures were cut and adequate hemostasis noted.The patient was returned to dorsal supine position, awakened and extubated in the OR having appeared to tolerate the procedure well.  All sponge, needle, and instrument counts were correct x 2 at the end of the case. ? ?Disposition:  PACU ? ?Drema Dallas, DO ? ?

## 2021-04-12 NOTE — Anesthesia Preprocedure Evaluation (Signed)
Anesthesia Evaluation  ?Patient identified by MRN, date of birth, ID band ?Patient awake ? ? ? ?Reviewed: ?Allergy & Precautions, NPO status , Patient's Chart, lab work & pertinent test results ? ?Airway ?Mallampati: II ? ?TM Distance: >3 FB ?Neck ROM: Full ? ? ? Dental ?no notable dental hx. ? ?  ?Pulmonary ?asthma , sleep apnea and Continuous Positive Airway Pressure Ventilation ,  ?  ?Pulmonary exam normal ?breath sounds clear to auscultation ? ? ? ? ? ? Cardiovascular ?hypertension, Normal cardiovascular exam ?Rhythm:Regular Rate:Normal ? ? ?  ?Neuro/Psych ?negative neurological ROS ? negative psych ROS  ? GI/Hepatic ?negative GI ROS, Neg liver ROS,   ?Endo/Other  ?Morbid obesity ? Renal/GU ?negative Renal ROS  ?negative genitourinary ?  ?Musculoskeletal ?negative musculoskeletal ROS ?(+)  ? Abdominal ?  ?Peds ?negative pediatric ROS ?(+)  Hematology ?negative hematology ROS ?(+)   ?Anesthesia Other Findings ? ? Reproductive/Obstetrics ?negative OB ROS ? ?  ? ? ? ? ? ? ? ? ? ? ? ? ? ?  ?  ? ? ? ? ? ? ? ? ?Anesthesia Physical ?Anesthesia Plan ? ?ASA: 3 ? ?Anesthesia Plan: General  ? ?Post-op Pain Management:   ? ?Induction: Intravenous ? ?PONV Risk Score and Plan: 3 and Ondansetron, Dexamethasone, Droperidol, Midazolam, Treatment may vary due to age or medical condition and Scopolamine patch - Pre-op ? ?Airway Management Planned: Oral ETT ? ?Additional Equipment:  ? ?Intra-op Plan:  ? ?Post-operative Plan: Extubation in OR ? ?Informed Consent: I have reviewed the patients History and Physical, chart, labs and discussed the procedure including the risks, benefits and alternatives for the proposed anesthesia with the patient or authorized representative who has indicated his/her understanding and acceptance.  ? ? ? ?Dental advisory given ? ?Plan Discussed with: CRNA and Surgeon ? ?Anesthesia Plan Comments:   ? ? ? ? ? ? ?Anesthesia Quick Evaluation ? ?

## 2021-04-12 NOTE — Discharge Instructions (Signed)
CHECK B/P after returning home and resume regular B/P meds in the AM ?

## 2021-04-12 NOTE — Anesthesia Postprocedure Evaluation (Signed)
Anesthesia Post Note ? ?Patient: Pamela [name] ? ?Procedure(s) Performed: XI ROBOTIC ASSISTED LAPAROSCOPIC HYSTERECTOMY AND BILATERAL SALPINGECTOMY (Bilateral: Abdomen) ? ?  ? ?Patient location during evaluation: PACU ?Anesthesia Type: General ?Level of consciousness: awake and alert ?Pain management: pain level controlled ?Vital Signs Assessment: post-procedure vital signs reviewed and stable ?Respiratory status: spontaneous breathing, nonlabored ventilation, respiratory function stable and patient connected to nasal cannula oxygen ?Cardiovascular status: blood pressure returned to baseline and stable ?Postop Assessment: no apparent nausea or vomiting ?Anesthetic complications: no ? ? ?No notable events documented. ? ?Last Vitals:  ?Vitals:  ? 04/12/21 1300 04/12/21 1320  ?BP: (!) 158/76 (!) 169/98  ?Pulse: 61 69  ?Resp: 10 16  ?Temp: 36.6 ?C 36.4 ?C  ?SpO2: 99% 95%  ?  ?Last Pain:  ?Vitals:  ? 04/12/21 1300  ?TempSrc:   ?PainSc: 2   ? ? ?  ?  ?  ?  ?  ?  ? ?Gianpaolo Mindel S ? ? ? ? ?

## 2021-04-12 NOTE — Interval H&P Note (Signed)
History and Physical Interval Note: ? ?04/12/2021 ?8:05 AM ? ?Pamela [name]  has presented today for surgery, with the diagnosis of Abnormal Uterine Bleeding.  The various methods of treatment have been discussed with the patient and family. After consideration of risks, benefits and other options for treatment, the patient has consented to  Procedure(s): ?XI ROBOTIC [place] SALPINGECTOMY (Bilateral) as a surgical intervention.  The patient's history has been reviewed, patient examined, no change in status, stable for surgery.  I have reviewed the patient's chart and labs.  Questions were answered to the patient's satisfaction.   ? ? ?Drema Dallas ? ? ?

## 2021-04-12 NOTE — Transfer of Care (Signed)
Immediate Anesthesia Transfer of Care Note ? ?Patient: Pamela [name] ? ?Procedure(s) Performed: XI ROBOTIC ASSISTED LAPAROSCOPIC HYSTERECTOMY AND BILATERAL SALPINGECTOMY (Bilateral: Abdomen) ? ?Patient Location: PACU ? ?Anesthesia Type:General ? ?Level of Consciousness: awake, alert , drowsy and patient cooperative ? ?Airway & Oxygen Therapy: Patient Spontanous Breathing and Patient connected to face mask oxygen ? ?Post-op Assessment: Report given to RN, Post -op Vital signs reviewed and stable and Patient moving all extremities X 4 ? ?Post vital signs: Reviewed and stable ? ?Last Vitals:  ?Vitals Value Taken Time  ?BP 146/67 04/12/21 1215  ?Temp    ?Pulse 70 04/12/21 1218  ?Resp 14 04/12/21 1218  ?SpO2 99 % 04/12/21 1218  ?Vitals shown include unvalidated device data. ? ?Last Pain:  ?Vitals:  ? 04/12/21 0637  ?TempSrc: Oral  ?   ? ?  ? ?Complications: No notable events documented. ?

## 2021-04-13 ENCOUNTER — Encounter (HOSPITAL_BASED_OUTPATIENT_CLINIC_OR_DEPARTMENT_OTHER): Payer: Self-pay | Admitting: Obstetrics and Gynecology

## 2021-04-13 LAB — SURGICAL PATHOLOGY

## 2021-04-26 ENCOUNTER — Other Ambulatory Visit (INDEPENDENT_AMBULATORY_CARE_PROVIDER_SITE_OTHER): Payer: Self-pay | Admitting: Family Medicine

## 2021-04-26 ENCOUNTER — Other Ambulatory Visit (HOSPITAL_COMMUNITY): Payer: Self-pay

## 2021-04-26 ENCOUNTER — Encounter (INDEPENDENT_AMBULATORY_CARE_PROVIDER_SITE_OTHER): Payer: Self-pay | Admitting: Family Medicine

## 2021-04-26 DIAGNOSIS — J302 Other seasonal allergic rhinitis: Secondary | ICD-10-CM

## 2021-04-26 DIAGNOSIS — R7303 Prediabetes: Secondary | ICD-10-CM

## 2021-04-26 MED ORDER — WEGOVY 1.7 MG/0.75ML ~~LOC~~ SOAJ
1.7000 mg | SUBCUTANEOUS | 0 refills | Status: DC
  Filled 2021-04-26: qty 3, 28d supply, fill #0

## 2021-04-26 NOTE — Addendum Note (Signed)
Addended by: Georgianne Fick on: 04/26/2021 02:49 PM ? ? Modules accepted: Orders ? ?

## 2021-04-26 NOTE — Telephone Encounter (Signed)
LAST APPOINTMENT DATE: 03/22/21 ?NEXT APPOINTMENT DATE: 05/03/21 ? ? ?[facility] (New Address) - [place], [place] AT Previously: [address] ?[address] ?[address] ?[place] [address] ?Phone: [phone] Fax: [phone] ? ?[facility] ?[address] ?[place] [address] ?Phone: [phone] Fax: [phone] ? ?[facility] ?[address] ?[place] [address] ?Phone: [phone] Fax: [phone] ? ?Patient is requesting a refill of the following medications: ?No prescriptions requested or ordered in this encounter ? ? ?Date last filled: 03/22/21 ?Previously prescribed by [facility] ? ?Lab Results ?     Component                Value               Date                 ?     HGBA1C                   6.4 (H)             07/28/2020           ?     HGBA1C                   6.4 (H)             03/15/2020           ?Lab Results ?     Component                Value               Date                 ?     LDLCALC                  111 (H)             07/28/2020           ?     CREATININE               0.91                04/10/2021           ?Lab Results ?     Component                Value               Date                 ?     VD25OH                   68.5                07/28/2020           ?     VD25OH                   22.9 (L)            03/15/2020           ? ?BP Readings from Last 3 Encounters: ?04/12/21 : (!) 160/83 ?04/10/21 : 139/88 ?03/24/21 : (!) 148/90 ?

## 2021-05-02 NOTE — Progress Notes (Signed)
?TeleHealth Visit:  ?Due to the COVID-19 pandemic, this visit was completed with telemedicine (audio/video) technology to reduce patient and provider exposure as well as to preserve personal protective equipment.  ? ?Pamela [name] has verbally consented to this TeleHealth visit. The patient is located at home, the provider is located at home. The participants in this visit include the listed provider and patient. The visit was conducted today via MyChart video. ? ?OBESITY ?Pamela [name] is here to discuss her progress with her obesity treatment plan along with follow-up of her obesity related diagnoses.  ? ?Today's visit was # 19 ?Starting weight: 333 lbs ?Starting date: 03/15/20 ?Total weight loss: 63 lbs at last in office visit ?Weight at last in person visit: 270 lbs on 02/22/21 ?Today's reported weight: 259 lbs  ? ? ?Nutrition Plan: keeping a food journal and adhering to recommended goals of 1500-1700 calories and 100 gms protein.  ?Hunger is well controlled. Cravings are poorly controlled.  ?Current exercise: walking- slow speed due to having hysterectomy on March 8. ? ?Interim History: Pamela [name] is recovering very well from her hysterectomy and we will have her postop appointment today.  She hopes to be approved to drive and to do some exercise.  She feels she has not done well at all with her eating recently.  However she reports a weight of 259 pounds today which is 11 pounds down from her last in person visit on February 22, 2021. ?She has not been journaling and she reports a great deal of sweets cravings.  She has had some doughnuts and Butterfingers candy bars recently. ?She will likely be returning to work soon. ? ?Assessment/Plan:  ?Prediabetes ?Pamela [name] has a diagnosis of prediabetes based on her elevated HgA1c. ?She denies polyphagia but is struggling with sweets cravings. ?Medication(s): Wegovy 1.7 mg weekly.  She denies nausea or constipation. ?Lab Results  ?Component Value Date  ? HGBA1C 6.4 (H) 07/28/2020  ? ?Lab  Results  ?Component Value Date  ? INSULIN 15.0 07/28/2020  ? INSULIN 30.0 (H) 03/15/2020  ? ? ?Plan: ?Continue Wegovy at 1.7 mg weekly. ?Check A1c and fasting insulin before visit with Dr. Leafy Ro. ? ?2. Vitamin D Deficiency ?Vitamin D  is at goal of 50. She is on daily OTC Vitamin D 5000 IU.  ?Lab Results  ?Component Value Date  ? VD25OH 68.5 07/28/2020  ? VD25OH 22.9 (L) 03/15/2020  ? ? ?Plan: ?Continue OTC vitamin D 5000 IU daily. ?Check vitamin D. ? ?3. Mixed Hyperlipidemia ?LDL is not at goal. ?Medication(s):none ?Cardiovascular risk factors: dyslipidemia, hypertension, and obesity (BMI >= 30 kg/m2) ? ?Lab Results  ?Component Value Date  ? CHOL 179 07/28/2020  ? HDL 51 07/28/2020  ? LDLCALC 111 (H) 07/28/2020  ? TRIG 90 07/28/2020  ? CHOLHDL 3.5 07/28/2020  ? ?Lab Results  ?Component Value Date  ? ALT 11 07/28/2020  ? AST 14 07/28/2020  ? ALKPHOS 84 07/28/2020  ? BILITOT 0.4 07/28/2020  ? ?The 10-year ASCVD risk score (Arnett DK, et al., 2019) is: 8.8% ?  Values used to calculate the score: ?    Age: 7 years ?    Sex: Female ?    Is Non-Hispanic African American: Yes ?    Diabetic: No ?    Tobacco smoker: No ?    Systolic Blood Pressure: 390 mmHg ?    Is BP treated: Yes ?    HDL Cholesterol: 51 mg/dL ?    Total Cholesterol: 179 mg/dL ? ?Plan: ?Check lipid profile. ?Assess need for  statin after lab check. ? ?4. Emotional eating ?Pamela [name] has been having quite a few cravings recently since her surgery on March 8.  We discussed resuming visits with Dr. Mallie Mussel before her surgery and she was open to this.  She has seen Dr. Mallie Mussel once in the past. ? ?Plan: ?We set up an appointment for her with Dr. Mallie Mussel. ? ?5. Obesity: Current BMI 44.93 ?Pamela [name] is currently in the action stage of change. As such, her goal is to continue with weight loss efforts. She has agreed to keeping a food journal and adhering to recommended goals of 1500-1700 calories and 100 gms of  protein.  ? ?Exercise goals: Resume exercise as allowed by  her gynecologist. ? ?Behavioral modification strategies: decreasing simple carbohydrates, keeping healthy foods in the home, emotional eating strategies, and keeping a strict food journal. ?We discussed the importance of journaling and continue plans to get started back with this today. ? ?Pamela [name] has agreed to follow-up with our clinic in 4 weeks.  ?Labs ordered and Pamela [name] will come to the office to have these drawn before her next visit. ? ?Orders Placed This Encounter  ?Procedures  ? Comprehensive metabolic panel  ? Hemoglobin A1c  ? Insulin, random  ? Lipid Panel With LDL/HDL Ratio  ? VITAMIN D 25 Hydroxy (Vit-D Deficiency, Fractures)  ? ? ?There are no discontinued medications.  ? ?No orders of the defined types were placed in this encounter. ?   ? ?Objective:  ? ?VITALS: Per patient if applicable, see vitals. ?GENERAL: Alert and in no acute distress. ?CARDIOPULMONARY: No increased WOB. Speaking in clear sentences.  ?PSYCH: Pleasant and cooperative. Speech normal rate and rhythm. Affect is appropriate. Insight and judgement are appropriate. Attention is focused, linear, and appropriate.  ?NEURO: Oriented as arrived to appointment on time with no prompting.  ? ?Lab Results  ?Component Value Date  ? CREATININE 0.91 04/10/2021  ? BUN 12 04/10/2021  ? NA 135 04/10/2021  ? K 3.7 04/10/2021  ? CL 105 04/10/2021  ? CO2 24 04/10/2021  ? ?Lab Results  ?Component Value Date  ? ALT 11 07/28/2020  ? AST 14 07/28/2020  ? ALKPHOS 84 07/28/2020  ? BILITOT 0.4 07/28/2020  ? ?Lab Results  ?Component Value Date  ? HGBA1C 6.4 (H) 07/28/2020  ? HGBA1C 6.4 (H) 03/15/2020  ? ?Lab Results  ?Component Value Date  ? INSULIN 15.0 07/28/2020  ? INSULIN 30.0 (H) 03/15/2020  ? ?Lab Results  ?Component Value Date  ? TSH 2.170 03/15/2020  ? ?Lab Results  ?Component Value Date  ? CHOL 179 07/28/2020  ? HDL 51 07/28/2020  ? LDLCALC 111 (H) 07/28/2020  ? TRIG 90 07/28/2020  ? CHOLHDL 3.5 07/28/2020  ? ?Lab Results  ?Component Value Date  ? WBC 4.7  04/10/2021  ? HGB 14.1 04/10/2021  ? HCT 40.8 04/10/2021  ? MCV 88.3 04/10/2021  ? PLT 270 04/10/2021  ? ?Lab Results  ?Component Value Date  ? IRON 63 07/28/2020  ? TIBC 274 07/28/2020  ? FERRITIN 57 07/28/2020  ? ?Lab Results  ?Component Value Date  ? VD25OH 68.5 07/28/2020  ? VD25OH 22.9 (L) 03/15/2020  ? ? ?Attestation Statements:  ? ?Reviewed by clinician on day of visit: allergies, medications, problem list, medical history, surgical history, family history, social history, and previous encounter notes. ? ? ?

## 2021-05-03 ENCOUNTER — Telehealth (INDEPENDENT_AMBULATORY_CARE_PROVIDER_SITE_OTHER): Payer: BC Managed Care – PPO | Admitting: Family Medicine

## 2021-05-03 ENCOUNTER — Encounter (INDEPENDENT_AMBULATORY_CARE_PROVIDER_SITE_OTHER): Payer: Self-pay | Admitting: Family Medicine

## 2021-05-03 DIAGNOSIS — F509 Eating disorder, unspecified: Secondary | ICD-10-CM | POA: Diagnosis not present

## 2021-05-03 DIAGNOSIS — E782 Mixed hyperlipidemia: Secondary | ICD-10-CM

## 2021-05-03 DIAGNOSIS — E669 Obesity, unspecified: Secondary | ICD-10-CM

## 2021-05-03 DIAGNOSIS — E559 Vitamin D deficiency, unspecified: Secondary | ICD-10-CM

## 2021-05-03 DIAGNOSIS — R7303 Prediabetes: Secondary | ICD-10-CM

## 2021-05-03 DIAGNOSIS — Z6841 Body Mass Index (BMI) 40.0 and over, adult: Secondary | ICD-10-CM

## 2021-05-03 NOTE — Progress Notes (Signed)
?Office: [phone]  /  Fax: [phone] ? ? ? ?Date: May 08, 2021    ?Appointment Start Time: 2:30pm ?Duration: 33 minutes ?Provider: Glennie Isle, Psy.D. ?Type of Session: Individual Therapy  ?Location of Patient: Home (private location) ?Location of Provider: Provider's Home (private office) ?Type of Contact: Telepsychological Visit via MyChart Video Visit ? ?Session Content: Pamela [name] is a 51 y.o. female presenting for a follow-up appointment to address the previously established treatment goal of increasing coping skills.Today's appointment was a telepsychological visit due to COVID-19. Pamela [name] provided verbal consent for today's telepsychological appointment and she is aware she is responsible for securing confidentiality on her end of the session. Prior to proceeding with today's appointment, Teruko's physical location at the time of this appointment was obtained as well a phone number she could be reached at in the event of technical difficulties. Pamela [name] and this provider participated in today's telepsychological service.  ? ?This provider conducted a brief check-in. Pamela [name] shared she is "doing okay," adding she is "struggling with self-control" as it relates to her eating habits. She described not feeling understood by her husband. Since the last appointment with this provider, she shared she had a hysterectomy, experienced symptoms of depression during the holidays, and is stepping away from her previous employment role. She continues to experience ADHD-related symptoms, adding she has not had an opportunity to explore evaluation options. Moreover, Pamela [name] described experiencing "anxiety around food." Further explored and processed. Due to her recent surgery and change in employment, she described experiencing a lot of change and feeling out of control. Psychoeducation provided regarding self-compassion. Pamela [name] was engaged in a self-compassion exercise to help with eating-related challenges and other ongoing  stressors. She was encouraged to regularly ask, ?What do I need right now?? Overall, Pamela [name] was receptive to today's appointment as evidenced by openness to sharing, responsiveness to feedback, and willingness to implement discussed strategies . ? ?Mental Status Examination:  ?Appearance: neat ?Behavior: appropriate to circumstances ?Mood: anxious ?Affect: mood congruent ?Speech: WNL ?Eye Contact: appropriate ?Psychomotor Activity: WNL ?Gait: unable to assess ?Thought Process: linear, logical, and goal directed and denies suicidal, homicidal, and self-harm ideation, plan and intent since last appointment with this provider ?Thought Content/Perception: no hallucinations, delusions, bizarre thinking or behavior endorsed or observed ?Orientation: AAOx4 ?Memory/Concentration: memory, attention, language, and fund of knowledge intact  ?Insight: fair ?Judgment: fair ? ?Structured Assessments Results: The Patient Health Questionnaire-9 (PHQ-9) is a self-report measure that assesses symptoms and severity of depression over the course of the last two weeks. Pamela [name] obtained a score of 2 suggesting minimal depression. Pamela [name] finds the endorsed symptoms to be somewhat difficult. ?[0= Not at all; 1= Several days; 2= More than half the days; 3= Nearly every day] ?Little interest or pleasure in doing things 0  ?Feeling down, depressed, or hopeless 0  ?Trouble falling or staying asleep, or sleeping too much 0  ?Feeling tired or having little energy 0  ?Poor appetite or overeating 1  ?Feeling bad about yourself --- or that you are a failure or have let yourself or your family down 1  ?Trouble concentrating on things, such as reading the newspaper or watching television 0  ?Moving or speaking so slowly that other people could have noticed? Or the opposite --- being so fidgety or restless that you have been moving around a lot more than usual 0  ?Thoughts that you would be better off dead or hurting yourself in some way 0  ?PHQ-9 Score 2   ?  ?The Generalized  Anxiety Disorder-7 (GAD-7) is a brief self-report measure that assesses symptoms of anxiety over the course of the last two weeks. Pamela [name] obtained a score of 0. ?[0= Not at all; 1= Several days; 2= Over half the days; 3= Nearly every day] ?Feeling nervous, anxious, on edge 0  ?Not being able to stop or control worrying 0  ?Worrying too much about different things 0  ?Trouble relaxing 0  ?Being so restless that it's hard to sit still 0  ?Becoming easily annoyed or irritable 0  ?Feeling afraid as if something awful might happen 0  ?GAD-7 Score 0  ? ?Interventions:  ?Conducted a brief chart review ?Provided empathic reflections and validation ?Employed supportive psychotherapy interventions to facilitate reduced distress and to improve coping skills with identified stressors ?Psychoeducation provided regarding self-compassion ?Engaged pt in a self-compassion exercise ?Verbally administered PHQ-9 and GAD-7 for symptom monitoring  ? ?DSM-5 Diagnosis(es): F50.89 Other Specified Feeding or Eating Disorder, Emotional Eating Behaviors, F90.9 Unspecified Attention-Deficit/Hyperactivity Disorder , and F41.9 Unspecified Anxiety Disorder ? ?Treatment Goal & Progress: During the initial appointment with this provider, the following treatment goal was established: increase coping skills. Progress is limited, as Pamela [name] has just re-initiated treatment with this provider; however, she is receptive to the interaction and interventions and rapport is being established.  ? ?Plan: The next appointment will be scheduled in two weeks, which will be via MyChart Video Visit. The next session will focus on working towards the established treatment goal. Additionally, Pamela [name] was provided two referral options to explore for an ADHD-evaluation.  ?

## 2021-05-08 ENCOUNTER — Telehealth (INDEPENDENT_AMBULATORY_CARE_PROVIDER_SITE_OTHER): Payer: BC Managed Care – PPO | Admitting: Psychology

## 2021-05-08 DIAGNOSIS — F419 Anxiety disorder, unspecified: Secondary | ICD-10-CM

## 2021-05-08 DIAGNOSIS — F5089 Other specified eating disorder: Secondary | ICD-10-CM

## 2021-05-08 DIAGNOSIS — F909 Attention-deficit hyperactivity disorder, unspecified type: Secondary | ICD-10-CM | POA: Diagnosis not present

## 2021-05-08 NOTE — Progress Notes (Signed)
?  Office: [phone]  /  Fax: [phone] ? ? ? ?Date: 05/22/2021   ?Appointment Start Time: 8:30am ?Duration: 31 minutes ?Provider: Glennie Isle, Psy.D. ?Type of Session: Individual Therapy  ?Location of Patient: Home (private location) ?Location of Provider: Provider's Home (private office) ?Type of Contact: Telepsychological Visit via MyChart Video Visit ? ?Session Content: Pamela [name] is a 51 y.o. female presenting for a follow-up appointment to address the previously established treatment goal of increasing coping skills.Today's appointment was a telepsychological visit due to COVID-19. Pamela [name] provided verbal consent for today's telepsychological appointment and she is aware she is responsible for securing confidentiality on her end of the session. Prior to proceeding with today's appointment, Yulieth's physical location at the time of this appointment was obtained as well a phone number she could be reached at in the event of technical difficulties. Pamela [name] and this provider participated in today's telepsychological service.  ? ?This provider conducted a brief check-in. Pamela [name] shared, "I've been working on the self-compassion project," adding a plan to increase self-care. She disclosed having a "moment" with her father, which brought back trauma-related memories resulting in her "disconnecting" from him. Briefly explored and processed. Pamela [name] described it as being a "good" change. This provider recommended longer-term therapeutic services. Pamela [name] discussed a desire to look for providers on her own. As such, this provider shared about [email or web address]. Regarding eating habits, Pamela [name] reported a desire to resume a structured meal plan to help renew motivation. Further explored and processed. Remainder of today's appointment focused on motivation by building discrepancy. Also discussed how self-compassion can be included in increasing motivation. Overall, Pamela [name] was receptive to today's appointment as evidenced by  openness to sharing, responsiveness to feedback, and willingness to continue engaging in learned skills. ? ?Mental Status Examination:  ?Appearance: neat ?Behavior: appropriate to circumstances ?Mood: neutral ?Affect: mood congruent ?Speech: WNL ?Eye Contact: appropriate ?Psychomotor Activity: WNL ?Gait: unable to assess ?Thought Process: linear, logical, and goal directed and denies suicidal, homicidal, and self-harm ideation, plan and intent  ?Thought Content/Perception: no hallucinations, delusions, bizarre thinking or behavior endorsed or observed ?Orientation: AAOx4 ?Memory/Concentration: memory, attention, language, and fund of knowledge intact  ?Insight: fair ?Judgment: fair ? ?Interventions:  ?Conducted a brief chart review ?Conducted a risk assessment ?Provided empathic reflections and validation ?Reviewed content from the previous session ?Employed supportive psychotherapy interventions to facilitate reduced distress and to improve coping skills with identified stressors ?Employed motivational interviewing skills to assess patient's willingness/desire to adhere to recommended medical treatments and assignments ?Recommended/discussed option for longer-term therapeutic services ? ?DSM-5 Diagnosis(es):  F50.89 Other Specified Feeding or Eating Disorder, Emotional Eating Behaviors, F90.9 Unspecified Attention-Deficit/Hyperactivity Disorder , and F41.9 Unspecified Anxiety Disorder ? ?Treatment Goal & Progress: During the initial appointment with this provider, the following treatment goal was established: increase coping skills. Pamela [name] has demonstrated progress in her goal as evidenced by increased awareness of hunger patterns. Pamela [name] also continues to demonstrate willingness to engage in learned skill(s). ? ?Plan: The next appointment will be scheduled in two weeks, which will be via MyChart Video Visit. The next session will focus on working towards the established treatment goal. Additionally, Pamela [name] reported  she is in the process of completing an evaluation with [employer]. Moreover, Pamela [name] will explore options for traditional therapeutic services.  ? ?

## 2021-05-22 ENCOUNTER — Telehealth (INDEPENDENT_AMBULATORY_CARE_PROVIDER_SITE_OTHER): Payer: BC Managed Care – PPO | Admitting: Psychology

## 2021-05-22 DIAGNOSIS — F909 Attention-deficit hyperactivity disorder, unspecified type: Secondary | ICD-10-CM | POA: Diagnosis not present

## 2021-05-22 DIAGNOSIS — F419 Anxiety disorder, unspecified: Secondary | ICD-10-CM | POA: Diagnosis not present

## 2021-05-22 DIAGNOSIS — F5089 Other specified eating disorder: Secondary | ICD-10-CM | POA: Diagnosis not present

## 2021-05-22 NOTE — Progress Notes (Signed)
?  Office: [phone]  /  Fax: [phone] ? ? ? ?Date: 06/05/2021   ?Appointment Start Time: 2:33pm ?Duration: 22 minutes ?Provider: Glennie Isle, Psy.D. ?Type of Session: Individual Therapy  ?Location of Patient: Work (private location) ?Location of Provider: Provider's Home (private office) ?Type of Contact: Telepsychological Visit via MyChart Video Visit ? ?Session Content: Pamela [name] is a 51 y.o. female presenting for a follow-up appointment to address the previously established treatment goal of increasing coping skills.Today's appointment was a telepsychological visit due to COVID-19. Pamela [name] provided verbal consent for today's telepsychological appointment and she is aware she is responsible for securing confidentiality on her end of the session. Prior to proceeding with today's appointment, Mallorie's physical location at the time of this appointment was obtained as well a phone number she could be reached at in the event of technical difficulties. Pamela [name] and this provider participated in today's telepsychological service.  ? ?This provider conducted a brief check-in. Pamela [name] stated focusing on motivation during the last appointment has been "super helpful," adding she started losing weight again. Positive reinforcement was provided. She further described an increase in self-compassion and noted a reduction in emotional eating behaviors. Notably, she discussed experiencing some anxiety, which in the past has triggered emotional eating behaviors. Thus, psychoeducation was provided regarding grounding techniques to assist with coping. She was led through an exercise (5-4-3-2-1). Her experience was processed. Pamela [name] provided verbal consent during today's appointment for this provider to send a handout with today's exercise via e-mail. Overall, Pamela [name] was receptive to today's appointment as evidenced by openness to sharing, responsiveness to feedback, and  willingness to engage in the grounding exercise as needed . ? ?Mental  Status Examination:  ?Appearance: neat ?Behavior: appropriate to circumstances ?Mood: "anxious" ?Affect: mood congruent ?Speech: WNL ?Eye Contact: appropriate ?Psychomotor Activity: WNL ?Gait: unable to assess ?Thought Process: linear, logical, and goal directed and no evidence or endorsement of suicidal, homicidal, and self-harm ideation, plan and intent  ?Thought Content/Perception: no hallucinations, delusions, bizarre thinking or behavior endorsed or observed ?Orientation: AAOx4 ?Memory/Concentration: memory, attention, language, and fund of knowledge intact  ?Insight: good ?Judgment: good ? ?Interventions:  ?Conducted a brief chart review ?Provided empathic reflections and validation ?Reviewed content from the previous session ?Employed supportive psychotherapy interventions to facilitate reduced distress and to improve coping skills with identified stressors ?Psychoeducation provided regarding grounding techniques ?Engaged pt in a grounding exercise ? ?DSM-5 Diagnosis(es):  F50.89 Other Specified Feeding or Eating Disorder, Emotional Eating Behaviors, F90.9 Unspecified Attention-Deficit/Hyperactivity Disorder , and F41.9 Unspecified Anxiety Disorder ? ?Treatment Goal & Progress: During the initial appointment with this provider, the following treatment goal was established: increase coping skills. Pamela [name] has demonstrated progress in her goal as evidenced by increased awareness of hunger patterns and reduction in emotional eating behaviors . Pamela [name] also continues to demonstrate willingness to engage in learned skill(s). ? ?Plan: Due to Pamela [name] requesting a morning appointment, the next appointment is scheduled for 07/04/2021 at 8:30am, which will be via MyChart Video Visit. The next session will focus on working towards the established treatment goal and possible termination planning. Pamela [name] stated she did not get a chance to look into therapists, but noted a plan to do so before the next appointment with this  provider. ? ?

## 2021-05-25 ENCOUNTER — Other Ambulatory Visit (HOSPITAL_COMMUNITY): Payer: Self-pay

## 2021-05-25 ENCOUNTER — Encounter (INDEPENDENT_AMBULATORY_CARE_PROVIDER_SITE_OTHER): Payer: Self-pay | Admitting: Family Medicine

## 2021-05-25 ENCOUNTER — Other Ambulatory Visit (INDEPENDENT_AMBULATORY_CARE_PROVIDER_SITE_OTHER): Payer: Self-pay | Admitting: Family Medicine

## 2021-05-25 ENCOUNTER — Other Ambulatory Visit (INDEPENDENT_AMBULATORY_CARE_PROVIDER_SITE_OTHER): Payer: Self-pay

## 2021-05-25 DIAGNOSIS — R7303 Prediabetes: Secondary | ICD-10-CM

## 2021-05-25 MED ORDER — WEGOVY 1.7 MG/0.75ML ~~LOC~~ SOAJ
1.7000 mg | SUBCUTANEOUS | 0 refills | Status: DC
  Filled 2021-05-25: qty 3, 28d supply, fill #0

## 2021-05-25 MED ORDER — WEGOVY 2.4 MG/0.75ML ~~LOC~~ SOAJ
2.4000 mg | SUBCUTANEOUS | 0 refills | Status: DC
  Filled 2021-05-25: qty 3, 28d supply, fill #0

## 2021-05-25 NOTE — Telephone Encounter (Signed)
LAST APPOINTMENT DATE: 05/03/21 ?NEXT APPOINTMENT DATE: 05/29/21 ? ? ?[facility] ?[address] ?[place] [address] ?Phone: [phone] Fax: [phone] ? ?[facility] ?[address] ?[place] [address] ?Phone: [phone] Fax: [phone] ? ?Patient is requesting a refill of the following medications: ?Pending Prescriptions:                       Disp   Refills ?  Semaglutide-Weight Management (WEGOVY) 1.7*3 mL   0       ?Sig: Inject 1.7 mg into the skin once a week. ? ? ?Date last filled: 04/26/21 ?Previously prescribed by [facility] ? ?Lab Results ?     Component                Value               Date                 ?     HGBA1C                   6.4 (H)             07/28/2020           ?     HGBA1C                   6.4 (H)             03/15/2020           ?Lab Results ?     Component                Value               Date                 ?     LDLCALC                  111 (H)             07/28/2020           ?     CREATININE               0.91                04/10/2021           ?Lab Results ?     Component                Value               Date                 ?     VD25OH                   68.5                07/28/2020           ?     VD25OH                   22.9 (L)            03/15/2020           ? ?BP Readings from Last 3 Encounters: ?04/12/21 : (!) 160/83 ?04/10/21 : 139/88 ?03/24/21 : (!) 148/90 ?

## 2021-05-25 NOTE — Telephone Encounter (Signed)
Please advise-pharmacy refill request was forwarded to you as well.

## 2021-05-25 NOTE — Telephone Encounter (Signed)
duplicatte ?

## 2021-05-25 NOTE — Telephone Encounter (Signed)
Ok to give 2.4 x 1 month of wegovy

## 2021-05-29 ENCOUNTER — Encounter (INDEPENDENT_AMBULATORY_CARE_PROVIDER_SITE_OTHER): Payer: Self-pay | Admitting: Family Medicine

## 2021-05-29 ENCOUNTER — Ambulatory Visit (INDEPENDENT_AMBULATORY_CARE_PROVIDER_SITE_OTHER): Payer: BC Managed Care – PPO | Admitting: Family Medicine

## 2021-05-29 VITALS — BP 109/65 | HR 69 | Temp 98.0°F | Ht 65.0 in | Wt 258.0 lb

## 2021-05-29 DIAGNOSIS — R4589 Other symptoms and signs involving emotional state: Secondary | ICD-10-CM

## 2021-05-29 DIAGNOSIS — E669 Obesity, unspecified: Secondary | ICD-10-CM | POA: Diagnosis not present

## 2021-05-29 DIAGNOSIS — Z6841 Body Mass Index (BMI) 40.0 and over, adult: Secondary | ICD-10-CM | POA: Diagnosis not present

## 2021-05-29 DIAGNOSIS — I10 Essential (primary) hypertension: Secondary | ICD-10-CM | POA: Diagnosis not present

## 2021-05-30 LAB — COMPREHENSIVE METABOLIC PANEL
ALT: 9 IU/L (ref 0–32)
AST: 15 IU/L (ref 0–40)
Albumin/Globulin Ratio: 1.5 (ref 1.2–2.2)
Albumin: 4.1 g/dL (ref 3.8–4.9)
Alkaline Phosphatase: 73 IU/L (ref 44–121)
BUN/Creatinine Ratio: 13 (ref 9–23)
BUN: 12 mg/dL (ref 6–24)
Bilirubin Total: <0.2 mg/dL (ref 0.0–1.2)
CO2: 21 mmol/L (ref 20–29)
Calcium: 9.2 mg/dL (ref 8.7–10.2)
Chloride: 101 mmol/L (ref 96–106)
Creatinine, Ser: 0.94 mg/dL (ref 0.57–1.00)
Globulin, Total: 2.8 g/dL (ref 1.5–4.5)
Glucose: 84 mg/dL (ref 70–99)
Potassium: 3.9 mmol/L (ref 3.5–5.2)
Sodium: 139 mmol/L (ref 134–144)
Total Protein: 6.9 g/dL (ref 6.0–8.5)
eGFR: 73 mL/min/{1.73_m2} (ref >59)

## 2021-05-30 LAB — HEMOGLOBIN A1C
Est. average glucose Bld gHb Est-mCnc: 111 mg/dL
Hgb A1c MFr Bld: 5.5 % (ref 4.8–5.6)

## 2021-05-30 LAB — LIPID PANEL WITH LDL/HDL RATIO
Cholesterol, Total: 203 mg/dL — ABNORMAL HIGH (ref 100–199)
HDL: 59 mg/dL (ref >39)
LDL Chol Calc (NIH): 125 mg/dL — ABNORMAL HIGH (ref 0–99)
LDL/HDL Ratio: 2.1 ratio (ref 0.0–3.2)
Triglycerides: 108 mg/dL (ref 0–149)
VLDL Cholesterol Cal: 19 mg/dL (ref 5–40)

## 2021-05-30 LAB — VITAMIN D 25 HYDROXY (VIT D DEFICIENCY, FRACTURES): Vit D, 25-Hydroxy: 78.1 ng/mL (ref 30.0–100.0)

## 2021-05-30 LAB — INSULIN, RANDOM: INSULIN: 13.1 u[IU]/mL (ref 2.6–24.9)

## 2021-06-05 ENCOUNTER — Telehealth (INDEPENDENT_AMBULATORY_CARE_PROVIDER_SITE_OTHER): Payer: BC Managed Care – PPO | Admitting: Psychology

## 2021-06-05 DIAGNOSIS — F909 Attention-deficit hyperactivity disorder, unspecified type: Secondary | ICD-10-CM | POA: Diagnosis not present

## 2021-06-05 DIAGNOSIS — F5089 Other specified eating disorder: Secondary | ICD-10-CM

## 2021-06-05 DIAGNOSIS — F419 Anxiety disorder, unspecified: Secondary | ICD-10-CM | POA: Diagnosis not present

## 2021-06-08 NOTE — Progress Notes (Signed)
? ? ? ?Chief Complaint:  ? ?OBESITY ?Pamela [name] is here to discuss her progress with her obesity treatment plan along with follow-up of her obesity related diagnoses. Pamela [name] is on keeping a food journal and adhering to recommended goals of 1500-1700 calories and 100 grams of protein daily and states she is following her eating plan approximately 100% of the time. Pamela [name] states she is doing 0 minutes 0 times per week. ? ?Today's visit was #: 20 ?Starting weight: 333 lbs ?Starting date: 03/15/2020 ?Today's weight: 258 lbs ?Today's date: 05/29/2021 ?Total lbs lost to date: 68 ?Total lbs lost since last in-office visit: 12 ? ?Interim History: Pamela [name] has been on Wegovy and she is tolerating it well. She  has had a lot of challenges over  the last 2 months, but she has already gotten back on track. ? ?Subjective:  ? ?1. Essential hypertension ?Pamela [name]'s blood pressure well controlled with her medications and change in diet and weight loss. She denies signs of hypotension.  ? ?2. Depressed mood, with emotional eating ?Pamela [name] is working on [employer]. She is struggling with some cravings and trying to minimize temptations.  ? ?Assessment/Plan:  ? ?1. Essential hypertension ?Pamela [name] will continue to increase her water intake, and continue her diet and exercise.  ? ?2. Depressed mood, with emotional eating ?Emotional eating behaviors strategies were discussed, and will continue to monitor.  ? ?3. Obesity with current BMI of 43.0 ?Pamela [name] is currently in the action stage of change. As such, her goal is to continue with weight loss efforts. She has agreed to keeping a food journal and adhering to recommended goals of 1500-1700 calories and 100 grams of protein daily.  ? ?Behavioral modification strategies: increasing lean protein intake. ? ?Pamela [name] has agreed to follow-up with our clinic in 3 weeks. She was informed of the importance of frequent follow-up visits to maximize her success with intensive lifestyle  modifications for her multiple health conditions.  ? ?Objective:  ? ?Blood pressure 109/65, pulse 69, temperature 98 ?F (36.7 ?C), height '5\' 5"'$  (1.651 m), weight 258 lb (117 kg), last menstrual period 03/16/2021, SpO2 100 %. ?Body mass index is 42.93 kg/m?. ? ?General: Cooperative, alert, well developed, in no acute distress. ?HEENT: Conjunctivae and lids unremarkable. ?Cardiovascular: Regular rhythm.  ?Lungs: Normal work of breathing. ?Neurologic: No focal deficits.  ? ?Lab Results  ?Component Value Date  ? CREATININE 0.94 05/29/2021  ? BUN 12 05/29/2021  ? NA 139 05/29/2021  ? K 3.9 05/29/2021  ? CL 101 05/29/2021  ? CO2 21 05/29/2021  ? ?Lab Results  ?Component Value Date  ? ALT 9 05/29/2021  ? AST 15 05/29/2021  ? ALKPHOS 73 05/29/2021  ? BILITOT <0.2 05/29/2021  ? ?Lab Results  ?Component Value Date  ? HGBA1C 5.5 05/29/2021  ? HGBA1C 6.4 (H) 07/28/2020  ? HGBA1C 6.4 (H) 03/15/2020  ? ?Lab Results  ?Component Value Date  ? INSULIN 13.1 05/29/2021  ? INSULIN 15.0 07/28/2020  ? INSULIN 30.0 (H) 03/15/2020  ? ?Lab Results  ?Component Value Date  ? TSH 2.170 03/15/2020  ? ?Lab Results  ?Component Value Date  ? CHOL 203 (H) 05/29/2021  ? HDL 59 05/29/2021  ? LDLCALC 125 (H) 05/29/2021  ? TRIG 108 05/29/2021  ? CHOLHDL 3.5 07/28/2020  ? ?Lab Results  ?Component Value Date  ? VD25OH 78.1 05/29/2021  ? VD25OH 68.5 07/28/2020  ? VD25OH 22.9 (L) 03/15/2020  ? ?Lab Results  ?Component Value Date  ? WBC 4.7 04/10/2021  ?  HGB 14.1 04/10/2021  ? HCT 40.8 04/10/2021  ? MCV 88.3 04/10/2021  ? PLT 270 04/10/2021  ? ?Lab Results  ?Component Value Date  ? IRON 63 07/28/2020  ? TIBC 274 07/28/2020  ? FERRITIN 57 07/28/2020  ? ?Attestation Statements:  ? ?Reviewed by clinician on day of visit: allergies, medications, problem list, medical history, surgical history, family history, social history, and previous encounter notes. ? ?Time spent on visit including pre-visit chart review and post-visit care and charting was 30 minutes.   ? ? ?I, Trixie Dredge, am acting as transcriptionist for Dennard Nip, MD. ? ?I have reviewed the above documentation for accuracy and completeness, and I agree with the above. -  Dennard Nip, MD ? ? ?

## 2021-06-13 NOTE — Progress Notes (Signed)
?TeleHealth Visit:  ?This visit was completed with telemedicine (audio/video) technology. ?Pamela [name] has verbally consented to this TeleHealth visit. The patient is located at home, the provider is located at home. The participants in this visit include the listed provider and patient. The visit was conducted today via MyChart video. ? ?OBESITY ?Pamela [name] is here to discuss her progress with her obesity treatment plan along with follow-up of her obesity related diagnoses.  ? ?Today's visit was # 21 ?Starting weight: 333 lbs ?Starting date: 03/15/2020 ?Weight at last in office visit: 258 lbs on 05/29/21 ?Total weight loss: 75 lbs at last in office visit on 05/29/21. ?Today's reported weight: 255 lbs  ? ?Nutrition Plan: keeping a food journal and adhering to recommended goals of 1500-1700 calories and 100 gms protein  ?Hunger is well controlled. Cravings are well controlled.  ?Current exercise:  occasional  walking ? ?Interim History: Pamela [name] continues to do well with weight loss.  She is down a total of 78 pounds since February 2022.  She is very happy with with her success.  She is excited about being able to go on a zip line this summer. ?However she does report that she is not journaling consistently.  She feels that her protein intake is probably not what it should be.   ?Her appetite is well controlled with Wegovy 2.4 mg weekly. ? ?Assessment/Plan:  ?1. Mixed Hyperlipidemia ?LDL is not at goal.  We went over her labs in depth.  She is wondering what she can do to get her LDL below 100.  She has a family history of hyperlipidemia.  She is already limiting saturated fats. ?Medication(s): none ?Patient denies myalgias.  ?Cardiovascular risk factors: dyslipidemia, hypertension, obesity (BMI >= 30 kg/m2), and sedentary lifestyle ? ?Lab Results  ?Component Value Date  ? CHOL 203 (H) 05/29/2021  ? HDL 59 05/29/2021  ? LDLCALC 125 (H) 05/29/2021  ? TRIG 108 05/29/2021  ? CHOLHDL 3.5 07/28/2020  ? ?Lab Results  ?Component  Value Date  ? ALT 9 05/29/2021  ? AST 15 05/29/2021  ? ALKPHOS 73 05/29/2021  ? BILITOT <0.2 05/29/2021  ? ?The 10-year ASCVD risk score (Arnett DK, et al., 2019) is: 8.5% ?  Values used to calculate the score: ?    Age: 51 years ?    Sex: Female ?    Is Non-Hispanic African American: Yes ?    Diabetic: No ?    Tobacco smoker: Yes ?    Systolic Blood Pressure: 967 mmHg ?    Is BP treated: Yes ?    HDL Cholesterol: 59 mg/dL ?    Total Cholesterol: 203 mg/dL ? ?Plan: ?We discussed her ASCVD 10-year risk score.  She would prefer not to start medication at this time.  She would like to work on increasing exercise to see if she can improve her lipid panel without medication. ?She will work towards 30 minutes of cardio exercise 5 days/week.   ?Encouraged her to increase fiber. ?We will reevaluate the need for statin in 6 months. ? ?2. Vitamin D Deficiency ?Vitamin D is at goal of 50. She is on OTC Vitamin D 5000 IU daily.  We discussed her recent vitamin D level. ?Vit D is still trending up. ?Lab Results  ?Component Value Date  ? VD25OH 78.1 05/29/2021  ? VD25OH 68.5 07/28/2020  ? VD25OH 22.9 (L) 03/15/2020  ? ? ?Plan: ?Reduce dose of OTC Vit D to 5000 IU Monday through Friday. ? ? ?Obesity: Current BMI 42.93 ?Pamela [name]  is currently in the action stage of change. As such, her goal is to continue with weight loss efforts.  ?She has agreed to keeping a food journal and adhering to recommended goals of 1500-1700 calories and 100 gms protein .  ? ?Exercise goals: She will work towards 30 minutes of cardio exercise 5 days/week.  For now she will try to take to 30-minute walks per week.   ?Behavioral modification strategies: planning for success and keeping a strict food journal. ? ?Refill Wegovy 2.4 mg weekly. ? ?Pamela [name] has agreed to follow-up with our clinic in 4 weeks.  ? ?No orders of the defined types were placed in this encounter. ? ? ?Medications Discontinued During This Encounter  ?Medication Reason  ? Semaglutide-Weight  Management (WEGOVY) 2.4 MG/0.75ML SOAJ Reorder  ?  ? ?Meds ordered this encounter  ?Medications  ? Cholecalciferol (VITAMIN D) 125 MCG (5000 UT) CAPS  ?  Sig: Take 1 capsule Monday through Friday  ?  Dispense:  30 capsule  ?  Refill:  0  ?  Order Specific Question:   Supervising Provider  ?  Answer:   Dennard Nip D [[id number]]  ? Semaglutide-Weight Management (WEGOVY) 2.4 MG/0.75ML SOAJ  ?  Sig: Inject 2.4 mg into the skin once a week.  ?  Dispense:  3 mL  ?  Refill:  0  ?  Order Specific Question:   Supervising Provider  ?  Answer:   Dennard Nip D [[id number]]  ?   ? ?Objective:  ? ?VITALS: Per patient if applicable, see vitals. ?GENERAL: Alert and in no acute distress. ?CARDIOPULMONARY: No increased WOB. Speaking in clear sentences.  ?PSYCH: Pleasant and cooperative. Speech normal rate and rhythm. Affect is appropriate. Insight and judgement are appropriate. Attention is focused, linear, and appropriate.  ?NEURO: Oriented as arrived to appointment on time with no prompting.  ? ?Lab Results  ?Component Value Date  ? CREATININE 0.94 05/29/2021  ? BUN 12 05/29/2021  ? NA 139 05/29/2021  ? K 3.9 05/29/2021  ? CL 101 05/29/2021  ? CO2 21 05/29/2021  ? ?Lab Results  ?Component Value Date  ? ALT 9 05/29/2021  ? AST 15 05/29/2021  ? ALKPHOS 73 05/29/2021  ? BILITOT <0.2 05/29/2021  ? ?Lab Results  ?Component Value Date  ? HGBA1C 5.5 05/29/2021  ? HGBA1C 6.4 (H) 07/28/2020  ? HGBA1C 6.4 (H) 03/15/2020  ? ?Lab Results  ?Component Value Date  ? INSULIN 13.1 05/29/2021  ? INSULIN 15.0 07/28/2020  ? INSULIN 30.0 (H) 03/15/2020  ? ?Lab Results  ?Component Value Date  ? TSH 2.170 03/15/2020  ? ?Lab Results  ?Component Value Date  ? CHOL 203 (H) 05/29/2021  ? HDL 59 05/29/2021  ? LDLCALC 125 (H) 05/29/2021  ? TRIG 108 05/29/2021  ? CHOLHDL 3.5 07/28/2020  ? ?Lab Results  ?Component Value Date  ? WBC 4.7 04/10/2021  ? HGB 14.1 04/10/2021  ? HCT 40.8 04/10/2021  ? MCV 88.3 04/10/2021  ? PLT 270 04/10/2021  ? ?Lab Results  ?Component  Value Date  ? IRON 63 07/28/2020  ? TIBC 274 07/28/2020  ? FERRITIN 57 07/28/2020  ? ?Lab Results  ?Component Value Date  ? VD25OH 78.1 05/29/2021  ? VD25OH 68.5 07/28/2020  ? VD25OH 22.9 (L) 03/15/2020  ? ? ?Attestation Statements:  ? ?Reviewed by clinician on day of visit: allergies, medications, problem list, medical history, surgical history, family history, social history, and previous encounter notes. ? ? ?

## 2021-06-14 ENCOUNTER — Other Ambulatory Visit (HOSPITAL_COMMUNITY): Payer: Self-pay

## 2021-06-14 ENCOUNTER — Encounter (INDEPENDENT_AMBULATORY_CARE_PROVIDER_SITE_OTHER): Payer: Self-pay | Admitting: Family Medicine

## 2021-06-14 ENCOUNTER — Telehealth (INDEPENDENT_AMBULATORY_CARE_PROVIDER_SITE_OTHER): Payer: BC Managed Care – PPO | Admitting: Family Medicine

## 2021-06-14 DIAGNOSIS — E782 Mixed hyperlipidemia: Secondary | ICD-10-CM | POA: Diagnosis not present

## 2021-06-14 DIAGNOSIS — E669 Obesity, unspecified: Secondary | ICD-10-CM

## 2021-06-14 DIAGNOSIS — E559 Vitamin D deficiency, unspecified: Secondary | ICD-10-CM | POA: Diagnosis not present

## 2021-06-14 DIAGNOSIS — Z6841 Body Mass Index (BMI) 40.0 and over, adult: Secondary | ICD-10-CM

## 2021-06-14 MED ORDER — VITAMIN D 125 MCG (5000 UT) PO CAPS: ORAL_CAPSULE | ORAL | 0 refills | Status: DC

## 2021-06-14 MED ORDER — WEGOVY 2.4 MG/0.75ML ~~LOC~~ SOAJ
2.4000 mg | SUBCUTANEOUS | 0 refills | Status: DC
  Filled 2021-06-14: qty 3, 28d supply, fill #0

## 2021-06-15 ENCOUNTER — Other Ambulatory Visit (HOSPITAL_COMMUNITY): Payer: Self-pay

## 2021-06-20 DIAGNOSIS — R7303 Prediabetes: Secondary | ICD-10-CM

## 2021-06-20 DIAGNOSIS — N939 Abnormal uterine and vaginal bleeding, unspecified: Secondary | ICD-10-CM

## 2021-06-20 DIAGNOSIS — Z01818 Encounter for other preprocedural examination: Secondary | ICD-10-CM

## 2021-06-20 NOTE — Progress Notes (Signed)
Office: [phone]  /  Fax: [phone]    Date: 07/04/2021   Appointment Start Time: 8:31am Duration: 31 minutes Provider: Glennie Isle, Psy.D. Type of Session: Individual Therapy  Location of Patient: Home (private location) Location of Provider: Provider's Home (private office) Type of Contact: Telepsychological Visit via MyChart Video Visit  Session Content: Pamela [name] is a 51 y.o. female presenting for a follow-up appointment to address the previously established treatment goal of increasing coping skills.Today's appointment was a telepsychological visit due to COVID-19. Pamela [name] provided verbal consent for today's telepsychological appointment and she is aware she is responsible for securing confidentiality on her end of the session. Prior to proceeding with today's appointment, Tahjanae's physical location at the time of this appointment was obtained as well a phone number she could be reached at in the event of technical difficulties. Pamela [name] and this provider participated in today's telepsychological service.   Per [employer]'s new policy, Pamela [name] will be e-mailed two additional forms (AOB and Receipt of NPP) to sign as well as [place] [employer]'s Notice of Scientist, research (physical sciences). The content of the documents were explained to Pamela [name] at the onset of the appointment, and she agreed to proceed. Additionally, she agreed to complete the forms and return them prior to the next appointment with this provider.   This provider conducted a brief check-in. Pamela [name] shared eating has "been going really well." She discussed making better choices and engaging in portion control. Pamela [name] shared an instance of enjoying a dessert recently, noting it did "not trigger" her to eat more sweets. Pamela [name] added she has "learned a new relationship with food." Positive reinforcement was provided. Session focused further on self-compassion, specifically the three elements (self-kindness, common humanity, and mindfulness). This provider and Pamela [name] also  discussed thought challenging. Engaged her in an exercise. Furthermore, termination planning was discussed. Pamela [name] was receptive to a follow-up appointment in 3-4 weeks and an additional follow-up/termination appointment in 3-4 weeks after that. Pamela [name] stated she is on the waiting list for a therapist she felt would be a good fit. Overall, Pamela [name] was receptive to today's appointment as evidenced by openness to sharing, responsiveness to feedback, and willingness to implement discussed strategies .  Mental Status Examination:  Appearance: neat Behavior: appropriate to circumstances Mood: neutral Affect: mood congruent Speech: WNL Eye Contact: appropriate Psychomotor Activity: WNL Gait: unable to assess Thought Process: linear, logical, and goal directed and no evidence or endorsement of suicidal, homicidal, and self-harm ideation, plan and intent  Thought Content/Perception: no hallucinations, delusions, bizarre thinking or behavior endorsed or observed Orientation: AAOx4 Memory/Concentration: memory, attention, language, and fund of knowledge intact  Insight: good Judgment: good  Interventions:  Conducted a brief chart review Provided empathic reflections and validation Employed supportive psychotherapy interventions to facilitate reduced distress and to improve coping skills with identified stressors Engaged patient in thought challenging/cognitive restructuring  Discussed termination planning Psychoeducation provided regarding self-compassion  DSM-5 Diagnosis(es):  F50.89 Other Specified Feeding or Eating Disorder, Emotional Eating Behaviors, F90.9 Unspecified Attention-Deficit/Hyperactivity Disorder , and F41.9 Unspecified Anxiety Disorder  Treatment Goal & Progress: During the initial appointment with this provider, the following treatment goal was established: increase coping skills. Pamela [name] has demonstrated progress in her goal as evidenced by increased awareness of hunger patterns and  reduction in emotional eating behaviors . Pamela [name] also continues to demonstrate willingness to engage in learned skill(s).  Plan: The next appointment is scheduled for 08/01/2021 at 8:30am, which will be via MyChart Video Visit. The next session will focus on working towards the established  treatment goal.

## 2021-06-21 ENCOUNTER — Other Ambulatory Visit (HOSPITAL_COMMUNITY): Payer: Self-pay

## 2021-07-04 ENCOUNTER — Telehealth (INDEPENDENT_AMBULATORY_CARE_PROVIDER_SITE_OTHER): Payer: BC Managed Care – PPO | Admitting: Psychology

## 2021-07-04 DIAGNOSIS — F5089 Other specified eating disorder: Secondary | ICD-10-CM | POA: Diagnosis not present

## 2021-07-04 DIAGNOSIS — F909 Attention-deficit hyperactivity disorder, unspecified type: Secondary | ICD-10-CM

## 2021-07-04 DIAGNOSIS — F419 Anxiety disorder, unspecified: Secondary | ICD-10-CM

## 2021-07-12 ENCOUNTER — Ambulatory Visit (INDEPENDENT_AMBULATORY_CARE_PROVIDER_SITE_OTHER): Payer: BC Managed Care – PPO | Admitting: Family Medicine

## 2021-07-12 NOTE — Progress Notes (Signed)
Opened in error.  Patient did not join the virtual visit.

## 2021-07-13 ENCOUNTER — Telehealth (INDEPENDENT_AMBULATORY_CARE_PROVIDER_SITE_OTHER): Payer: Self-pay | Admitting: Family Medicine

## 2021-07-13 ENCOUNTER — Telehealth (INDEPENDENT_AMBULATORY_CARE_PROVIDER_SITE_OTHER): Payer: Self-pay

## 2021-07-13 VITALS — Ht 65.0 in | Wt 254.0 lb

## 2021-07-13 NOTE — Telephone Encounter (Signed)
Opened in error

## 2021-07-18 ENCOUNTER — Other Ambulatory Visit (INDEPENDENT_AMBULATORY_CARE_PROVIDER_SITE_OTHER): Payer: Self-pay | Admitting: Family Medicine

## 2021-07-18 ENCOUNTER — Encounter (INDEPENDENT_AMBULATORY_CARE_PROVIDER_SITE_OTHER): Payer: Self-pay | Admitting: Family Medicine

## 2021-07-18 NOTE — Progress Notes (Unsigned)
  Office: [phone]  /  Fax: [phone]    Date: 08/01/2021   Appointment Start Time: *** Duration: *** minutes Provider: Glennie Isle, Psy.D. Type of Session: Individual Therapy  Location of Patient: {gbptloc:23249} (private location) Location of Provider: Provider's Home (private office) Type of Contact: Telepsychological Visit via MyChart Video Visit  Session Content: Pamela [name] is a 51 y.o. female presenting for a follow-up appointment to address the previously established treatment goal of increasing coping skills.Today's appointment was a telepsychological visit. Pamela [name] provided verbal consent for today's telepsychological appointment and she is aware she is responsible for securing confidentiality on her end of the session. Prior to proceeding with today's appointment, [name]'s physical location at the time of this appointment was obtained as well a phone number she could be reached at in the event of technical difficulties. Pamela [name] and this provider participated in today's telepsychological service.   This provider conducted a brief check-in. *** Pamela [name] was receptive to today's appointment as evidenced by openness to sharing, responsiveness to feedback, and {gbreceptiveness:23401}.  Mental Status Examination:  Appearance: {Appearance:22431} Behavior: {Behavior:22445} Mood: {gbmood:21757} Affect: {Affect:22436} Speech: {Speech:22432} Eye Contact: {Eye Contact:22433} Psychomotor Activity: {Motor Activity:22434} Gait: {gbgait:23404} Thought Process: {thought process:22448}  Thought Content/Perception: {disturbances:22451} Orientation: {Orientation:22437} Memory/Concentration: {gbcognition:22449} Insight: {Insight:22446} Judgment: {Insight:22446}  Interventions:  {Interventions for Progress Notes:23405}  DSM-5 Diagnosis(es):  F50.89 Other Specified Feeding or Eating Disorder, Emotional Eating Behaviors, F90.9 Unspecified Attention-Deficit/Hyperactivity Disorder , and F41.9  Unspecified Anxiety Disorder  Treatment Goal & Progress: During the initial appointment with this provider, the following treatment goal was established: increase coping skills. Pamela [name] has demonstrated progress in her goal as evidenced by {gbtxprogress:22839}. Pamela [name] also {gbtxprogress2:22951}.  Plan: The next appointment is scheduled for *** at ***, which will be via MyChart Video Visit. The next session will focus on {Plan for Next Appointment:23400}.

## 2021-07-19 ENCOUNTER — Other Ambulatory Visit (HOSPITAL_COMMUNITY): Payer: Self-pay

## 2021-07-19 NOTE — Telephone Encounter (Signed)
Appointment request

## 2021-07-20 ENCOUNTER — Other Ambulatory Visit (HOSPITAL_COMMUNITY): Payer: Self-pay

## 2021-07-20 ENCOUNTER — Ambulatory Visit (INDEPENDENT_AMBULATORY_CARE_PROVIDER_SITE_OTHER): Payer: BC Managed Care – PPO | Admitting: Family Medicine

## 2021-07-20 ENCOUNTER — Encounter (INDEPENDENT_AMBULATORY_CARE_PROVIDER_SITE_OTHER): Payer: Self-pay | Admitting: Family Medicine

## 2021-07-20 VITALS — BP 107/73 | HR 60 | Temp 98.3°F | Ht 65.0 in | Wt 253.0 lb

## 2021-07-20 DIAGNOSIS — E669 Obesity, unspecified: Secondary | ICD-10-CM

## 2021-07-20 DIAGNOSIS — I10 Essential (primary) hypertension: Secondary | ICD-10-CM

## 2021-07-20 DIAGNOSIS — Z6841 Body Mass Index (BMI) 40.0 and over, adult: Secondary | ICD-10-CM | POA: Diagnosis not present

## 2021-07-20 MED ORDER — WEGOVY 2.4 MG/0.75ML ~~LOC~~ SOAJ
2.4000 mg | SUBCUTANEOUS | 0 refills | Status: DC
  Filled 2021-07-20: qty 3, 28d supply, fill #0

## 2021-07-20 NOTE — Telephone Encounter (Signed)
Pt has been r/s  

## 2021-07-21 ENCOUNTER — Other Ambulatory Visit (HOSPITAL_COMMUNITY): Payer: Self-pay

## 2021-07-21 MED ORDER — DICLOXACILLIN SODIUM 250 MG PO CAPS
250.0000 mg | ORAL_CAPSULE | Freq: Two times a day (BID) | ORAL | 0 refills | Status: DC
  Filled 2021-07-21: qty 10, 5d supply, fill #0

## 2021-07-24 ENCOUNTER — Other Ambulatory Visit (HOSPITAL_COMMUNITY): Payer: Self-pay

## 2021-07-24 NOTE — Progress Notes (Signed)
Chief Complaint:   OBESITY Pamela [name] is here to discuss her progress with her obesity treatment plan along with follow-up of her obesity related diagnoses. Pamela [name] is on keeping a food journal and adhering to recommended goals of 1500-1700 calories and 100 grams of protein daily and states she is following her eating plan approximately 70% of the time. Pamela [name] states she is doing 0 minutes 0 times per week.  Today's visit was #: 22 Starting weight: 333 lbs Starting date: 03/15/2020 Today's weight: 253 lbs Today's date: 07/20/2021 Total lbs lost to date: 80 Total lbs lost since last in-office visit: 5  Interim History: Pamela [name] continues to do well with weight loss.  Her hunger is mostly controlled.  She will be traveling soon, and she has questions about how to eat healthier.  She also has questions about a breakfast smoothie, and effects of EtOH on weight loss.  Subjective:   1. Essential hypertension Pamela [name]'s blood pressure is well controlled on her medications, and with diet and weight loss.  No side effects were noted.  Assessment/Plan:   1. Essential hypertension Cannula will continue with her diet, exercise, and medications.  We will follow-up at her next visit.  2. Obesity, Current BMI 42.2 Pamela [name] is currently in the action stage of change. As such, her goal is to continue with weight loss efforts. She has agreed to the Category 3 Plan or keeping a food journal and adhering to recommended goals of 1500 calories and 100+ grams of protein daily.   "Real food" protein smoothie recipes were given.  All questions were consumed in depth.  We discussed various medication options to help Pamela [name] with her weight loss efforts and we both agreed to continue Wegovy 2.4 mg weekly, and we will refill for 1 month.  - Semaglutide-Weight Management (WEGOVY) 2.4 MG/0.75ML SOAJ; Inject 2.4 mg into the skin once a week.  Dispense: 3 mL; Refill: 0  Behavioral modification strategies: travel eating  strategies.  Pamela [name] has agreed to follow-up with our clinic in 2 weeks. She was informed of the importance of frequent follow-up visits to maximize her success with intensive lifestyle modifications for her multiple health conditions.   Objective:   Blood pressure 107/73, pulse 60, temperature 98.3 F (36.8 C), height '5\' 5"'$  (1.651 m), weight 253 lb (114.8 kg), last menstrual period 03/16/2021, SpO2 100 %. Body mass index is 42.1 kg/m.  General: Cooperative, alert, well developed, in no acute distress. HEENT: Conjunctivae and lids unremarkable. Cardiovascular: Regular rhythm.  Lungs: Normal work of breathing. Neurologic: No focal deficits.   Lab Results  Component Value Date   CREATININE 0.94 05/29/2021   BUN 12 05/29/2021   NA 139 05/29/2021   K 3.9 05/29/2021   CL 101 05/29/2021   CO2 21 05/29/2021   Lab Results  Component Value Date   ALT 9 05/29/2021   AST 15 05/29/2021   ALKPHOS 73 05/29/2021   BILITOT <0.2 05/29/2021   Lab Results  Component Value Date   HGBA1C 5.5 05/29/2021   HGBA1C 6.4 (H) 07/28/2020   HGBA1C 6.4 (H) 03/15/2020   Lab Results  Component Value Date   INSULIN 13.1 05/29/2021   INSULIN 15.0 07/28/2020   INSULIN 30.0 (H) 03/15/2020   Lab Results  Component Value Date   TSH 2.170 03/15/2020   Lab Results  Component Value Date   CHOL 203 (H) 05/29/2021   HDL 59 05/29/2021   LDLCALC 125 (H) 05/29/2021   TRIG 108 05/29/2021   CHOLHDL  3.5 07/28/2020   Lab Results  Component Value Date   VD25OH 78.1 05/29/2021   VD25OH 68.5 07/28/2020   VD25OH 22.9 (L) 03/15/2020   Lab Results  Component Value Date   WBC 4.7 04/10/2021   HGB 14.1 04/10/2021   HCT 40.8 04/10/2021   MCV 88.3 04/10/2021   PLT 270 04/10/2021   Lab Results  Component Value Date   IRON 63 07/28/2020   TIBC 274 07/28/2020   FERRITIN 57 07/28/2020   Attestation Statements:   Reviewed by clinician on day of visit: allergies, medications, problem list, medical history,  surgical history, family history, social history, and previous encounter notes.  Time spent on visit including pre-visit chart review and post-visit care and charting was 40 minutes.   I, Trixie Dredge, am acting as transcriptionist for Dennard Nip, MD.  I have reviewed the above documentation for accuracy and completeness, and I agree with the above. -  Dennard Nip, MD

## 2021-07-29 ENCOUNTER — Other Ambulatory Visit (INDEPENDENT_AMBULATORY_CARE_PROVIDER_SITE_OTHER): Payer: Self-pay | Admitting: Family Medicine

## 2021-07-29 ENCOUNTER — Other Ambulatory Visit (HOSPITAL_COMMUNITY): Payer: Self-pay

## 2021-07-29 DIAGNOSIS — J302 Other seasonal allergic rhinitis: Secondary | ICD-10-CM

## 2021-07-30 IMAGING — MG MM DIGITAL SCREENING BILAT W/ TOMO AND CAD
6 of 12 series · 6 of 36 positions shown · non-contrast
Comparison: Previous exam(s).

ACR Breast Density Category a: The breast tissue is almost entirely
fatty.

CLINICAL DATA: Screening.

EXAM:
DIGITAL SCREENING BILATERAL MAMMOGRAM WITH TOMOSYNTHESIS AND CAD
TECHNIQUE: Bilateral screening digital craniocaudal and mediolateral oblique
mammograms were obtained. Bilateral screening digital breast
tomosynthesis was performed. The images were evaluated with
computer-aided detection.

[L CC synth-2D (1 of 2)]
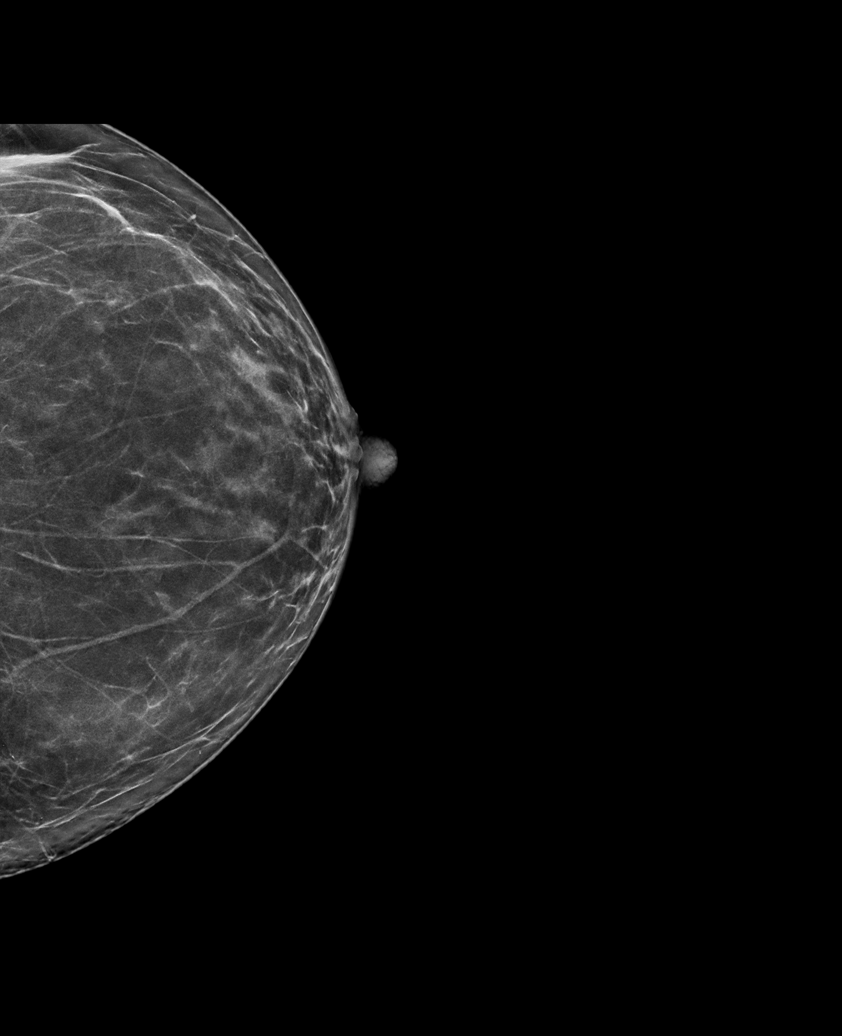

[L MLO synth-2D]
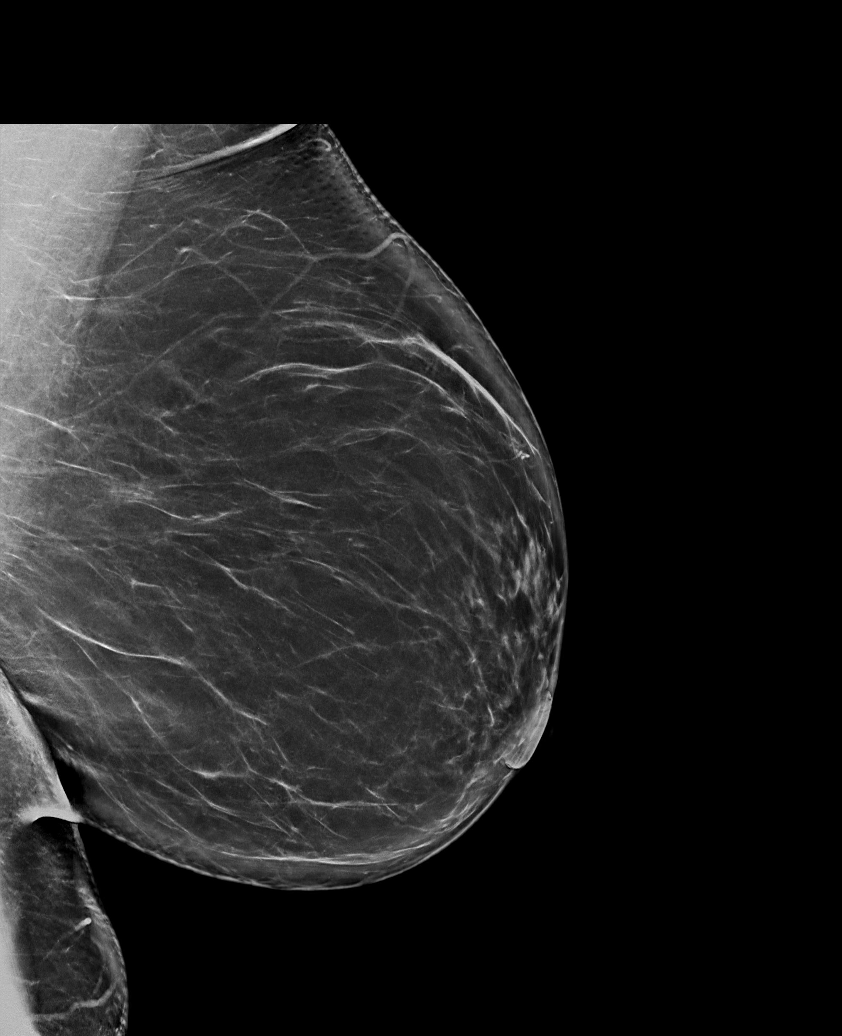

[R CC synth-2D (1 of 2)]
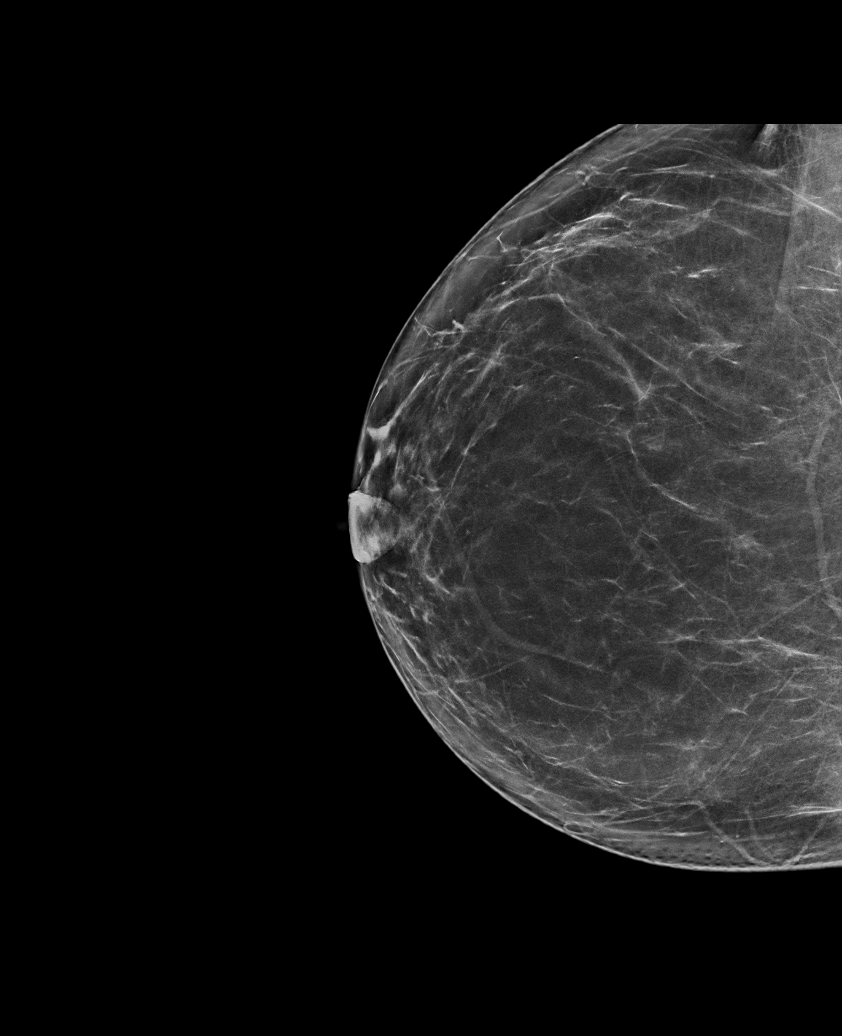

[R CC synth-2D (2 of 2)]
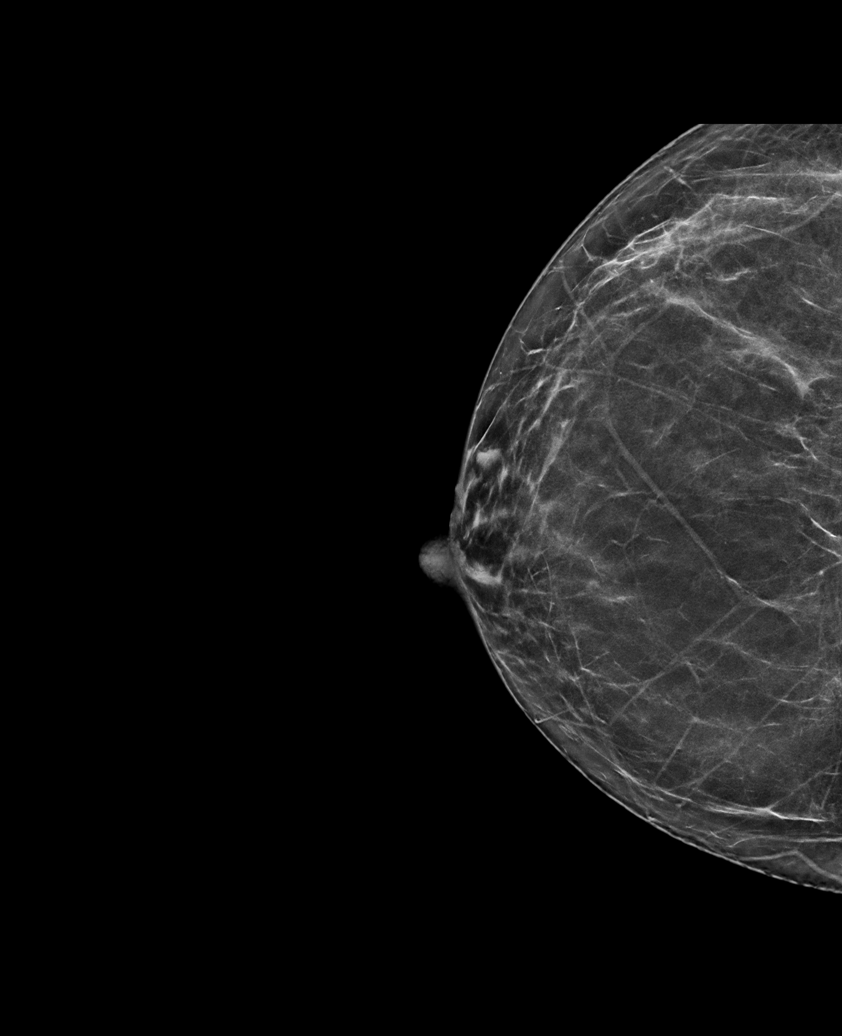

[L CC synth-2D (2 of 2)]
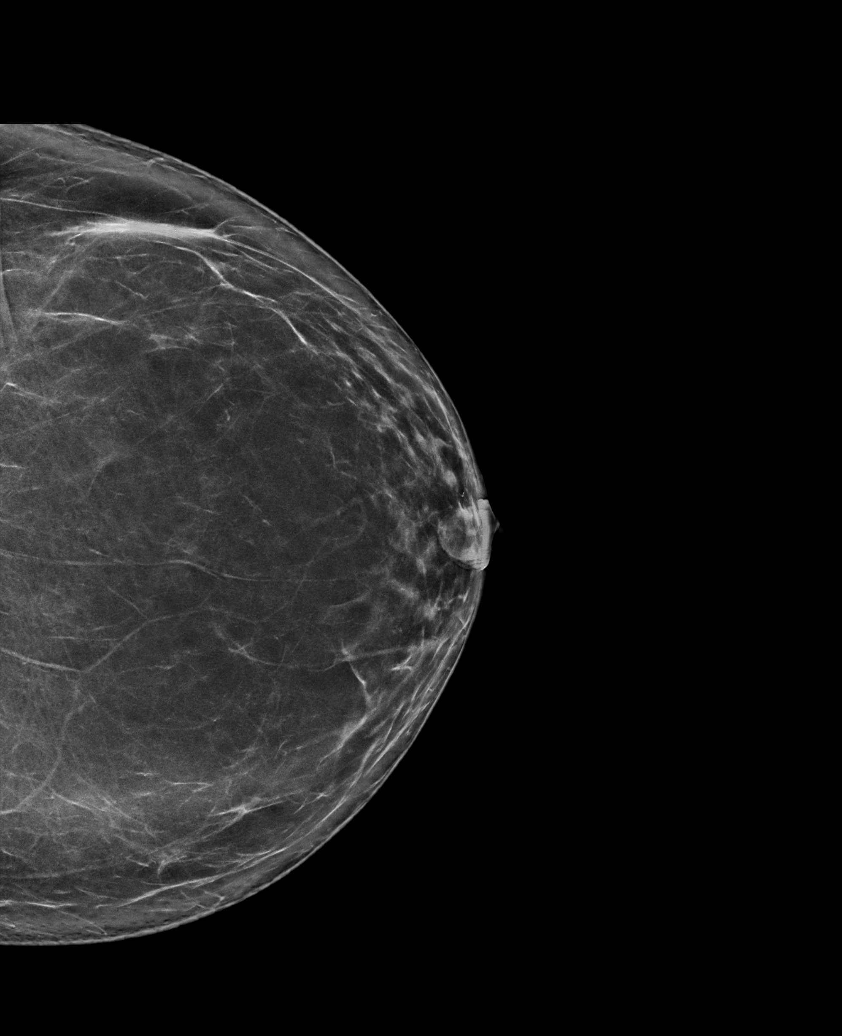

[R MLO synth-2D]
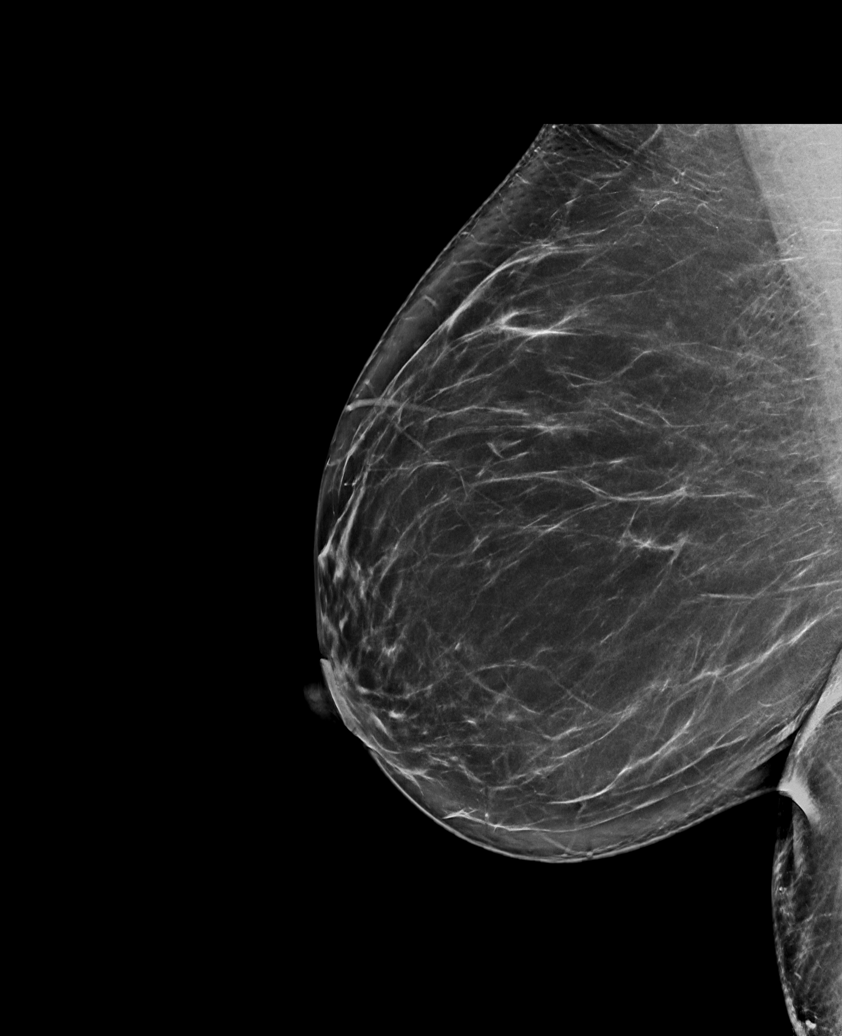

[6 of 36 positions shown; findings below may reference images not displayed]

FINDINGS: There are no findings suspicious for malignancy. The images were
evaluated with computer-aided detection.
IMPRESSION: No mammographic evidence of malignancy. A result letter of this
screening mammogram will be mailed directly to the patient.

RECOMMENDATION:
Screening mammogram in one year. (Code:JP-J-DD5)

BI-RADS CATEGORY  1: Negative.

## 2021-07-31 ENCOUNTER — Other Ambulatory Visit (INDEPENDENT_AMBULATORY_CARE_PROVIDER_SITE_OTHER): Payer: Self-pay

## 2021-07-31 ENCOUNTER — Other Ambulatory Visit (HOSPITAL_COMMUNITY): Payer: Self-pay

## 2021-07-31 ENCOUNTER — Telehealth (INDEPENDENT_AMBULATORY_CARE_PROVIDER_SITE_OTHER): Payer: BC Managed Care – PPO | Admitting: Family Medicine

## 2021-07-31 MED ORDER — LOSARTAN POTASSIUM-HCTZ 100-25 MG PO TABS
1.0000 | ORAL_TABLET | Freq: Every day | ORAL | 3 refills | Status: DC
  Filled 2021-07-31: qty 90, 90d supply, fill #0
  Filled 2021-11-05: qty 90, 90d supply, fill #1

## 2021-08-01 ENCOUNTER — Telehealth (INDEPENDENT_AMBULATORY_CARE_PROVIDER_SITE_OTHER): Payer: BC Managed Care – PPO | Admitting: Psychology

## 2021-08-01 ENCOUNTER — Other Ambulatory Visit (HOSPITAL_COMMUNITY): Payer: Self-pay

## 2021-08-01 DIAGNOSIS — F909 Attention-deficit hyperactivity disorder, unspecified type: Secondary | ICD-10-CM

## 2021-08-01 DIAGNOSIS — F5089 Other specified eating disorder: Secondary | ICD-10-CM

## 2021-08-01 DIAGNOSIS — F419 Anxiety disorder, unspecified: Secondary | ICD-10-CM

## 2021-08-02 ENCOUNTER — Ambulatory Visit (INDEPENDENT_AMBULATORY_CARE_PROVIDER_SITE_OTHER): Payer: BC Managed Care – PPO | Admitting: Family Medicine

## 2021-08-02 ENCOUNTER — Other Ambulatory Visit (HOSPITAL_COMMUNITY): Payer: Self-pay

## 2021-08-02 ENCOUNTER — Encounter (INDEPENDENT_AMBULATORY_CARE_PROVIDER_SITE_OTHER): Payer: Self-pay | Admitting: Family Medicine

## 2021-08-02 VITALS — BP 105/73 | HR 64 | Temp 98.0°F | Ht 65.0 in | Wt 252.0 lb

## 2021-08-02 DIAGNOSIS — E559 Vitamin D deficiency, unspecified: Secondary | ICD-10-CM | POA: Diagnosis not present

## 2021-08-02 DIAGNOSIS — E669 Obesity, unspecified: Secondary | ICD-10-CM

## 2021-08-02 DIAGNOSIS — Z6841 Body Mass Index (BMI) 40.0 and over, adult: Secondary | ICD-10-CM | POA: Diagnosis not present

## 2021-08-02 MED ORDER — WEGOVY 2.4 MG/0.75ML ~~LOC~~ SOAJ
2.4000 mg | SUBCUTANEOUS | 0 refills | Status: DC
  Filled 2021-08-02 – 2021-08-17 (×2): qty 3, 28d supply, fill #0

## 2021-08-03 NOTE — Progress Notes (Signed)
Chief Complaint:   OBESITY Pamela [name] is here to discuss her progress with her obesity treatment plan along with follow-up of her obesity related diagnoses. Pamela [name] is on Category 3 Plan or keeping a food journal and adhering to recommended goals of 1500 calories and 100+ grams of protein daily.and states she is following her eating plan approximately 80-85% of the time. Pamela [name] states she is not exercising.  Today's visit was #: 23 Starting weight: 333 lbs Starting date: 03/15/2020 Today's weight: 252 lbs Today's date: 08/02/2021 Total lbs lost to date: 81 lbs Total lbs lost since last in-office visit: 1 lbs  Interim History: Pamela [name] is going to [place], [place] tomorrow. Causing her angst.  The hotel has a mini fridge.  Needs to discuss strategies for success.  Subjective:   1. Vitamin D deficiency She is currently taking 5,000 IU daily, but only Monday through Friday. She denies nausea, vomiting or muscle weakness.   Assessment/Plan:   Medications Discontinued During This Encounter  Medication Reason   Semaglutide-Weight Management (WEGOVY) 2.4 MG/0.75ML SOAJ Reorder     Meds ordered this encounter  Medications   Semaglutide-Weight Management (WEGOVY) 2.4 MG/0.75ML SOAJ    Sig: Inject 2.4 mg into the skin once a week.    Dispense:  3 mL    Refill:  0     1. Vitamin D deficiency Low Vitamin D level contributes to fatigue and are associated with obesity, breast, and colon cancer. She agrees to continue to take prescription Vitamin D '@50'$ ,000 IU every week and will follow-up for routine testing of Vitamin D, at least 2-3 times per year to avoid over-replacement.   2. Obesity, Current BMI 42.1 No change in dose, denies any hunger or cravings, she is tolerating well.  Refill- Semaglutide-Weight Management (WEGOVY) 2.4 MG/0.75ML SOAJ; Inject 2.4 mg into the skin once a week.  Dispense: 3 mL; Refill: 0  Pamela [name] is currently in the action stage of change. As such, her goal is to  continue with weight loss efforts. She has agreed to Category 3  or keeping a food journal and adhering to recommended goals of 1500 calories and 100+ protein daily.  Exercise goals: All adults should avoid inactivity. Some physical activity is better than none, and adults who participate in any amount of physical activity gain some health benefits.  Behavioral modification strategies: dealing with coworker sabotage and travel eating strategies.  Pamela [name] has agreed to follow-up with our clinic in 3 weeks. She was informed of the importance of frequent follow-up visits to maximize her success with intensive lifestyle modifications for her multiple health conditions.   Objective:   Blood pressure 105/73, pulse 64, temperature 98 F (36.7 C), height '5\' 5"'$  (1.651 m), weight 252 lb (114.3 kg), last menstrual period 03/16/2021, SpO2 100 %. Body mass index is 41.93 kg/m.  General: Cooperative, alert, well developed, in no acute distress. HEENT: Conjunctivae and lids unremarkable. Cardiovascular: Regular rhythm.  Lungs: Normal work of breathing. Neurologic: No focal deficits.   Lab Results  Component Value Date   CREATININE 0.94 05/29/2021   BUN 12 05/29/2021   NA 139 05/29/2021   K 3.9 05/29/2021   CL 101 05/29/2021   CO2 21 05/29/2021   Lab Results  Component Value Date   ALT 9 05/29/2021   AST 15 05/29/2021   ALKPHOS 73 05/29/2021   BILITOT <0.2 05/29/2021   Lab Results  Component Value Date   HGBA1C 5.5 05/29/2021   HGBA1C 6.4 (H) 07/28/2020  HGBA1C 6.4 (H) 03/15/2020   Lab Results  Component Value Date   INSULIN 13.1 05/29/2021   INSULIN 15.0 07/28/2020   INSULIN 30.0 (H) 03/15/2020   Lab Results  Component Value Date   TSH 2.170 03/15/2020   Lab Results  Component Value Date   CHOL 203 (H) 05/29/2021   HDL 59 05/29/2021   LDLCALC 125 (H) 05/29/2021   TRIG 108 05/29/2021   CHOLHDL 3.5 07/28/2020   Lab Results  Component Value Date   VD25OH 78.1 05/29/2021    VD25OH 68.5 07/28/2020   VD25OH 22.9 (L) 03/15/2020   Lab Results  Component Value Date   WBC 4.7 04/10/2021   HGB 14.1 04/10/2021   HCT 40.8 04/10/2021   MCV 88.3 04/10/2021   PLT 270 04/10/2021   Lab Results  Component Value Date   IRON 63 07/28/2020   TIBC 274 07/28/2020   FERRITIN 57 07/28/2020    Obesity Behavioral Intervention:   Approximately 15 minutes were spent on the discussion below.  ASK: We discussed the diagnosis of obesity with Pamela [name] today and Pamela [name] agreed to give [place] permission to discuss obesity behavioral modification therapy today.  ASSESS: Pamela [name] has the diagnosis of obesity and her Body mass index is 41.93 kg/m today. Pamela [name] is in the action stage of change.   ADVISE: Pamela [name] was educated on the multiple health risks of obesity as well as the benefit of weight loss to improve her health. She was advised of the need for long term treatment and the importance of lifestyle modifications to improve her current health and to decrease her risk of future health problems.  AGREE: Multiple dietary modification options and treatment options were discussed and Pamela [name] agreed to follow the recommendations documented in the above note.  ARRANGE: Pamela [name] was educated on the importance of frequent visits to treat obesity as outlined per CMS and USPSTF guidelines and agreed to schedule her next follow up appointment today.  Attestation Statements:   Reviewed by clinician on day of visit: allergies, medications, problem list, medical history, surgical history, family history, social history, and previous encounter notes.

## 2021-08-11 ENCOUNTER — Other Ambulatory Visit (HOSPITAL_COMMUNITY): Payer: Self-pay

## 2021-08-11 ENCOUNTER — Other Ambulatory Visit (INDEPENDENT_AMBULATORY_CARE_PROVIDER_SITE_OTHER): Payer: Self-pay

## 2021-08-15 NOTE — Progress Notes (Signed)
  Office: [phone]  /  Fax: [phone]    Date: 08/29/2021   Appointment Start Time: 8:01am Duration: 23 minutes Provider: Glennie Isle, Psy.D. Type of Session: Individual Therapy  Location of Patient: Work (private location) Location of Provider: Provider's Home (private office) Type of Contact: Telepsychological Visit via MyChart Video Visit  Session Content: Pamela [name] is a 51 y.o. female presenting for a follow-up appointment to address the previously established treatment goal of increasing coping skills.Today's appointment was a telepsychological visit. Pamela [name] provided verbal consent for today's telepsychological appointment and she is aware she is responsible for securing confidentiality on her end of the session. Prior to proceeding with today's appointment, Pamela [name]'s physical location at the time of this appointment was obtained as well a phone number she could be reached at in the event of technical difficulties. Pamela [name] and this provider participated in today's telepsychological service.   This provider conducted a brief check-in. 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 Additionally, Pamela [name] reflected on progress to date and described feeling more in "control." She noted focusing on self-compassion has helped. As such, today's appointment focused further on self-compassion. She was engaged in a self-compassion exercise (self-hug). Benefits were discussed. Overall, Pamela [name] was receptive to today's appointment as evidenced by openness to sharing, responsiveness to feedback, and willingness to continue engaging in learned skills.  Mental Status Examination:  Appearance: neat Behavior: appropriate to circumstances Mood: neutral Affect: mood congruent Speech: WNL Eye Contact: appropriate Psychomotor Activity: WNL Gait: unable to assess Thought Process: linear, logical, and goal directed and no  evidence or endorsement of suicidal, homicidal, and self-harm ideation, plan and intent  Thought Content/Perception: no hallucinations, delusions, bizarre thinking or behavior endorsed or observed Orientation: AAOx4 Memory/Concentration: memory, attention, language, and fund of knowledge intact  Insight: good Judgment: good  Interventions:  Conducted a brief chart review Provided empathic reflections and validation Employed supportive psychotherapy interventions to facilitate reduced distress and to improve coping skills with identified stressors Psychoeducation provided regarding self-compassion Engaged pt in a self-compassion exercise Reviewed learned skills  DSM-5 Diagnosis(es):  F50.89 Other Specified Feeding or Eating Disorder, Emotional Eating Behaviors, F90.9 Unspecified Attention-Deficit/Hyperactivity Disorder , and F41.9 Unspecified Anxiety Disorder  Treatment Goal & Progress: During the initial appointment with this provider, the following treatment goal was established: increase coping skills. Pamela [name] demonstrated progress in her goal as evidenced by increased awareness of hunger patterns, increased awareness of triggers for emotional eating behaviors, and reduction in emotional eating behaviors . Pamela [name] also continues to demonstrate willingness to engage in learned skill(s).  Plan: As previously planned, today was Pamela [name]'s last appointment with this provider. She will continue meeting with her primary therapist. She acknowledged understanding that she may request a follow-up appointment with this provider in the future as long as she is still established with the clinic. No further follow-up planned by this provider.

## 2021-08-17 ENCOUNTER — Other Ambulatory Visit (INDEPENDENT_AMBULATORY_CARE_PROVIDER_SITE_OTHER): Payer: Self-pay

## 2021-08-17 ENCOUNTER — Other Ambulatory Visit (HOSPITAL_COMMUNITY): Payer: Self-pay

## 2021-08-22 ENCOUNTER — Other Ambulatory Visit (HOSPITAL_COMMUNITY): Payer: Self-pay

## 2021-08-29 ENCOUNTER — Telehealth (INDEPENDENT_AMBULATORY_CARE_PROVIDER_SITE_OTHER): Payer: BC Managed Care – PPO | Admitting: Psychology

## 2021-08-29 DIAGNOSIS — F909 Attention-deficit hyperactivity disorder, unspecified type: Secondary | ICD-10-CM

## 2021-08-29 DIAGNOSIS — F5089 Other specified eating disorder: Secondary | ICD-10-CM | POA: Diagnosis not present

## 2021-08-29 DIAGNOSIS — F419 Anxiety disorder, unspecified: Secondary | ICD-10-CM | POA: Diagnosis not present

## 2021-09-13 ENCOUNTER — Ambulatory Visit (INDEPENDENT_AMBULATORY_CARE_PROVIDER_SITE_OTHER): Payer: BC Managed Care – PPO | Admitting: Family Medicine

## 2021-09-13 ENCOUNTER — Other Ambulatory Visit (HOSPITAL_COMMUNITY): Payer: Self-pay

## 2021-09-13 ENCOUNTER — Encounter (INDEPENDENT_AMBULATORY_CARE_PROVIDER_SITE_OTHER): Payer: Self-pay

## 2021-09-13 ENCOUNTER — Encounter (INDEPENDENT_AMBULATORY_CARE_PROVIDER_SITE_OTHER): Payer: Self-pay | Admitting: Family Medicine

## 2021-09-13 VITALS — BP 132/80 | HR 64 | Temp 98.4°F | Ht 65.0 in | Wt 253.0 lb

## 2021-09-13 DIAGNOSIS — I10 Essential (primary) hypertension: Secondary | ICD-10-CM

## 2021-09-13 DIAGNOSIS — Z6841 Body Mass Index (BMI) 40.0 and over, adult: Secondary | ICD-10-CM

## 2021-09-13 DIAGNOSIS — Z7985 Long-term (current) use of injectable non-insulin antidiabetic drugs: Secondary | ICD-10-CM | POA: Diagnosis not present

## 2021-09-13 DIAGNOSIS — E669 Obesity, unspecified: Secondary | ICD-10-CM

## 2021-09-13 DIAGNOSIS — E66813 Obesity, class 3: Secondary | ICD-10-CM

## 2021-09-13 MED ORDER — WEGOVY 2.4 MG/0.75ML ~~LOC~~ SOAJ
2.4000 mg | SUBCUTANEOUS | 0 refills | Status: DC
  Filled 2021-09-14: qty 3, 28d supply, fill #0

## 2021-09-13 MED ORDER — WEGOVY 2.4 MG/0.75ML ~~LOC~~ SOAJ
2.4000 mg | SUBCUTANEOUS | 0 refills | Status: DC
  Filled 2021-09-13: qty 3, 28d supply, fill #0

## 2021-09-14 ENCOUNTER — Other Ambulatory Visit (HOSPITAL_COMMUNITY): Payer: Self-pay

## 2021-09-20 NOTE — Progress Notes (Signed)
Chief Complaint:   OBESITY Pamela [name] is here to discuss her progress with her obesity treatment plan along with follow-up of her obesity related diagnoses. Pamela [name] is on the Category 3 Plan or keeping a food journal and adhering to recommended goals of 1500 calories and 100+ grams of protein daily and states she is following her eating plan approximately 80% of the time. Pamela [name] states she is lifting weights for 40 minutes 4 times per week.  Today's visit was #: 24 Starting weight: 333 lbs Starting date: 03/15/2020 Today's weight: 253 lbs Today's date: 09/13/2021 Total lbs lost to date: 80 Total lbs lost since last in-office visit: 0  Interim History: Pamela [name] continues to do well with weight loss. She is retaining some fluid today, but she has tried to recently increase her water intake. Her hunger is mostly controlled and she now takes her Wegovy in the PM and has no GI upset.   Subjective:   1. Essential hypertension Deseree's blood pressure is well controlled on her medications. She denies chest pain, headaches, or lightheadedness.   Assessment/Plan:   1. Essential hypertension Pamela [name] will continue with her diet and exercise, and her goal is to decrease the need for blood pressure medications in the future.   2. Obesity, Current BMI 42.2 Pamela [name] is currently in the action stage of change. As such, her goal is to continue with weight loss efforts. She has agreed to the Category 3 Plan or keeping a food journal and adhering to recommended goals of 1500 calories and 100+ grams of protein daily.   We discussed various medication options to help Pamela [name] with her weight loss efforts and we both agreed to continue Wegovy 2.4 mg once weekly, and we will refill for 1 month.  - Semaglutide-Weight Management (WEGOVY) 2.4 MG/0.75ML SOAJ; Inject 2.4 mg into the skin once a week.  Dispense: 3 mL; Refill: 0  Exercise goals: As is.   Behavioral modification strategies: meal planning and cooking  strategies.  Pamela [name] has agreed to follow-up with our clinic in 4 weeks. She was informed of the importance of frequent follow-up visits to maximize her success with intensive lifestyle modifications for her multiple health conditions.   Objective:   Blood pressure 132/80, pulse 64, temperature 98.4 F (36.9 C), height '5\' 5"'$  (1.651 m), weight 253 lb (114.8 kg), last menstrual period 03/16/2021, SpO2 100 %. Body mass index is 42.1 kg/m.  General: Cooperative, alert, well developed, in no acute distress. HEENT: Conjunctivae and lids unremarkable. Cardiovascular: Regular rhythm.  Lungs: Normal work of breathing. Neurologic: No focal deficits.   Lab Results  Component Value Date   CREATININE 0.94 05/29/2021   BUN 12 05/29/2021   NA 139 05/29/2021   K 3.9 05/29/2021   CL 101 05/29/2021   CO2 21 05/29/2021   Lab Results  Component Value Date   ALT 9 05/29/2021   AST 15 05/29/2021   ALKPHOS 73 05/29/2021   BILITOT <0.2 05/29/2021   Lab Results  Component Value Date   HGBA1C 5.5 05/29/2021   HGBA1C 6.4 (H) 07/28/2020   HGBA1C 6.4 (H) 03/15/2020   Lab Results  Component Value Date   INSULIN 13.1 05/29/2021   INSULIN 15.0 07/28/2020   INSULIN 30.0 (H) 03/15/2020   Lab Results  Component Value Date   TSH 2.170 03/15/2020   Lab Results  Component Value Date   CHOL 203 (H) 05/29/2021   HDL 59 05/29/2021   LDLCALC 125 (H) 05/29/2021   TRIG 108 05/29/2021  CHOLHDL 3.5 07/28/2020   Lab Results  Component Value Date   VD25OH 78.1 05/29/2021   VD25OH 68.5 07/28/2020   VD25OH 22.9 (L) 03/15/2020   Lab Results  Component Value Date   WBC 4.7 04/10/2021   HGB 14.1 04/10/2021   HCT 40.8 04/10/2021   MCV 88.3 04/10/2021   PLT 270 04/10/2021   Lab Results  Component Value Date   IRON 63 07/28/2020   TIBC 274 07/28/2020   FERRITIN 57 07/28/2020   Attestation Statements:   Reviewed by clinician on day of visit: allergies, medications, problem list, medical history,  surgical history, family history, social history, and previous encounter notes.  Time spent on visit including pre-visit chart review and post-visit care and charting was 30 minutes.   I, Trixie Dredge, am acting as transcriptionist for Dennard Nip, MD.  I have reviewed the above documentation for accuracy and completeness, and I agree with the above. -  Dennard Nip, MD

## 2021-09-22 ENCOUNTER — Other Ambulatory Visit (HOSPITAL_COMMUNITY): Payer: Self-pay

## 2021-10-11 ENCOUNTER — Other Ambulatory Visit (HOSPITAL_COMMUNITY): Payer: Self-pay

## 2021-10-11 MED ORDER — MELOXICAM 15 MG PO TABS
15.0000 mg | ORAL_TABLET | Freq: Every day | ORAL | 0 refills | Status: DC
  Filled 2021-10-11: qty 30, 30d supply, fill #0

## 2021-10-15 ENCOUNTER — Encounter (INDEPENDENT_AMBULATORY_CARE_PROVIDER_SITE_OTHER): Payer: Self-pay | Admitting: Family Medicine

## 2021-10-16 ENCOUNTER — Ambulatory Visit (INDEPENDENT_AMBULATORY_CARE_PROVIDER_SITE_OTHER): Payer: BC Managed Care – PPO | Admitting: Family Medicine

## 2021-10-17 ENCOUNTER — Ambulatory Visit (INDEPENDENT_AMBULATORY_CARE_PROVIDER_SITE_OTHER): Payer: BC Managed Care – PPO | Admitting: Adult Health

## 2021-10-19 ENCOUNTER — Ambulatory Visit (INDEPENDENT_AMBULATORY_CARE_PROVIDER_SITE_OTHER): Payer: BC Managed Care – PPO | Admitting: Adult Health

## 2021-10-19 ENCOUNTER — Other Ambulatory Visit (HOSPITAL_COMMUNITY): Payer: Self-pay

## 2021-10-19 ENCOUNTER — Encounter (INDEPENDENT_AMBULATORY_CARE_PROVIDER_SITE_OTHER): Payer: Self-pay | Admitting: Adult Health

## 2021-10-19 VITALS — BP 118/80 | HR 51 | Temp 97.8°F | Ht 65.0 in | Wt 251.0 lb

## 2021-10-19 DIAGNOSIS — Z6841 Body Mass Index (BMI) 40.0 and over, adult: Secondary | ICD-10-CM | POA: Diagnosis not present

## 2021-10-19 DIAGNOSIS — E669 Obesity, unspecified: Secondary | ICD-10-CM | POA: Diagnosis not present

## 2021-10-19 DIAGNOSIS — M25561 Pain in right knee: Secondary | ICD-10-CM | POA: Diagnosis not present

## 2021-10-19 DIAGNOSIS — I10 Essential (primary) hypertension: Secondary | ICD-10-CM | POA: Diagnosis not present

## 2021-10-19 MED ORDER — WEGOVY 2.4 MG/0.75ML ~~LOC~~ SOAJ
2.4000 mg | SUBCUTANEOUS | 0 refills | Status: DC
  Filled 2021-10-19 – 2021-10-27 (×4): qty 3, 28d supply, fill #0

## 2021-10-20 ENCOUNTER — Other Ambulatory Visit (HOSPITAL_COMMUNITY): Payer: Self-pay

## 2021-10-23 ENCOUNTER — Other Ambulatory Visit (HOSPITAL_COMMUNITY): Payer: Self-pay

## 2021-10-25 NOTE — Progress Notes (Signed)
Chief Complaint:   OBESITY Pamela [name] is here to discuss her progress with her obesity treatment plan along with follow-up of her obesity related diagnoses. Pamela [name] is on Category 3 Plan or keeping a food journal and adhering to recommended goals of 1500 calories and 100+ grams of protein daily and states she is following her eating plan approximately 95% of the time. Pamela [name] states she is doing cardio and weights 60 minutes 4 times per week.  Today's visit was #: 25 Starting weight: 333 lbs Starting date: 03/15/2021 Today's weight: 253 lbs Today's date: 10/19/2021 Total lbs lost to date: 80 lbs Total lbs lost since last in-office visit: 2 lbs  Interim History:  Pamela [name] estimates to consume 1800 calories and 98g protein per day.  She provided the following food recall for a typical day: Breakfast:  2 eggs, toast, apple, [place] bacon, fair life skim milk. Lunch: Protein, either chicken or salmon with fruit and cheese. Dinner:  8 oz protein and vegetable.   Subjective:   1. Essential hypertension Blood pressure and heart rate excellent at office visit.   2. Right knee pain, unspecified chronicity Recent increase in right knee pain since starting regular exercise.   [facility]: X-ray, steroid injection, and provided Rx of Meloxicam.  Assessment/Plan:   1. Essential hypertension Continue Losartan/HCTZ 100/'23mg'$  QD.  2. Right knee pain, unspecified chronicity OTC Voltaren Gel. RICE  3. Obesity, Current BMI 41.8 Change Dinner and Lunch timing.   Refill - Semaglutide-Weight Management (WEGOVY) 2.4 MG/0.75ML SOAJ; Inject 2.4 mg into the skin once a week.  Dispense: 3 mL; Refill: 0  Pamela [name] is currently in the action stage of change. As such, her goal is to continue with weight loss efforts. She has agreed to keeping a food journal and adhering to recommended goals of 1500-1800 calories and 110 protein.   Exercise goals:  as is.   Behavioral modification strategies:  increasing lean protein intake, decreasing simple carbohydrates, meal planning and cooking strategies, keeping healthy foods in the home, and planning for success.  Pamela [name] has agreed to follow-up with our clinic in 4 weeks. She was informed of the importance of frequent follow-up visits to maximize her success with intensive lifestyle modifications for her multiple health conditions.   Objective:   Blood pressure 118/80, pulse (!) 51, temperature 97.8 F (36.6 C), height '5\' 5"'$  (1.651 m), weight 251 lb (113.9 kg), last menstrual period 03/16/2021, SpO2 98 %. Body mass index is 41.77 kg/m.  General: Cooperative, alert, well developed, in no acute distress. HEENT: Conjunctivae and lids unremarkable. Cardiovascular: Regular rhythm.  Lungs: Normal work of breathing. Neurologic: No focal deficits.   Lab Results  Component Value Date   CREATININE 0.94 05/29/2021   BUN 12 05/29/2021   NA 139 05/29/2021   K 3.9 05/29/2021   CL 101 05/29/2021   CO2 21 05/29/2021   Lab Results  Component Value Date   ALT 9 05/29/2021   AST 15 05/29/2021   ALKPHOS 73 05/29/2021   BILITOT <0.2 05/29/2021   Lab Results  Component Value Date   HGBA1C 5.5 05/29/2021   HGBA1C 6.4 (H) 07/28/2020   HGBA1C 6.4 (H) 03/15/2020   Lab Results  Component Value Date   INSULIN 13.1 05/29/2021   INSULIN 15.0 07/28/2020   INSULIN 30.0 (H) 03/15/2020   Lab Results  Component Value Date   TSH 2.170 03/15/2020   Lab Results  Component Value Date   CHOL 203 (H) 05/29/2021  HDL 59 05/29/2021   LDLCALC 125 (H) 05/29/2021   TRIG 108 05/29/2021   CHOLHDL 3.5 07/28/2020   Lab Results  Component Value Date   VD25OH 78.1 05/29/2021   VD25OH 68.5 07/28/2020   VD25OH 22.9 (L) 03/15/2020   Lab Results  Component Value Date   WBC 4.7 04/10/2021   HGB 14.1 04/10/2021   HCT 40.8 04/10/2021   MCV 88.3 04/10/2021   PLT 270 04/10/2021   Lab Results  Component Value Date   IRON 63 07/28/2020   TIBC 274  07/28/2020   FERRITIN 57 07/28/2020   Attestation Statements:   Reviewed by clinician on day of visit: allergies, medications, problem list, medical history, surgical history, family history, social history, and previous encounter notes.  I, Davy Pique, RMA, am acting as Location manager for Mina Marble, NP.  I have reviewed the above documentation for accuracy and completeness, and I agree with the above. -  Meko Bellanger d. Jasmain Ahlberg, NP-C

## 2021-10-26 ENCOUNTER — Telehealth (INDEPENDENT_AMBULATORY_CARE_PROVIDER_SITE_OTHER): Payer: Self-pay | Admitting: Adult Health

## 2021-10-26 ENCOUNTER — Encounter (INDEPENDENT_AMBULATORY_CARE_PROVIDER_SITE_OTHER): Payer: Self-pay | Admitting: Family Medicine

## 2021-10-26 ENCOUNTER — Other Ambulatory Visit (HOSPITAL_COMMUNITY): Payer: Self-pay

## 2021-10-26 ENCOUNTER — Telehealth (INDEPENDENT_AMBULATORY_CARE_PROVIDER_SITE_OTHER): Payer: Self-pay | Admitting: *Deleted

## 2021-10-26 MED ORDER — METFORMIN HCL 500 MG PO TABS
500.0000 mg | ORAL_TABLET | Freq: Every day | ORAL | 0 refills | Status: DC
  Filled 2021-10-26: qty 30, 30d supply, fill #0

## 2021-10-26 NOTE — Telephone Encounter (Signed)
Phone call from patient. Pamela [name] is approved just waiting to see if she is going to be able to pick up. Patient will not fill the metformin unless she can not get the [facility]. Patient will call back Monday if she has trouble getting medication.

## 2021-10-26 NOTE — Telephone Encounter (Signed)
Phone call from patient checking on the Prior authorization status for he wegovy. I told her I will check and give her a call back. ok per Dr. Leafy Ro to refill metformin until we can get the [facility] approved and to the patient. Waiting for patient to call back.

## 2021-10-26 NOTE — Telephone Encounter (Signed)
Ria Comment (front office) reached out to me, due to patient calling about the status of Wegovy. Patient insurance told her that they had not receive a prior authorization request. Patient informed it was processed today due to receiving wrong insurance information. I personally called [facility] to make sure they had received the prior authorization for [facility]. They confirmed that they had received it. It has been approved for 7 months, but has not been finalized in system. I personally called patient and spoke to her by phone. Patient informed that I had spoken to her pharmacy and that it was approved, but it has not been finalized in system. I informed patient that I will monitor until I get off today until 5:15 pm and notify her by mychart. Patient informed that we are closed on Friday and to check with pharmacy later today or tomorrow for it to be approved in the system. Patient informed that she will receive mychart message from me even if she gets medication over the weekend with the effective dates. Patient thanked me for calling and explaining.

## 2021-10-27 ENCOUNTER — Other Ambulatory Visit (HOSPITAL_COMMUNITY): Payer: Self-pay

## 2021-10-30 ENCOUNTER — Telehealth (INDEPENDENT_AMBULATORY_CARE_PROVIDER_SITE_OTHER): Payer: Self-pay | Admitting: Adult Health

## 2021-10-30 ENCOUNTER — Encounter (INDEPENDENT_AMBULATORY_CARE_PROVIDER_SITE_OTHER): Payer: Self-pay

## 2021-10-30 NOTE — Telephone Encounter (Signed)
Mina Marble - Prior authorization approved for [employer]. Effective: 10/26/2021 - 05/27/2022. Patient sent approval message via mychart.

## 2021-11-06 ENCOUNTER — Other Ambulatory Visit (HOSPITAL_COMMUNITY): Payer: Self-pay

## 2021-11-09 ENCOUNTER — Other Ambulatory Visit (HOSPITAL_COMMUNITY): Payer: Self-pay

## 2021-11-09 MED ORDER — CYCLOBENZAPRINE HCL 10 MG PO TABS
10.0000 mg | ORAL_TABLET | Freq: Every evening | ORAL | 1 refills | Status: DC | PRN
  Filled 2021-11-09: qty 30, 30d supply, fill #0

## 2021-11-09 MED ORDER — MELOXICAM 15 MG PO TABS
15.0000 mg | ORAL_TABLET | Freq: Every day | ORAL | 2 refills | Status: DC
  Filled 2021-11-09 – 2022-03-08 (×2): qty 30, 30d supply, fill #0
  Filled 2022-04-24: qty 30, 30d supply, fill #1

## 2021-11-10 ENCOUNTER — Other Ambulatory Visit (HOSPITAL_COMMUNITY): Payer: Self-pay

## 2021-11-18 ENCOUNTER — Other Ambulatory Visit (HOSPITAL_BASED_OUTPATIENT_CLINIC_OR_DEPARTMENT_OTHER): Payer: Self-pay

## 2021-11-23 ENCOUNTER — Other Ambulatory Visit (HOSPITAL_BASED_OUTPATIENT_CLINIC_OR_DEPARTMENT_OTHER): Payer: Self-pay

## 2021-11-23 ENCOUNTER — Ambulatory Visit (INDEPENDENT_AMBULATORY_CARE_PROVIDER_SITE_OTHER): Payer: BC Managed Care – PPO | Admitting: Adult Health

## 2021-11-23 ENCOUNTER — Encounter: Payer: Self-pay | Admitting: Family Medicine

## 2021-11-23 ENCOUNTER — Encounter (INDEPENDENT_AMBULATORY_CARE_PROVIDER_SITE_OTHER): Payer: Self-pay | Admitting: Adult Health

## 2021-11-23 ENCOUNTER — Ambulatory Visit: Payer: BC Managed Care – PPO | Admitting: Family Medicine

## 2021-11-23 VITALS — BP 122/80 | HR 56 | Temp 98.1°F | Resp 16 | Ht 65.0 in | Wt 250.0 lb

## 2021-11-23 VITALS — BP 120/78 | HR 73 | Temp 98.0°F | Ht 65.0 in | Wt 246.0 lb

## 2021-11-23 DIAGNOSIS — E559 Vitamin D deficiency, unspecified: Secondary | ICD-10-CM | POA: Diagnosis not present

## 2021-11-23 DIAGNOSIS — E782 Mixed hyperlipidemia: Secondary | ICD-10-CM

## 2021-11-23 DIAGNOSIS — I10 Essential (primary) hypertension: Secondary | ICD-10-CM | POA: Diagnosis not present

## 2021-11-23 DIAGNOSIS — Z7689 Persons encountering health services in other specified circumstances: Secondary | ICD-10-CM

## 2021-11-23 DIAGNOSIS — Z6841 Body Mass Index (BMI) 40.0 and over, adult: Secondary | ICD-10-CM

## 2021-11-23 DIAGNOSIS — E669 Obesity, unspecified: Secondary | ICD-10-CM | POA: Diagnosis not present

## 2021-11-23 DIAGNOSIS — R7303 Prediabetes: Secondary | ICD-10-CM

## 2021-11-23 DIAGNOSIS — R0602 Shortness of breath: Secondary | ICD-10-CM

## 2021-11-23 DIAGNOSIS — K219 Gastro-esophageal reflux disease without esophagitis: Secondary | ICD-10-CM

## 2021-11-23 MED ORDER — WEGOVY 2.4 MG/0.75ML ~~LOC~~ SOAJ
2.4000 mg | SUBCUTANEOUS | 0 refills | Status: DC
  Filled 2021-11-23: qty 3, 28d supply, fill #0

## 2021-11-23 MED ORDER — ALBUTEROL SULFATE HFA 108 (90 BASE) MCG/ACT IN AERS
2.0000 | INHALATION_SPRAY | Freq: Four times a day (QID) | RESPIRATORY_TRACT | 3 refills | Status: DC | PRN
  Filled 2021-11-23: qty 6.7, 25d supply, fill #0
  Filled 2022-01-10: qty 6.7, 25d supply, fill #1

## 2021-11-23 MED ORDER — CETIRIZINE HCL 10 MG PO TABS
10.0000 mg | ORAL_TABLET | Freq: Every day | ORAL | 1 refills | Status: DC
  Filled 2021-11-23: qty 90, 90d supply, fill #0
  Filled 2022-03-08: qty 90, 90d supply, fill #1

## 2021-11-23 MED ORDER — FAMOTIDINE 40 MG PO TABS
40.0000 mg | ORAL_TABLET | Freq: Two times a day (BID) | ORAL | 1 refills | Status: DC
  Filled 2021-11-23: qty 180, 90d supply, fill #0
  Filled 2022-03-08: qty 180, 90d supply, fill #1

## 2021-11-23 MED ORDER — LOSARTAN POTASSIUM-HCTZ 100-25 MG PO TABS
1.0000 | ORAL_TABLET | Freq: Every day | ORAL | 3 refills | Status: DC
  Filled 2021-11-23 – 2022-03-08 (×2): qty 90, 90d supply, fill #0
  Filled 2022-06-26: qty 90, 90d supply, fill #1
  Filled 2022-10-08 – 2022-10-19 (×3): qty 90, 90d supply, fill #2

## 2021-11-23 NOTE — Progress Notes (Signed)
New Patient Office Visit  Subjective    Patient ID: Pamela N Mummert, female    DOB: 03-16-1970  Age: 51 y.o. MRN: [id number]  CC:  Chief Complaint  Patient presents with   Establish Care    HPI Pamela [name] presents to establish care and for review of chronic med issues. Patient denies acute complaints or concerns.    Outpatient Encounter Medications as of 11/23/2021  Medication Sig   famotidine (PEPCID) 40 MG tablet Take 1 tablet (40 mg total) by mouth 2 (two) times daily.   albuterol (VENTOLIN HFA) 108 (90 Base) MCG/ACT inhaler Inhale 2 puffs into the lungs every 6 (six) hours as needed for wheezing or shortness of breath.   cetirizine (ZYRTEC) 10 MG tablet Take 1 tablet (10 mg total) by mouth daily.   Cholecalciferol (VITAMIN D) 125 MCG (5000 UT) CAPS Take 1 capsule Monday through Friday   cyclobenzaprine (FLEXERIL) 10 MG tablet Take 1 tablet (10 mg total) by mouth at bedtime as needed for spasm   fluticasone (FLONASE) 50 MCG/ACT nasal spray Place 2 sprays into both nostrils daily as needed for allergies or rhinitis.   fluticasone (FLOVENT HFA) 110 MCG/ACT inhaler For asthma flare, inhale 2 puffs into the lungs 2 (two) times daily for 2 weeks or until cough and wheeze free. (Patient taking differently: Inhale 2 puffs into the lungs 2 (two) times daily. As of 04/03/21 patient stated that she had not filled this prescription.)   losartan-hydrochlorothiazide (HYZAAR) 100-25 MG tablet Take 1 tablet by mouth daily.   meloxicam (MOBIC) 15 MG tablet Take 1 tablet (15 mg total) by mouth daily with meals   Misc Natural Products (DAILY HERBS IMMUNE DEFENSE PO) Take by mouth.   NON FORMULARY Fiber gummy, 3 gummies per day   pantoprazole (PROTONIX) 40 MG tablet pantoprazole 40 mg tablet,delayed release  TK 1 T PO D   Semaglutide-Weight Management (WEGOVY) 2.4 MG/0.75ML SOAJ Inject 2.4 mg into the skin once a week.   vitamin B-12 (CYANOCOBALAMIN) 500 MCG tablet 400-500 mcg qd otc  supplement   [DISCONTINUED] albuterol (VENTOLIN HFA) 108 (90 Base) MCG/ACT inhaler Inhale 2 puffs into the lungs every 6 (six) hours as needed for wheezing or shortness of breath.   [DISCONTINUED] cetirizine (ZYRTEC) 10 MG tablet Take 10 mg by mouth daily.   [DISCONTINUED] losartan-hydrochlorothiazide (HYZAAR) 100-25 MG tablet Take 1 tablet by mouth daily.   [DISCONTINUED] metFORMIN (GLUCOPHAGE) 500 MG tablet Take 1 tablet (500 mg total) by mouth daily with breakfast.   No facility-administered encounter medications on file as of 11/23/2021.    Past Medical History:  Diagnosis Date   ADHD    Anxiety    Asthma    Patient follows with [facility]. LOV 03/24/21 with FNP Gareth Morgan.As of 04/03/21, pt is using her inhaler 1 -2 times per day per pt.   Back pain    Constipation    COVID 03/09/2021   Patient took antiviral. As of 04/03/21, pt states that all symptoms are resolved.   GERD (gastroesophageal reflux disease)    Hypertension    IBS (irritable bowel syndrome)    Joint pain    Lower extremity edema    Obesity    Pre-diabetes    07/28/20 A1C 6.4 / Patient is taking KZSWFU.   Sleep apnea    05/03/20 sleep study = moderate to severe sleep apnea. Wears CPAP.   Wears glasses     Past Surgical History:  Procedure Laterality Date   CESAREAN SECTION  1998   COLONOSCOPY  08/19/2020   OVARIAN CYST REMOVAL  1991   ROBOTIC ASSISTED LAPAROSCOPIC HYSTERECTOMY AND SALPINGECTOMY Bilateral 04/12/2021   Procedure: XI ROBOTIC ASSISTED LAPAROSCOPIC HYSTERECTOMY AND BILATERAL SALPINGECTOMY;  Surgeon: Drema Dallas, DO;  Location: [facility];  Service: Gynecology;  Laterality: Bilateral;   TUBAL LIGATION  2008   WISDOM TOOTH EXTRACTION  1986    Family History  Problem Relation Age of Onset   Asthma Other    Heart failure Other    Hypertension Other    Hypertension Mother    Hypertension Father    Heart disease Father    Alcoholism Father      Social History   Socioeconomic History   Marital status: Married    Spouse name: Not on file   Number of children: Not on file   Years of education: Not on file   Highest education level: Not on file  Occupational History   Occupation: Educator   Tobacco Use   Smoking status: Never   Smokeless tobacco: Never  Vaping Use   Vaping Use: Never used  Substance and Sexual Activity   Alcohol use: Yes    Comment: occasionally, once per month   Drug use: No   Sexual activity: Yes    Birth control/protection: Surgical    Comment: tubal ligation  Other Topics Concern   Not on file  Social History Narrative   Lives at home with husband   Right handed   Caffeine: 1 cup/day   Social Determinants of Health   Financial Resource Strain: Not on file  Food Insecurity: Not on file  Transportation Needs: Not on file  Physical Activity: Not on file  Stress: Not on file  Social Connections: Not on file  Intimate Partner Violence: Not on file    Review of Systems  All other systems reviewed and are negative.       Objective    BP 122/80   Pulse (!) 56   Temp 98.1 F (36.7 C) (Oral)   Resp 16   Ht '5\' 5"'$  (1.651 m)   Wt 250 lb (113.4 kg)   LMP 03/16/2021 (Exact Date)   BMI 41.60 kg/m   Physical Exam Vitals and nursing note reviewed.  Constitutional:      General: She is not in acute distress.    Appearance: She is obese.  Cardiovascular:     Rate and Rhythm: Normal rate and regular rhythm.  Pulmonary:     Effort: Pulmonary effort is normal.     Breath sounds: Normal breath sounds.  Abdominal:     Palpations: Abdomen is soft.     Tenderness: There is no abdominal tenderness.  Neurological:     General: No focal deficit present.     Mental Status: She is alert and oriented to person, place, and time.         Assessment & Plan:   1. Essential hypertension Appears stable with present management. Continue. Meds refilled.   2. Obesity, Current BMI  41.8 Management per consultant - bariatrics  3. Mixed hyperlipidemia Continue   4. Prediabetes Discussed dietary and activity options. Recent A1c was WNL  5. Gastroesophageal reflux disease without esophagitis Discussed dietary and activity options. Patient   6. Encounter to establish care     Return in about 3 months (around 02/23/2022) for physical.   Becky Sax, MD

## 2021-11-24 LAB — COMPREHENSIVE METABOLIC PANEL
ALT: 14 IU/L (ref 0–32)
AST: 14 IU/L (ref 0–40)
Albumin/Globulin Ratio: 1.4 (ref 1.2–2.2)
Albumin: 4 g/dL (ref 3.8–4.9)
Alkaline Phosphatase: 64 IU/L (ref 44–121)
BUN/Creatinine Ratio: 20 (ref 9–23)
BUN: 17 mg/dL (ref 6–24)
Bilirubin Total: 0.4 mg/dL (ref 0.0–1.2)
CO2: 19 mmol/L — ABNORMAL LOW (ref 20–29)
Calcium: 9.4 mg/dL (ref 8.7–10.2)
Chloride: 100 mmol/L (ref 96–106)
Creatinine, Ser: 0.84 mg/dL (ref 0.57–1.00)
Globulin, Total: 2.8 g/dL (ref 1.5–4.5)
Glucose: 85 mg/dL (ref 70–99)
Potassium: 4.3 mmol/L (ref 3.5–5.2)
Sodium: 138 mmol/L (ref 134–144)
Total Protein: 6.8 g/dL (ref 6.0–8.5)
eGFR: 84 mL/min/{1.73_m2} (ref >59)

## 2021-11-24 LAB — HEMOGLOBIN A1C
Est. average glucose Bld gHb Est-mCnc: 114 mg/dL
Hgb A1c MFr Bld: 5.6 % (ref 4.8–5.6)

## 2021-11-24 LAB — LIPID PANEL
Chol/HDL Ratio: 3.3 ratio (ref 0.0–4.4)
Cholesterol, Total: 194 mg/dL (ref 100–199)
HDL: 59 mg/dL (ref >39)
LDL Chol Calc (NIH): 124 mg/dL — ABNORMAL HIGH (ref 0–99)
Triglycerides: 62 mg/dL (ref 0–149)
VLDL Cholesterol Cal: 11 mg/dL (ref 5–40)

## 2021-11-24 LAB — INSULIN, RANDOM: INSULIN: 11 u[IU]/mL (ref 2.6–24.9)

## 2021-11-24 LAB — VITAMIN D 25 HYDROXY (VIT D DEFICIENCY, FRACTURES): Vit D, 25-Hydroxy: 95.2 ng/mL (ref 30.0–100.0)

## 2021-11-30 DIAGNOSIS — R0602 Shortness of breath: Secondary | ICD-10-CM | POA: Insufficient documentation

## 2021-11-30 NOTE — Progress Notes (Signed)
Chief Complaint:   OBESITY Pamela [name] is here to discuss her progress with her obesity treatment plan along with follow-up of her obesity related diagnoses. Pamela [name] is on keeping a food journal and adhering to recommended goals of 1500 calories and 110 protein and states she is following her eating plan approximately 90% of the time. Pamela [name] states she is cardio and lifting 30 minutes 4 times per week.  Today's visit was #: 45 Starting weight: 333 lbs Starting date: 03/15/2021 Today's weight: 246 lbs Today's date: 11/23/2021 Total lbs lost to date: 87 lbs Total lbs lost since last in-office visit: 5 lbs  Interim History:  She injects Wegovy 2.4 mg on Sunday evening - denies SE with GLP-1 therapy.  Subjective:   1. SOB (shortness of breath) on exertion 03/15/2020, RMR 2490 11/23/2021, RMR 1886, metabolism decreased by 604 calories.  She denies chest pain with exertion, but endorses dyspnea with exertion.    2. Vitamin D deficiency She is taking OTC Vitamin D-3, 5,000 IU Monday-Friday.   3. Pre-diabetes Lab Results  Component Value Date   HGBA1C 5.6 11/23/2021   HGBA1C 5.5 05/29/2021   HGBA1C 6.4 (H) 07/28/2020    Currently on metformin 500 mg daily and Wegovy 2.4 mg weekly.   4. Mixed hyperlipidemia She is not on statin therapy.   Assessment/Plan:   1. SOB (shortness of breath) on exertion Check IC today.   2. Vitamin D deficiency Check labs today.   - VITAMIN D 25 Hydroxy (Vit-D Deficiency, Fractures)  3. Pre-diabetes Check labs today.   - Hemoglobin A1c - Insulin, random  4. Mixed hyperlipidemia Check labs today.   - Comprehensive metabolic panel - Lipid panel  5. Obesity, Current BMI 40.9 Refill - Semaglutide-Weight Management (WEGOVY) 2.4 MG/0.75ML SOAJ; Inject 2.4 mg into the skin once a week.  Dispense: 3 mL; Refill: 0  Pamela [name] is currently in the action stage of change. As such, her goal is to continue with weight loss efforts. She has agreed to  keeping a food journal and adhering to recommended goals of 1400 calories and 90 protein.   Exercise goals:  As is.   Behavioral modification strategies: increasing lean protein intake, decreasing simple carbohydrates, meal planning and cooking strategies, keeping healthy foods in the home, and planning for success.  Pamela [name] has agreed to follow-up with our clinic in 4 weeks. She was informed of the importance of frequent follow-up visits to maximize her success with intensive lifestyle modifications for her multiple health conditions.   Pamela [name] was informed we would discuss her lab results at her next visit unless there is a critical issue that needs to be addressed sooner. Pamela [name] agreed to keep her next visit at the agreed upon time to discuss these results.  Objective:   Blood pressure 120/78, pulse 73, temperature 98 F (36.7 C), height '5\' 5"'$  (1.651 m), weight 246 lb (111.6 kg), last menstrual period 03/16/2021, SpO2 99 %. Body mass index is 40.94 kg/m.  General: Cooperative, alert, well developed, in no acute distress. HEENT: Conjunctivae and lids unremarkable. Cardiovascular: Regular rhythm.  Lungs: Normal work of breathing. Neurologic: No focal deficits.   Lab Results  Component Value Date   CREATININE 0.84 11/23/2021   BUN 17 11/23/2021   NA 138 11/23/2021   K 4.3 11/23/2021   CL 100 11/23/2021   CO2 19 (L) 11/23/2021   Lab Results  Component Value Date   ALT 14 11/23/2021   AST 14 11/23/2021   ALKPHOS 64 11/23/2021  BILITOT 0.4 11/23/2021   Lab Results  Component Value Date   HGBA1C 5.6 11/23/2021   HGBA1C 5.5 05/29/2021   HGBA1C 6.4 (H) 07/28/2020   HGBA1C 6.4 (H) 03/15/2020   Lab Results  Component Value Date   INSULIN 11.0 11/23/2021   INSULIN 13.1 05/29/2021   INSULIN 15.0 07/28/2020   INSULIN 30.0 (H) 03/15/2020   Lab Results  Component Value Date   TSH 2.170 03/15/2020   Lab Results  Component Value Date   CHOL 194 11/23/2021   HDL 59 11/23/2021    LDLCALC 124 (H) 11/23/2021   TRIG 62 11/23/2021   CHOLHDL 3.3 11/23/2021   Lab Results  Component Value Date   VD25OH 95.2 11/23/2021   VD25OH 78.1 05/29/2021   VD25OH 68.5 07/28/2020   Lab Results  Component Value Date   WBC 4.7 04/10/2021   HGB 14.1 04/10/2021   HCT 40.8 04/10/2021   MCV 88.3 04/10/2021   PLT 270 04/10/2021   Lab Results  Component Value Date   IRON 63 07/28/2020   TIBC 274 07/28/2020   FERRITIN 57 07/28/2020   Attestation Statements:   Reviewed by clinician on day of visit: allergies, medications, problem list, medical history, surgical history, family history, social history, and previous encounter notes.  I, Davy Pique, RMA, am acting as Location manager for Mina Marble, NP.  I have reviewed the above documentation for accuracy and completeness, and I agree with the above. -  Imagene Boss d. Ambriana Selway, NP-C

## 2021-12-21 ENCOUNTER — Other Ambulatory Visit (HOSPITAL_BASED_OUTPATIENT_CLINIC_OR_DEPARTMENT_OTHER): Payer: Self-pay

## 2021-12-21 ENCOUNTER — Encounter (INDEPENDENT_AMBULATORY_CARE_PROVIDER_SITE_OTHER): Payer: Self-pay | Admitting: Family Medicine

## 2021-12-21 ENCOUNTER — Ambulatory Visit (INDEPENDENT_AMBULATORY_CARE_PROVIDER_SITE_OTHER): Payer: BC Managed Care – PPO | Admitting: Family Medicine

## 2021-12-21 VITALS — BP 118/75 | HR 60 | Temp 97.9°F | Ht 65.0 in | Wt 245.0 lb

## 2021-12-21 DIAGNOSIS — R4589 Other symptoms and signs involving emotional state: Secondary | ICD-10-CM | POA: Diagnosis not present

## 2021-12-21 DIAGNOSIS — Z6841 Body Mass Index (BMI) 40.0 and over, adult: Secondary | ICD-10-CM | POA: Diagnosis not present

## 2021-12-21 DIAGNOSIS — E669 Obesity, unspecified: Secondary | ICD-10-CM | POA: Diagnosis not present

## 2021-12-21 MED ORDER — WEGOVY 2.4 MG/0.75ML ~~LOC~~ SOAJ
2.4000 mg | SUBCUTANEOUS | 0 refills | Status: DC
  Filled 2021-12-21: qty 3, 28d supply, fill #0

## 2022-01-02 NOTE — Progress Notes (Signed)
Chief Complaint:   OBESITY Pamela [name] is here to discuss her progress with her obesity treatment plan along with follow-up of her obesity related diagnoses. Pamela [name] is on keeping a food journal and adhering to recommended goals of 1400 calories and 90 grams of protein and states she is following her eating plan approximately 50% of the time. Pamela [name] states she is doing cardio and weight lifting for 60 minutes 2 times per week.  Today's visit was #: 26 Starting weight: 333 lbs Starting date: 03/15/2021 Today's weight: 245 lbs Today's date: 12/21/2021 Total lbs lost to date: 88 Total lbs lost since last in-office visit: 1  Interim History: Pamela [name] is doing well with weight loss, but she is struggling especially with meal planning.  She will be hosting Thanksgiving, but she already has good eating strategies.  Subjective:   1. Depressed mood, with emotional eating Pamela [name] is seeing a general therapist and she feels she is doing well overall dealing with her anxiety.  She is not on medications.  Assessment/Plan:   1. Depressed mood, with emotional eating Emotional eating behavior strategies were discussed with the patient today.  Pamela [name] continue to follow-up.  2. Obesity, Current BMI 40.8 Pamela [name] is currently in the action stage of change. As such, her goal is to continue with weight loss efforts. She has agreed to the Category 2 Plan + 100 calories.   Pamela [name] will continue Wegovy 2.4 mg once weekly, and we will refill for 1 month.   - Semaglutide-Weight Management (WEGOVY) 2.4 MG/0.75ML SOAJ; Inject 2.4 mg into the skin once a week.  Dispense: 3 mL; Refill: 0  Exercise goals: As is.   Behavioral modification strategies: increasing lean protein intake and holiday eating strategies .  Pamela [name] has agreed to follow-up with our clinic in 3 weeks. She was informed of the importance of frequent follow-up visits to maximize her success with intensive lifestyle modifications for her multiple health  conditions.   Objective:   Blood pressure 118/75, pulse 60, temperature 97.9 F (36.6 C), height '5\' 5"'$  (1.651 m), weight 245 lb (111.1 kg), last menstrual period 03/16/2021, SpO2 100 %. Body mass index is 40.77 kg/m.  General: Cooperative, alert, well developed, in no acute distress. HEENT: Conjunctivae and lids unremarkable. Cardiovascular: Regular rhythm.  Lungs: Normal work of breathing. Neurologic: No focal deficits.   Lab Results  Component Value Date   CREATININE 0.84 11/23/2021   BUN 17 11/23/2021   NA 138 11/23/2021   K 4.3 11/23/2021   CL 100 11/23/2021   CO2 19 (L) 11/23/2021   Lab Results  Component Value Date   ALT 14 11/23/2021   AST 14 11/23/2021   ALKPHOS 64 11/23/2021   BILITOT 0.4 11/23/2021   Lab Results  Component Value Date   HGBA1C 5.6 11/23/2021   HGBA1C 5.5 05/29/2021   HGBA1C 6.4 (H) 07/28/2020   HGBA1C 6.4 (H) 03/15/2020   Lab Results  Component Value Date   INSULIN 11.0 11/23/2021   INSULIN 13.1 05/29/2021   INSULIN 15.0 07/28/2020   INSULIN 30.0 (H) 03/15/2020   Lab Results  Component Value Date   TSH 2.170 03/15/2020   Lab Results  Component Value Date   CHOL 194 11/23/2021   HDL 59 11/23/2021   LDLCALC 124 (H) 11/23/2021   TRIG 62 11/23/2021   CHOLHDL 3.3 11/23/2021   Lab Results  Component Value Date   VD25OH 95.2 11/23/2021   VD25OH 78.1 05/29/2021   VD25OH 68.5 07/28/2020   Lab Results  Component Value Date   WBC 4.7 04/10/2021   HGB 14.1 04/10/2021   HCT 40.8 04/10/2021   MCV 88.3 04/10/2021   PLT 270 04/10/2021   Lab Results  Component Value Date   IRON 63 07/28/2020   TIBC 274 07/28/2020   FERRITIN 57 07/28/2020   Attestation Statements:   Reviewed by clinician on day of visit: allergies, medications, problem list, medical history, surgical history, family history, social history, and previous encounter notes.  Time spent on visit including pre-visit chart review and post-visit care and charting was 36  minutes.   I, Trixie Dredge, am acting as transcriptionist for Dennard Nip, MD.  I have reviewed the above documentation for accuracy and completeness, and I agree with the above. -  Dennard Nip, MD

## 2022-01-02 NOTE — Progress Notes (Unsigned)
Patient ID: Pamela [name], female    DOB: Apr 11, 1970  MRN: [id number]  CC: No chief complaint on file.   Subjective: Pamela Coombes is a 51 y.o. female who presents for  Her concerns today include:  Ear infection   Duration: {Blank single:19197::"days","weeks","months"} Involved ear(s): {Blank single:19197::"left","right","bilateral"} Severity:  {Blank single:19197::"mild","moderate","severe","1/10","2/10","3/10","4/10","5/10","6/10","7/10","8/10","9/10","10/10"}  Quality:  {Blank multiple:19196::"sharp","dull","aching","burning","cramping","ill-defined","itchy","pressure-like","pulling","shooting","sore","stabbing","tender","tearing","throbbing"} Fever: {Blank single:19197::"yes","no"} Otorrhea: {Blank single:19197::"yes","no"} Upper respiratory infection symptoms: {Blank single:19197::"yes","no"} Pruritus: {Blank single:19197::"yes","no"} Hearing loss: {Blank single:19197::"yes","no"} Water immersion {Blank single:19197::"yes","no"} Using Q-tips: {Blank single:19197::"yes","no"} Recurrent otitis media: {Blank single:19197::"yes","no"} Status: {Blank multiple:19196::"better","worse","stable","fluctuating"} Treatments attempted: {Blank single:19197::"none","pseudoephedrine"}   Patient Active Problem List   Diagnosis Date Noted   SOB (shortness of breath) on exertion 11/30/2021   Eating disorder 05/03/2021   Abnormal uterine bleeding (AUB) 04/12/2021   Epistaxis 03/24/2021    Other Specified Feeding or Eating Disorder, Emotional Eating Behaviors 03/23/2021   B12 deficiency 11/14/2020   Vitamin D deficiency 07/28/2020   Essential hypertension 07/28/2020   Mixed hyperlipidemia 07/28/2020   Low ferritin 07/28/2020   Prediabetes 07/17/2020   Class 3 severe obesity with serious comorbidity and body mass index (BMI) of 50.0 to 59.9 in adult ([place]) 07/07/2020   Depressed mood, with emotional eating 05/04/2020   Family history of colon cancer 04/13/2020   Screening for  malignant neoplasm of colon 04/13/2020   Anal fissure 04/13/2020   Change in bowel habit 04/13/2020   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   Chronic idiopathic constipation 04/13/2020   Constipation 04/13/2020   Irritable bowel syndrome with constipation 04/13/2020   Dysuria 04/13/2020   Family history of malignant neoplasm of gastrointestinal tract 04/13/2020   Generalized abdominal pain 04/13/2020   History of colonic polyps 04/13/2020   Morbid obesity ([place]) 04/13/2020   Rectal bleeding 04/13/2020   Urinary tract infectious disease 04/13/2020   Gastro-esophageal reflux disease without esophagitis 04/13/2020   Gastroesophageal reflux disease 03/25/2020   Perennial allergic rhinitis 08/23/2016   Mild persistent asthma, uncomplicated 93/23/5573     Current Outpatient Medications on File Prior to Visit  Medication Sig Dispense Refill   albuterol (VENTOLIN HFA) 108 (90 Base) MCG/ACT inhaler Inhale 2 puffs into the lungs every 6 (six) hours as needed for wheezing or shortness of breath. 6.7 g 3   cetirizine (ZYRTEC) 10 MG tablet Take 1 tablet (10 mg total) by mouth daily. 90 tablet 1   Cholecalciferol (VITAMIN D) 125 MCG (5000 UT) CAPS Take 1 capsule Monday through Friday 30 capsule 0   cyclobenzaprine (FLEXERIL) 10 MG tablet Take 1 tablet (10 mg total) by mouth at bedtime as needed for spasm 30 tablet 1   famotidine (PEPCID) 40 MG tablet Take 1 tablet (40 mg total) by mouth 2 (two) times daily. 180 tablet 1   fluticasone (FLONASE) 50 MCG/ACT nasal spray Place 2 sprays into both nostrils daily as needed for allergies or rhinitis. 16 g 3   fluticasone (FLOVENT HFA) 110 MCG/ACT inhaler For asthma flare, inhale 2 puffs into the lungs 2 (two) times daily for 2 weeks or until cough and wheeze free. (Patient taking differently: Inhale 2 puffs into the lungs 2 (two) times daily. As of 04/03/21 patient stated that she had not filled this prescription.) 12 g 11   losartan-hydrochlorothiazide (HYZAAR)  100-25 MG tablet Take 1 tablet by mouth daily. 90 tablet 3   meloxicam (MOBIC) 15 MG tablet Take 1 tablet (15 mg total) by mouth daily with meals 30 tablet 2   Misc Natural Products (DAILY HERBS IMMUNE DEFENSE PO) Take by  mouth.     NON FORMULARY Fiber gummy, 3 gummies per day     pantoprazole (PROTONIX) 40 MG tablet pantoprazole 40 mg tablet,delayed release  TK 1 T PO D     Semaglutide-Weight Management (WEGOVY) 2.4 MG/0.75ML SOAJ Inject 2.4 mg into the skin once a week. 3 mL 0   vitamin B-12 (CYANOCOBALAMIN) 500 MCG tablet 400-500 mcg qd otc supplement     No current facility-administered medications on file prior to visit.    Allergies  Allergen Reactions   Molds & Smuts    Morphine And Related Nausea Only   Latex Rash    Pt states she is not sure if she is allergic to latex or adhesives. Bandaids bother her if they are left on too long.   Wound Dressing Adhesive Rash    Social History   Socioeconomic History   Marital status: Married    Spouse name: Not on file   Number of children: Not on file   Years of education: Not on file   Highest education level: Not on file  Occupational History   Occupation: Educator   Tobacco Use   Smoking status: Never   Smokeless tobacco: Never  Vaping Use   Vaping Use: Never used  Substance and Sexual Activity   Alcohol use: Yes    Comment: occasionally, once per month   Drug use: No   Sexual activity: Yes    Birth control/protection: Surgical    Comment: tubal ligation  Other Topics Concern   Not on file  Social History Narrative   Lives at home with husband   Right handed   Caffeine: 1 cup/day   Social Determinants of Health   Financial Resource Strain: Not on file  Food Insecurity: Not on file  Transportation Needs: Not on file  Physical Activity: Not on file  Stress: Not on file  Social Connections: Not on file  Intimate Partner Violence: Not on file    Family History  Problem Relation Age of Onset   Asthma Other     Heart failure Other    Hypertension Other    Hypertension Mother    Hypertension Father    Heart disease Father    Alcoholism Father     Past Surgical History:  Procedure Laterality Date   CESAREAN SECTION  1998   COLONOSCOPY  08/19/2020   OVARIAN CYST REMOVAL  1991   ROBOTIC ASSISTED LAPAROSCOPIC HYSTERECTOMY AND SALPINGECTOMY Bilateral 04/12/2021   Procedure: XI ROBOTIC ASSISTED LAPAROSCOPIC HYSTERECTOMY AND BILATERAL SALPINGECTOMY;  Surgeon: Drema Dallas, DO;  Location: [facility];  Service: Gynecology;  Laterality: Bilateral;   TUBAL LIGATION  2008   WISDOM TOOTH EXTRACTION  1986    ROS: Review of Systems Negative except as stated above  PHYSICAL EXAM: LMP 03/16/2021 (Exact Date)   Physical Exam  {female adult master:310786} {female adult master:310785}     Latest Ref Rng & Units 11/23/2021   12:36 PM 05/29/2021    9:57 AM 04/10/2021   11:27 AM  CMP  Glucose 70 - 99 mg/dL 85  84  82   BUN 6 - 24 mg/dL '17  12  12   '$ Creatinine 0.57 - 1.00 mg/dL 0.84  0.94  0.91   Sodium 134 - 144 mmol/L 138  139  135   Potassium 3.5 - 5.2 mmol/L 4.3  3.9  3.7   Chloride 96 - 106 mmol/L 100  101  105   CO2 20 - 29 mmol/L 19  21  24   Calcium 8.7 - 10.2 mg/dL 9.4  9.2  8.7   Total Protein 6.0 - 8.5 g/dL 6.8  6.9    Total Bilirubin 0.0 - 1.2 mg/dL 0.4  <0.2    Alkaline Phos 44 - 121 IU/L 64  73    AST 0 - 40 IU/L 14  15    ALT 0 - 32 IU/L 14  9     Lipid Panel     Component Value Date/Time   CHOL 194 11/23/2021 1236   TRIG 62 11/23/2021 1236   HDL 59 11/23/2021 1236   CHOLHDL 3.3 11/23/2021 1236   LDLCALC 124 (H) 11/23/2021 1236    CBC    Component Value Date/Time   WBC 4.7 04/10/2021 1127   RBC 4.62 04/10/2021 1127   HGB 14.1 04/10/2021 1127   HGB 12.2 03/15/2020 1033   HCT 40.8 04/10/2021 1127   HCT 39.6 07/28/2020 0920   PLT 270 04/10/2021 1127   PLT 313 03/15/2020 1033   MCV 88.3 04/10/2021 1127   MCV 85 03/15/2020 1033   MCH 30.5 04/10/2021  1127   MCHC 34.6 04/10/2021 1127   RDW 13.8 04/10/2021 1127   RDW 15.3 03/15/2020 1033   LYMPHSABS 1.9 03/15/2020 1033   EOSABS 0.2 03/15/2020 1033   BASOSABS 0.1 03/15/2020 1033    ASSESSMENT AND PLAN:  There are no diagnoses linked to this encounter.   Patient was given the opportunity to ask questions.  Patient verbalized understanding of the plan and was able to repeat key elements of the plan. Patient was given clear instructions to go to Emergency Department or return to medical center if symptoms don't improve, worsen, or new problems develop.The patient verbalized understanding.   No orders of the defined types were placed in this encounter.    Requested Prescriptions    No prescriptions requested or ordered in this encounter    No follow-ups on file.  Camillia Herter, NP

## 2022-01-03 ENCOUNTER — Ambulatory Visit: Payer: BC Managed Care – PPO | Admitting: Family

## 2022-01-03 ENCOUNTER — Other Ambulatory Visit (HOSPITAL_BASED_OUTPATIENT_CLINIC_OR_DEPARTMENT_OTHER): Payer: Self-pay

## 2022-01-03 ENCOUNTER — Encounter: Payer: Self-pay | Admitting: Family

## 2022-01-03 VITALS — BP 129/86 | HR 60 | Temp 98.3°F | Resp 16 | Ht 65.0 in | Wt 245.0 lb

## 2022-01-03 DIAGNOSIS — H6693 Otitis media, unspecified, bilateral: Secondary | ICD-10-CM

## 2022-01-03 DIAGNOSIS — J3489 Other specified disorders of nose and nasal sinuses: Secondary | ICD-10-CM

## 2022-01-03 DIAGNOSIS — R42 Dizziness and giddiness: Secondary | ICD-10-CM

## 2022-01-03 MED ORDER — MECLIZINE HCL 12.5 MG PO TABS
12.5000 mg | ORAL_TABLET | Freq: Three times a day (TID) | ORAL | 0 refills | Status: DC | PRN
  Filled 2022-01-03: qty 30, 10d supply, fill #0

## 2022-01-03 MED ORDER — AMOXICILLIN-POT CLAVULANATE 875-125 MG PO TABS
1.0000 | ORAL_TABLET | Freq: Two times a day (BID) | ORAL | 0 refills | Status: AC
  Filled 2022-01-03: qty 14, 7d supply, fill #0

## 2022-01-03 NOTE — Progress Notes (Unsigned)
Pt presents for ear pain  -going on for about a week -dizziness and off balance  -sharp pain shoots through ear intermittently  -frontal head pressure

## 2022-01-09 ENCOUNTER — Other Ambulatory Visit (HOSPITAL_BASED_OUTPATIENT_CLINIC_OR_DEPARTMENT_OTHER): Payer: Self-pay

## 2022-01-10 ENCOUNTER — Other Ambulatory Visit (HOSPITAL_BASED_OUTPATIENT_CLINIC_OR_DEPARTMENT_OTHER): Payer: Self-pay

## 2022-01-10 ENCOUNTER — Ambulatory Visit (INDEPENDENT_AMBULATORY_CARE_PROVIDER_SITE_OTHER): Payer: BC Managed Care – PPO | Admitting: Family Medicine

## 2022-01-10 ENCOUNTER — Other Ambulatory Visit: Payer: Self-pay

## 2022-01-10 VITALS — BP 125/84 | HR 78 | Temp 99.1°F | Resp 16 | Wt 242.6 lb

## 2022-01-10 DIAGNOSIS — R062 Wheezing: Secondary | ICD-10-CM | POA: Diagnosis not present

## 2022-01-10 DIAGNOSIS — Z20822 Contact with and (suspected) exposure to covid-19: Secondary | ICD-10-CM | POA: Diagnosis not present

## 2022-01-10 DIAGNOSIS — J4531 Mild persistent asthma with (acute) exacerbation: Secondary | ICD-10-CM | POA: Diagnosis not present

## 2022-01-10 DIAGNOSIS — R059 Cough, unspecified: Secondary | ICD-10-CM | POA: Diagnosis not present

## 2022-01-10 LAB — POCT INFLUENZA A/B
Influenza A, POC: POSITIVE — AB
Influenza B, POC: NEGATIVE

## 2022-01-10 MED ORDER — PREDNISONE 50 MG PO TABS
50.0000 mg | ORAL_TABLET | Freq: Every day | ORAL | 0 refills | Status: DC
  Filled 2022-01-10: qty 5, 5d supply, fill #0

## 2022-01-10 MED ORDER — FLUTICASONE PROPIONATE HFA 110 MCG/ACT IN AERO
2.0000 | INHALATION_SPRAY | Freq: Two times a day (BID) | RESPIRATORY_TRACT | 12 refills | Status: DC
  Filled 2022-01-10: qty 12, 30d supply, fill #0

## 2022-01-10 NOTE — Progress Notes (Signed)
Patient c/o being expose to Covid and influenza. Patient said she has been wheezing for a couple a days.

## 2022-01-11 ENCOUNTER — Encounter: Payer: Self-pay | Admitting: Family Medicine

## 2022-01-11 ENCOUNTER — Other Ambulatory Visit (HOSPITAL_BASED_OUTPATIENT_CLINIC_OR_DEPARTMENT_OTHER): Payer: Self-pay

## 2022-01-11 NOTE — Progress Notes (Signed)
Established Patient Office Visit  Subjective    Patient ID: [name] N Salser, female    DOB: [birth year]  Age: 51 y.o. MRN: [id number]  CC:  Chief Complaint  Patient presents with   Telehealth Consent    HPI [name] N Zuk presents with complaint of cough and wheezing. She reports that she has been exposed to the flu and covid at work. She denies fever/chills.    Outpatient Encounter Medications as of 01/10/2022  Medication Sig   albuterol (VENTOLIN HFA) 108 (90 Base) MCG/ACT inhaler Inhale 2 puffs into the lungs every 6 (six) hours as needed for wheezing or shortness of breath.   [EXPIRED] amoxicillin-clavulanate (AUGMENTIN) 875-125 MG tablet Take 1 tablet by mouth 2 (two) times daily for 7 days.   cetirizine (ZYRTEC) 10 MG tablet Take 1 tablet (10 mg total) by mouth daily.   Cholecalciferol (VITAMIN D) 125 MCG (5000 UT) CAPS Take 1 capsule Monday through Friday   cyclobenzaprine (FLEXERIL) 10 MG tablet Take 1 tablet (10 mg total) by mouth at bedtime as needed for spasm   famotidine (PEPCID) 40 MG tablet Take 1 tablet (40 mg total) by mouth 2 (two) times daily.   fluticasone (FLONASE) 50 MCG/ACT nasal spray Place 2 sprays into both nostrils daily as needed for allergies or rhinitis.   fluticasone (FLOVENT HFA) 110 MCG/ACT inhaler Inhale 2 puffs into the lungs 2 (two) times daily.   losartan-hydrochlorothiazide (HYZAAR) 100-25 MG tablet Take 1 tablet by mouth daily.   meclizine (ANTIVERT) 12.5 MG tablet Take 1 tablet (12.5 mg total) by mouth 3 (three) times daily as needed for dizziness.   meloxicam (MOBIC) 15 MG tablet Take 1 tablet (15 mg total) by mouth daily with meals   Misc Natural Products (DAILY HERBS IMMUNE DEFENSE PO) Take by mouth.   NON FORMULARY Fiber gummy, 3 gummies per day   pantoprazole (PROTONIX) 40 MG tablet pantoprazole 40 mg tablet,delayed release  TK 1 T PO D   predniSONE (DELTASONE) 50 MG tablet Take 1 tablet (50 mg total) by mouth daily with breakfast.    Semaglutide-Weight Management (WEGOVY) 2.4 MG/0.75ML SOAJ Inject 2.4 mg into the skin once a week.   vitamin B-12 (CYANOCOBALAMIN) 500 MCG tablet 400-500 mcg qd otc supplement   [DISCONTINUED] fluticasone (FLOVENT HFA) 110 MCG/ACT inhaler For asthma flare, inhale 2 puffs into the lungs 2 (two) times daily for 2 weeks or until cough and wheeze free. (Patient taking differently: Inhale 2 puffs into the lungs 2 (two) times daily. As of 04/03/21 patient stated that she had not filled this prescription.)   No facility-administered encounter medications on file as of 01/10/2022.    Past Medical History:  Diagnosis Date   ADHD    Anxiety    Asthma    Patient follows with [facility]. LOV 03/24/21 with FNP Gareth Morgan.As of 04/03/21, pt is using her inhaler 1 -2 times per day per pt.   Back pain    Constipation    COVID 03/09/2021   Patient took antiviral. As of 04/03/21, pt states that all symptoms are resolved.   GERD (gastroesophageal reflux disease)    Hypertension    IBS (irritable bowel syndrome)    Joint pain    Lower extremity edema    Obesity    Pre-diabetes    07/28/20 A1C 6.4 / Patient is taking GEXBMW.   Sleep apnea    05/03/20 sleep study = moderate to severe sleep apnea. Wears CPAP.  Wears glasses     Past Surgical History:  Procedure Laterality Date   CESAREAN SECTION  1998   COLONOSCOPY  08/19/2020   OVARIAN CYST REMOVAL  1991   ROBOTIC ASSISTED LAPAROSCOPIC HYSTERECTOMY AND SALPINGECTOMY Bilateral 04/12/2021   Procedure: XI ROBOTIC ASSISTED LAPAROSCOPIC HYSTERECTOMY AND BILATERAL SALPINGECTOMY;  Surgeon: Drema Dallas, DO;  Location: [facility];  Service: Gynecology;  Laterality: Bilateral;   TUBAL LIGATION  2008   WISDOM TOOTH EXTRACTION  1986    Family History  Problem Relation Age of Onset   Asthma Other    Heart failure Other    Hypertension Other    Hypertension Mother    Hypertension Father    Heart disease Father     Alcoholism Father     Social History   Socioeconomic History   Marital status: Married    Spouse name: Not on file   Number of children: Not on file   Years of education: Not on file   Highest education level: Not on file  Occupational History   Occupation: Educator   Tobacco Use   Smoking status: Never    Passive exposure: Never   Smokeless tobacco: Never  Vaping Use   Vaping Use: Never used  Substance and Sexual Activity   Alcohol use: Yes    Comment: occasionally, once per month   Drug use: No   Sexual activity: Yes    Birth control/protection: Surgical    Comment: tubal ligation  Other Topics Concern   Not on file  Social History Narrative   Lives at home with husband   Right handed   Caffeine: 1 cup/day   Social Determinants of Health   Financial Resource Strain: Not on file  Food Insecurity: Not on file  Transportation Needs: Not on file  Physical Activity: Not on file  Stress: Not on file  Social Connections: Not on file  Intimate Partner Violence: Not on file    Review of Systems  Constitutional:  Negative for chills and fever.  Respiratory:  Positive for cough and wheezing. Negative for shortness of breath.   All other systems reviewed and are negative.       Objective    BP 125/84   Pulse 78   Temp 99.1 F (37.3 C) (Oral)   Resp 16   Wt 242 lb 9.6 oz (110 kg)   LMP 03/16/2021 (Exact Date)   SpO2 95%   BMI 40.37 kg/m   Physical Exam Vitals and nursing note reviewed.  Constitutional:      General: She is not in acute distress. Cardiovascular:     Rate and Rhythm: Normal rate and regular rhythm.  Pulmonary:     Effort: Pulmonary effort is normal. No respiratory distress.     Breath sounds: Wheezing present.  Neurological:     General: No focal deficit present.     Mental Status: She is alert and oriented to person, place, and time.         Assessment & Plan:   1. Mild persistent asthma with acute exacerbation Flovent and  prednisone prescribed. monitor  2. Exposure to COVID-19 virus Results pending - POCT Influenza A/B     Return if symptoms worsen or fail to improve.   Becky Sax, MD

## 2022-01-12 ENCOUNTER — Ambulatory Visit: Payer: BC Managed Care – PPO

## 2022-01-12 NOTE — Telephone Encounter (Signed)
Flovent prescribed by Dorna Mai, MD on 01/10/2022. Consult with the same for next steps.

## 2022-01-17 ENCOUNTER — Other Ambulatory Visit (HOSPITAL_BASED_OUTPATIENT_CLINIC_OR_DEPARTMENT_OTHER): Payer: Self-pay

## 2022-01-17 ENCOUNTER — Encounter (HOSPITAL_BASED_OUTPATIENT_CLINIC_OR_DEPARTMENT_OTHER): Payer: Self-pay

## 2022-01-22 ENCOUNTER — Encounter (INDEPENDENT_AMBULATORY_CARE_PROVIDER_SITE_OTHER): Payer: Self-pay | Admitting: Family Medicine

## 2022-01-22 ENCOUNTER — Other Ambulatory Visit (HOSPITAL_BASED_OUTPATIENT_CLINIC_OR_DEPARTMENT_OTHER): Payer: Self-pay

## 2022-01-22 ENCOUNTER — Ambulatory Visit (INDEPENDENT_AMBULATORY_CARE_PROVIDER_SITE_OTHER): Payer: BC Managed Care – PPO | Admitting: Family Medicine

## 2022-01-22 VITALS — BP 125/79 | HR 61 | Temp 99.0°F | Ht 65.0 in | Wt 244.0 lb

## 2022-01-22 DIAGNOSIS — E669 Obesity, unspecified: Secondary | ICD-10-CM | POA: Diagnosis not present

## 2022-01-22 DIAGNOSIS — E7849 Other hyperlipidemia: Secondary | ICD-10-CM

## 2022-01-22 DIAGNOSIS — Z6841 Body Mass Index (BMI) 40.0 and over, adult: Secondary | ICD-10-CM

## 2022-01-22 DIAGNOSIS — E7800 Pure hypercholesterolemia, unspecified: Secondary | ICD-10-CM

## 2022-01-22 DIAGNOSIS — E559 Vitamin D deficiency, unspecified: Secondary | ICD-10-CM

## 2022-01-22 MED ORDER — WEGOVY 2.4 MG/0.75ML ~~LOC~~ SOAJ
2.4000 mg | SUBCUTANEOUS | 0 refills | Status: DC
  Filled 2022-01-22: qty 3, 28d supply, fill #0

## 2022-02-01 NOTE — Progress Notes (Signed)
Chief Complaint:   OBESITY Pamela [name] is here to discuss her progress with her obesity treatment plan along with follow-up of her obesity related diagnoses. Pamela [name] is on the Category 2 Plan + 100 calories and states she is following her eating plan approximately 85% of the time. Pamela [name] states she is doing 0 minutes 0 times per week.  Today's visit was #: 28 Starting weight: 333 lbs Starting date: 03/15/2020 Today's weight: 244 lbs Today's date: 01/22/2022 Total lbs lost to date: 89 Total lbs lost since last in-office visit: 1  Interim History: Pamela [name] continues to work on portion control and weight loss.  She notes she is doing well with her meal plan on weekdays but she struggles more on weekends.  Subjective:   1. Hyperlipidemia, pure Pamela [name]'s last LDL was elevated.  She is seeing her PCP to discuss treatment options.  2. Vitamin D deficiency Pamela [name]'s last vitamin D was elevated and she stopped all vitamin D's as instructed.  Assessment/Plan:   1. Hyperlipidemia, pure Pamela [name] will continue to work on her diet and we discussed options such as calcium scoring etc.  2. Vitamin D deficiency Pamela [name] will continue to discontinue vitamin D, and we will recheck her labs in 2 months.  3. Obesity, Current BMI 40.7 Pamela [name] is currently in the action stage of change. As such, her goal is to continue with weight loss efforts. She has agreed to the Category 2 Plan.   We will refill Wegovy for 1 month.   - Semaglutide-Weight Management (WEGOVY) 2.4 MG/0.75ML SOAJ; Inject 2.4 mg into the skin once a week.  Dispense: 3 mL; Refill: 0  Behavioral modification strategies: increasing lean protein intake.  Pamela [name] has agreed to follow-up with our clinic in 3 to 4 weeks. She was informed of the importance of frequent follow-up visits to maximize her success with intensive lifestyle modifications for her multiple health conditions.   Objective:   Blood pressure 125/79, pulse 61, temperature 99 F (37.2 C),  height '5\' 5"'$  (1.651 m), weight 244 lb (110.7 kg), last menstrual period 03/16/2021, SpO2 99 %. Body mass index is 40.6 kg/m.  General: Cooperative, alert, well developed, in no acute distress. HEENT: Conjunctivae and lids unremarkable. Cardiovascular: Regular rhythm.  Lungs: Normal work of breathing. Neurologic: No focal deficits.   Lab Results  Component Value Date   CREATININE 0.84 11/23/2021   BUN 17 11/23/2021   NA 138 11/23/2021   K 4.3 11/23/2021   CL 100 11/23/2021   CO2 19 (L) 11/23/2021   Lab Results  Component Value Date   ALT 14 11/23/2021   AST 14 11/23/2021   ALKPHOS 64 11/23/2021   BILITOT 0.4 11/23/2021   Lab Results  Component Value Date   HGBA1C 5.6 11/23/2021   HGBA1C 5.5 05/29/2021   HGBA1C 6.4 (H) 07/28/2020   HGBA1C 6.4 (H) 03/15/2020   Lab Results  Component Value Date   INSULIN 11.0 11/23/2021   INSULIN 13.1 05/29/2021   INSULIN 15.0 07/28/2020   INSULIN 30.0 (H) 03/15/2020   Lab Results  Component Value Date   TSH 2.170 03/15/2020   Lab Results  Component Value Date   CHOL 194 11/23/2021   HDL 59 11/23/2021   LDLCALC 124 (H) 11/23/2021   TRIG 62 11/23/2021   CHOLHDL 3.3 11/23/2021   Lab Results  Component Value Date   VD25OH 95.2 11/23/2021   VD25OH 78.1 05/29/2021   VD25OH 68.5 07/28/2020   Lab Results  Component Value Date   WBC  4.7 04/10/2021   HGB 14.1 04/10/2021   HCT 40.8 04/10/2021   MCV 88.3 04/10/2021   PLT 270 04/10/2021   Lab Results  Component Value Date   IRON 63 07/28/2020   TIBC 274 07/28/2020   FERRITIN 57 07/28/2020   Attestation Statements:   Reviewed by clinician on day of visit: allergies, medications, problem list, medical history, surgical history, family history, social history, and previous encounter notes.   I, Trixie Dredge, am acting as transcriptionist for Dennard Nip, MD.  I have reviewed the above documentation for accuracy and completeness, and I agree with the above. -  Dennard Nip, MD

## 2022-02-06 ENCOUNTER — Other Ambulatory Visit: Payer: Self-pay

## 2022-02-06 ENCOUNTER — Encounter: Payer: Self-pay | Admitting: Internal Medicine

## 2022-02-06 ENCOUNTER — Ambulatory Visit: Payer: BC Managed Care – PPO | Admitting: Internal Medicine

## 2022-02-06 ENCOUNTER — Other Ambulatory Visit (HOSPITAL_BASED_OUTPATIENT_CLINIC_OR_DEPARTMENT_OTHER): Payer: Self-pay

## 2022-02-06 VITALS — BP 134/82 | HR 91 | Temp 97.7°F | Resp 16 | Wt 239.4 lb

## 2022-02-06 DIAGNOSIS — J4530 Mild persistent asthma, uncomplicated: Secondary | ICD-10-CM | POA: Diagnosis not present

## 2022-02-06 DIAGNOSIS — K219 Gastro-esophageal reflux disease without esophagitis: Secondary | ICD-10-CM

## 2022-02-06 DIAGNOSIS — J3089 Other allergic rhinitis: Secondary | ICD-10-CM

## 2022-02-06 DIAGNOSIS — R040 Epistaxis: Secondary | ICD-10-CM

## 2022-02-06 MED ORDER — ALBUTEROL SULFATE HFA 108 (90 BASE) MCG/ACT IN AERS
2.0000 | INHALATION_SPRAY | Freq: Four times a day (QID) | RESPIRATORY_TRACT | 1 refills | Status: AC | PRN
  Filled 2022-02-06: qty 6.7, 25d supply, fill #0

## 2022-02-06 MED ORDER — PULMICORT FLEXHALER 180 MCG/ACT IN AEPB
INHALATION_SPRAY | RESPIRATORY_TRACT | 3 refills | Status: AC
  Filled 2022-02-06 – 2022-02-07 (×2): qty 1, 30d supply, fill #0
  Filled 2022-03-08: qty 1, 30d supply, fill #1

## 2022-02-06 MED ORDER — AMOXICILLIN-POT CLAVULANATE 875-125 MG PO TABS
1.0000 | ORAL_TABLET | Freq: Two times a day (BID) | ORAL | 0 refills | Status: AC
  Filled 2022-02-06: qty 10, 5d supply, fill #0

## 2022-02-06 MED ORDER — MONTELUKAST SODIUM 10 MG PO TABS
10.0000 mg | ORAL_TABLET | Freq: Every day | ORAL | 5 refills | Status: DC
  Filled 2022-02-06: qty 30, 30d supply, fill #0
  Filled 2022-03-08: qty 30, 30d supply, fill #1
  Filled 2022-08-19: qty 30, 30d supply, fill #2

## 2022-02-06 MED ORDER — FLUTICASONE PROPIONATE 50 MCG/ACT NA SUSP
2.0000 | Freq: Every day | NASAL | 5 refills | Status: AC | PRN
  Filled 2022-02-06: qty 16, 30d supply, fill #0

## 2022-02-06 NOTE — Patient Instructions (Addendum)
Mild Persistent Asthma - MDI technique discussed.  - Maintenance: restart Singulair '10mg'$  daily.  - With respiratory illness or asthma flare up such as now, start Pulmicort Flexhaler 52mg 1 puff twice daily for 2 weeks.   If not improved or have worsening respiratory symptoms, please call me and I will call in a prednisone pack. - Rescue inhaler: Albuterol 2 puffs via spacer or 1 vial via nebulizer every 4-6 hours as needed for respiratory symptoms of cough, shortness of breath, or wheezing Asthma control goals:  Full participation in all desired activities (may need albuterol before activity) Albuterol use two times or less a week on average (not counting use with activity) Cough interfering with sleep two times or less a month Oral steroids no more than once a year No hospitalizations   Allergic rhinitis Acute bacterial rhinosinusitis - Start Augmentin '875mg'$  twice daily for 5 days. Take a probiotic with this.  - Positive skin test 08/2016: mold, cat, and dog - Avoidance measures discussed. - Restart nasal saline rinses before nose sprays such as with Neilmed Sinus Rinse.  Use distilled water.   - Restart Flonase 1-2 sprays each nostril daily. Aim upward and outward. - Restart Zyrtec 10 mg daily.  - Restart Singulair '10mg'$  daily.  Black box warning discussed.  Stop if there are any mood/behavioral changes. - Consider allergy shots as long term control of your symptoms by teaching your immune system to be more tolerant of your allergy triggers   Reflux -Continue dietary lifestyle modifications as listed below -Continue pantoprazole 40 mg once a day as previously prescribed   Epistaxis -Pinch both nostrils while leaning forward for at least 5 minutes before checking to see if the bleeding has stopped. If bleeding is not controlled within 5-10 minutes apply a cotton ball soaked with oxymetazoline (Afrin) to the bleeding nostril for a few seconds.  -If the problem persists or worsens a  referral to ENT for further evaluation may be necessary.

## 2022-02-06 NOTE — Progress Notes (Signed)
FOLLOW UP Date of Service/Encounter:  02/06/22   Subjective:  Pamela [name] (DOB: [birth year]) is a 52 y.o. female who returns to the [facility] on 02/06/2022 for follow up for an acute visit.   History obtained from: chart review and patient.  Last visit 03/24/2021 for mild persistent asthma, allergic rhinoconjunctivitis, reflux and epistaxis.  Reports she was well-controlled at that visit so was told to do Singulair daily and Flovent for flareups.  She was also told to continue her Protonix.  If epistaxis recurs, she is to use Afrin and refer her to ENT.   Around the middle of this month, she had the Flu and since then has not been doing well.  She also had an ear infection at the beginning of this month and was given oral prednisone, no asthma flare up at the time.  About 5 days ago, she lost her voice and had increased mucous production.  Afterwards she developed coughing with clear mucus production but it started getting better after 2 to 3 days.  However the past 2 days the cough has again worsened with lots of mucus production and some shortness of breath/wheezing requiring use of Albuterol.  She has also noticed she is having a lot of congestion and drainage with clear-yellow mucous.  She has had some night sweats but the highest temperature recorded was 100.  She did try to restart her Flovent but the pharmacy told her that this was not covered.  She reports that prior to this episode, her asthma was doing really well and she was just taking Singulair daily.  She has been off the Singulair for a little while also because she ran out of refills.   She was also taking Flonase regularly but has held off the past few days as she has been sick. She also uses Zyrtec 10 mg daily as needed and has been using it regularly during this illness.  She has had an episode of nosebleed but that is because her nose has been really irritated from this illness but prior to that her  nosebleeds had stopped  Past Medical History: Past Medical History:  Diagnosis Date   ADHD    Anxiety    Asthma    Patient follows with [facility]. LOV 03/24/21 with FNP Gareth Morgan.As of 04/03/21, pt is using her inhaler 1 -2 times per day per pt.   Back pain    Constipation    COVID 03/09/2021   Patient took antiviral. As of 04/03/21, pt states that all symptoms are resolved.   GERD (gastroesophageal reflux disease)    Hypertension    IBS (irritable bowel syndrome)    Joint pain    Lower extremity edema    Obesity    Pre-diabetes    07/28/20 A1C 6.4 / Patient is taking XNATFT.   Sleep apnea    05/03/20 sleep study = moderate to severe sleep apnea. Wears CPAP.   Wears glasses     Objective:  BP 134/82   Pulse 91   Temp 97.7 F (36.5 C) (Temporal)   Resp 16   Wt 239 lb 6.4 oz (108.6 kg)   LMP 03/16/2021 (Exact Date)   SpO2 98%   BMI 39.84 kg/m  Body mass index is 39.84 kg/m. Physical Exam: GEN: alert, well developed HEENT: clear conjunctiva, TM grey and translucent, nose with moderate inferior turbinate hypertrophy, pink nasal mucosa, clear rhinorrhea, + cobblestoning HEART: regular rate and rhythm,  no murmur LUNGS: clear to auscultation bilaterally, + wet mucoid coughing, unlabored respiration SKIN: no rashes or lesions   Assessment/Plan  Mild Persistent Asthma - Mild exacerbation likely due to illness.  Will start ICS Pulmicort as discussed below.  - MDI technique discussed. Will obtain spirometry at next visit.  - Maintenance: restart Singulair '10mg'$  daily.  - With respiratory illness or asthma flare up such as now, start Pulmicort Flexhaler 57mg 1 puff twice daily for 2 weeks.   If not improved or have worsening respiratory symptoms, please call me and I will call in a prednisone pack. - Rescue inhaler: Albuterol 2 puffs via spacer or 1 vial via nebulizer every 4-6 hours as needed for respiratory symptoms of cough, shortness of breath,  or wheezing Asthma control goals:  Full participation in all desired activities (may need albuterol before activity) Albuterol use two times or less a week on average (not counting use with activity) Cough interfering with sleep two times or less a month Oral steroids no more than once a year No hospitalizations   Allergic rhinitis Acute bacterial rhinosinusitis - Start Augmentin '875mg'$  twice daily for 5 days. Take a probiotic with this.  - Positive skin test 08/2016: mold, cat, and dog - Avoidance measures discussed. - Restart nasal saline rinses before nose sprays such as with Neilmed Sinus Rinse.  Use distilled water.   - Restart Flonase 1-2 sprays each nostril daily. Aim upward and outward. - Restart Zyrtec 10 mg daily.  - Restart Singulair '10mg'$  daily.  Black box warning discussed.  Stop if there are any mood/behavioral changes. - Consider allergy shots as long term control of your symptoms by teaching your immune system to be more tolerant of your allergy triggers   Reflux -Continue dietary lifestyle modifications as listed below -Continue pantoprazole 40 mg once a day as previously prescribed   Epistaxis -Pinch both nostrils while leaning forward for at least 5 minutes before checking to see if the bleeding has stopped. If bleeding is not controlled within 5-10 minutes apply a cotton ball soaked with oxymetazoline (Afrin) to the bleeding nostril for a few seconds.  -If the problem persists or worsens a referral to ENT for further evaluation may be necessary.   Return in about 3 months (around 05/08/2022). PHarlon Flor MD  [facility]

## 2022-02-07 ENCOUNTER — Other Ambulatory Visit (HOSPITAL_COMMUNITY): Payer: Self-pay

## 2022-02-07 ENCOUNTER — Other Ambulatory Visit (HOSPITAL_BASED_OUTPATIENT_CLINIC_OR_DEPARTMENT_OTHER): Payer: Self-pay

## 2022-02-14 ENCOUNTER — Ambulatory Visit (INDEPENDENT_AMBULATORY_CARE_PROVIDER_SITE_OTHER): Payer: BC Managed Care – PPO | Admitting: Physician Assistant

## 2022-02-15 ENCOUNTER — Other Ambulatory Visit (HOSPITAL_BASED_OUTPATIENT_CLINIC_OR_DEPARTMENT_OTHER): Payer: Self-pay

## 2022-02-15 ENCOUNTER — Ambulatory Visit (INDEPENDENT_AMBULATORY_CARE_PROVIDER_SITE_OTHER): Payer: BC Managed Care – PPO | Admitting: Physician Assistant

## 2022-02-15 ENCOUNTER — Encounter (INDEPENDENT_AMBULATORY_CARE_PROVIDER_SITE_OTHER): Payer: Self-pay | Admitting: Physician Assistant

## 2022-02-15 VITALS — BP 136/83 | HR 63 | Temp 98.9°F | Ht 65.0 in | Wt 243.0 lb

## 2022-02-15 DIAGNOSIS — Z6841 Body Mass Index (BMI) 40.0 and over, adult: Secondary | ICD-10-CM

## 2022-02-15 DIAGNOSIS — E669 Obesity, unspecified: Secondary | ICD-10-CM

## 2022-02-15 DIAGNOSIS — E66813 Obesity, class 3: Secondary | ICD-10-CM

## 2022-02-15 DIAGNOSIS — R7303 Prediabetes: Secondary | ICD-10-CM

## 2022-02-15 DIAGNOSIS — E559 Vitamin D deficiency, unspecified: Secondary | ICD-10-CM | POA: Diagnosis not present

## 2022-02-15 MED ORDER — WEGOVY 2.4 MG/0.75ML ~~LOC~~ SOAJ
2.4000 mg | SUBCUTANEOUS | 0 refills | Status: DC
  Filled 2022-02-15 – 2022-02-24 (×3): qty 3, 28d supply, fill #0

## 2022-02-22 ENCOUNTER — Other Ambulatory Visit (HOSPITAL_BASED_OUTPATIENT_CLINIC_OR_DEPARTMENT_OTHER): Payer: Self-pay

## 2022-02-22 NOTE — Progress Notes (Signed)
Chief Complaint:   OBESITY Pamela [name] is here to discuss her progress with her obesity treatment plan along with follow-up of her obesity related diagnoses. Pamela [name] is on the Category 2 Plan and states she is following her eating plan approximately 40% of the time. Pamela [name] states she is exercising 0 minutes 0 times per week.  Today's visit was #: 75 Starting weight: 333 lbs Starting date: 03/15/2020 Today's weight: 243 lbs Today's date: 02/15/2022 Total lbs lost to date: 90 lbs Total lbs lost since last in-office visit: 1  Interim History: Pamela [name] has done very well with weight loss, but struggled recently over the holidays and also recently had influenza.  She reports she got completely off track with her nutrition plan.  She reports she actually lost weight due to not eating as she had little appetite while she was sick.  She is getting much better, recovering from influenza, and working to get back on track with her nutrition plan.  She has been on Wegovy 2.4 mg weekly and reports no side effects with this. She does plan to resume gym workouts now that she has recovered from influenza and we specifically discussed working on some strengthening exercises. (Prior authorization for [employer] is through 05/27/2022 per notes)  Subjective:   1. Pre-diabetes Dasja's A1c at 5.6/insulin at 11.0 on 11/23/21--much improved from last year.  Working on decreasing simple carbs, increasing lean protein and exercise to promote healthy weight loss and improved glycemic control.   2. Vitamin D deficiency Vit D level of 95.2 on 11/23/21-supratherapeutic.  All vitamin D stopped at the time.  Denies any side effects and has remained off of all vitamin D  Assessment/Plan:   1. Pre-diabetes Continue Prescribed Nutrition Plan and exercise and Wegovy to promote weight loss and improve her glycemic control.  Recheck labs in 3-4 months.  2. Vitamin D deficiency She should remain off Vit D supplements.  Plan to recheck  Vitamin D level over the next 1-2 months.  3. Obesity, Current BMI 40.5 We will refill Wegovy 2.4 mg  - Semaglutide-Weight Management (WEGOVY) 2.4 MG/0.75ML SOAJ; Inject 2.4 mg into the skin once a week.  Dispense: 3 mL; Refill: 0  Pamela [name] is currently in the action stage of change. As such, her goal is to continue with weight loss efforts. She has agreed to the Category 2 Plan and keeping a food journal and adhering to recommended goals of 1200-1300 calories and 85+ grams of protein daily.   Exercise goals: All adults should avoid inactivity. Some physical activity is better than none, and adults who participate in any amount of physical activity gain some health benefits.  Behavioral modification strategies: increasing lean protein intake, decreasing simple carbohydrates, increasing water intake, meal planning and cooking strategies, keeping healthy foods in the home, and keeping a strict food journal.  Pamela [name] has agreed to follow-up with our clinic in 4 weeks. She was informed of the importance of frequent follow-up visits to maximize her success with intensive lifestyle modifications for her multiple health conditions.   Objective:   Blood pressure 136/83, pulse 63, temperature 98.9 F (37.2 C), height '5\' 5"'$  (1.651 m), weight 243 lb (110.2 kg), last menstrual period 03/16/2021, SpO2 97 %. Body mass index is 40.44 kg/m.  General: Cooperative, alert, well developed, in no acute distress. HEENT: Conjunctivae and lids unremarkable. Cardiovascular: Regular rhythm.  Lungs: Normal work of breathing. Neurologic: No focal deficits.   Lab Results  Component Value Date   CREATININE 0.84 11/23/2021  BUN 17 11/23/2021   NA 138 11/23/2021   K 4.3 11/23/2021   CL 100 11/23/2021   CO2 19 (L) 11/23/2021   Lab Results  Component Value Date   ALT 14 11/23/2021   AST 14 11/23/2021   ALKPHOS 64 11/23/2021   BILITOT 0.4 11/23/2021   Lab Results  Component Value Date   HGBA1C 5.6 11/23/2021    HGBA1C 5.5 05/29/2021   HGBA1C 6.4 (H) 07/28/2020   HGBA1C 6.4 (H) 03/15/2020   Lab Results  Component Value Date   INSULIN 11.0 11/23/2021   INSULIN 13.1 05/29/2021   INSULIN 15.0 07/28/2020   INSULIN 30.0 (H) 03/15/2020   Lab Results  Component Value Date   TSH 2.170 03/15/2020   Lab Results  Component Value Date   CHOL 194 11/23/2021   HDL 59 11/23/2021   LDLCALC 124 (H) 11/23/2021   TRIG 62 11/23/2021   CHOLHDL 3.3 11/23/2021   Lab Results  Component Value Date   VD25OH 95.2 11/23/2021   VD25OH 78.1 05/29/2021   VD25OH 68.5 07/28/2020   Lab Results  Component Value Date   WBC 4.7 04/10/2021   HGB 14.1 04/10/2021   HCT 40.8 04/10/2021   MCV 88.3 04/10/2021   PLT 270 04/10/2021   Lab Results  Component Value Date   IRON 63 07/28/2020   TIBC 274 07/28/2020   FERRITIN 57 07/28/2020   Attestation Statements:   Reviewed by clinician on day of visit: allergies, medications, problem list, medical history, surgical history, family history, social history, and previous encounter notes.  I, Brendell Tyus, am acting as transcriptionist for [employer], PA.  I have reviewed the above documentation for accuracy and completeness, and I agree with the above. -  Kalicia Dufresne,PA-C

## 2022-02-24 ENCOUNTER — Other Ambulatory Visit (HOSPITAL_BASED_OUTPATIENT_CLINIC_OR_DEPARTMENT_OTHER): Payer: Self-pay

## 2022-02-26 ENCOUNTER — Ambulatory Visit (INDEPENDENT_AMBULATORY_CARE_PROVIDER_SITE_OTHER): Payer: BC Managed Care – PPO | Admitting: Family Medicine

## 2022-02-26 VITALS — BP 139/85 | HR 60 | Temp 98.1°F | Resp 16 | Wt 249.2 lb

## 2022-02-26 DIAGNOSIS — Z0000 Encounter for general adult medical examination without abnormal findings: Secondary | ICD-10-CM | POA: Diagnosis not present

## 2022-02-26 DIAGNOSIS — Z114 Encounter for screening for human immunodeficiency virus [HIV]: Secondary | ICD-10-CM

## 2022-02-26 DIAGNOSIS — Z1159 Encounter for screening for other viral diseases: Secondary | ICD-10-CM

## 2022-02-26 DIAGNOSIS — Z130 Encounter for screening for diseases of the blood and blood-forming organs and certain disorders involving the immune mechanism: Secondary | ICD-10-CM

## 2022-02-26 DIAGNOSIS — Z13228 Encounter for screening for other metabolic disorders: Secondary | ICD-10-CM | POA: Diagnosis not present

## 2022-02-26 DIAGNOSIS — Z1329 Encounter for screening for other suspected endocrine disorder: Secondary | ICD-10-CM

## 2022-02-26 DIAGNOSIS — Z13220 Encounter for screening for lipoid disorders: Secondary | ICD-10-CM

## 2022-02-26 NOTE — Progress Notes (Signed)
Patient is here for their CPE Patient has no concerns today Care gaps have been discussed with patient

## 2022-02-27 ENCOUNTER — Encounter: Payer: Self-pay | Admitting: Family Medicine

## 2022-02-27 NOTE — Progress Notes (Signed)
Established Patient Office Visit  Subjective    Patient ID: Pamela [name], female    DOB: 04/03/1970  Age: 52 y.o. MRN: [id number]  CC:  Chief Complaint  Patient presents with   Follow-up    HPI Pamela N Ironside presents for routine annual exam. Patient denies acute complaints or concerns.    Outpatient Encounter Medications as of 02/26/2022  Medication Sig   albuterol (VENTOLIN HFA) 108 (90 Base) MCG/ACT inhaler Inhale 2 puffs into the lungs every 6 (six) hours as needed for wheezing or shortness of breath.   Biotin 10 MG TABS 1 tablet Orally Once a day   budesonide (PULMICORT FLEXHALER) 180 MCG/ACT inhaler With respiratory illness or flare up, start 1 puffs twice daily for 2 weeks.   cetirizine (ZYRTEC) 10 MG tablet Take 1 tablet (10 mg total) by mouth daily.   Cholecalciferol 50 MCG (2000 UT) CAPS 1 capsule Orally Once a day   cyclobenzaprine (FLEXERIL) 10 MG tablet Take 1 tablet (10 mg total) by mouth at bedtime as needed for spasm   famotidine (PEPCID) 40 MG tablet Take 1 tablet (40 mg total) by mouth 2 (two) times daily.   Ferrous Sulfate (IRON) 325 (65 Fe) MG TABS 1 tablet Orally every other day   fluticasone (FLONASE) 50 MCG/ACT nasal spray Place 2 sprays into both nostrils daily as needed for allergies or rhinitis.   losartan-hydrochlorothiazide (HYZAAR) 100-25 MG tablet Take 1 tablet by mouth daily.   meclizine (ANTIVERT) 12.5 MG tablet Take 1 tablet (12.5 mg total) by mouth 3 (three) times daily as needed for dizziness.   meloxicam (MOBIC) 15 MG tablet Take 1 tablet (15 mg total) by mouth daily with meals   Misc Natural Products (DAILY HERBS IMMUNE DEFENSE PO) Take by mouth.   montelukast (SINGULAIR) 10 MG tablet Take 1 tablet (10 mg total) by mouth at bedtime.   NON FORMULARY Fiber gummy, 3 gummies per day   pantoprazole (PROTONIX) 40 MG tablet pantoprazole 40 mg tablet,delayed release  TK 1 T PO D   predniSONE (DELTASONE) 50 MG tablet Take 1 tablet (50 mg total)  by mouth daily with breakfast.   Semaglutide-Weight Management (WEGOVY) 2.4 MG/0.75ML SOAJ Inject 2.4 mg into the skin once a week.   vitamin B-12 (CYANOCOBALAMIN) 500 MCG tablet 400-500 mcg qd otc supplement   No facility-administered encounter medications on file as of 02/26/2022.    Past Medical History:  Diagnosis Date   ADHD    Anxiety    Asthma    Patient follows with [facility]. LOV 03/24/21 with FNP Gareth Morgan.As of 04/03/21, pt is using her inhaler 1 -2 times per day per pt.   Back pain    Constipation    COVID 03/09/2021   Patient took antiviral. As of 04/03/21, pt states that all symptoms are resolved.   GERD (gastroesophageal reflux disease)    Hypertension    IBS (irritable bowel syndrome)    Joint pain    Lower extremity edema    Obesity    Pre-diabetes    07/28/20 A1C 6.4 / Patient is taking DXAJOI.   Sleep apnea    05/03/20 sleep study = moderate to severe sleep apnea. Wears CPAP.   Wears glasses     Past Surgical History:  Procedure Laterality Date   CESAREAN SECTION  1998   COLONOSCOPY  08/19/2020   OVARIAN CYST REMOVAL  1991   ROBOTIC ASSISTED LAPAROSCOPIC HYSTERECTOMY AND SALPINGECTOMY Bilateral 04/12/2021  Procedure: XI ROBOTIC ASSISTED LAPAROSCOPIC HYSTERECTOMY AND BILATERAL SALPINGECTOMY;  Surgeon: Drema Dallas, DO;  Location: [facility];  Service: Gynecology;  Laterality: Bilateral;   TUBAL LIGATION  2008   WISDOM TOOTH EXTRACTION  1986    Family History  Problem Relation Age of Onset   Asthma Other    Heart failure Other    Hypertension Other    Hypertension Mother    Hypertension Father    Heart disease Father    Alcoholism Father     Social History   Socioeconomic History   Marital status: Married    Spouse name: Not on file   Number of children: Not on file   Years of education: Not on file   Highest education level: Not on file  Occupational History   Occupation: Educator   Tobacco Use    Smoking status: Never    Passive exposure: Never   Smokeless tobacco: Never  Vaping Use   Vaping Use: Never used  Substance and Sexual Activity   Alcohol use: Yes    Comment: occasionally, once per month   Drug use: No   Sexual activity: Yes    Birth control/protection: Surgical    Comment: tubal ligation  Other Topics Concern   Not on file  Social History Narrative   Lives at home with husband   Right handed   Caffeine: 1 cup/day   Social Determinants of Health   Financial Resource Strain: Not on file  Food Insecurity: Not on file  Transportation Needs: Not on file  Physical Activity: Not on file  Stress: Not on file  Social Connections: Not on file  Intimate Partner Violence: Not on file    Review of Systems  All other systems reviewed and are negative.       Objective    BP 139/85   Pulse 60   Temp 98.1 F (36.7 C) (Oral)   Resp 16   Wt 249 lb 3.2 oz (113 kg)   LMP 03/16/2021 (Exact Date)   SpO2 99%   BMI 41.47 kg/m   Physical Exam Vitals and nursing note reviewed.  Constitutional:      General: She is not in acute distress.    Appearance: She is obese.  HENT:     Head: Normocephalic and atraumatic.     Right Ear: Tympanic membrane, ear canal and external ear normal.     Left Ear: Tympanic membrane, ear canal and external ear normal.     Nose: Nose normal.     Mouth/Throat:     Mouth: Mucous membranes are moist.     Pharynx: Oropharynx is clear.  Eyes:     Conjunctiva/sclera: Conjunctivae normal.     Pupils: Pupils are equal, round, and reactive to light.  Neck:     Thyroid: No thyromegaly.  Cardiovascular:     Rate and Rhythm: Normal rate and regular rhythm.     Heart sounds: Normal heart sounds. No murmur heard. Pulmonary:     Effort: Pulmonary effort is normal. No respiratory distress.     Breath sounds: Normal breath sounds.  Abdominal:     General: There is no distension.     Palpations: Abdomen is soft. There is no mass.      Tenderness: There is no abdominal tenderness.  Musculoskeletal:        General: Normal range of motion.     Cervical back: Normal range of motion and neck supple.  Skin:    General: Skin is  warm and dry.  Neurological:     General: No focal deficit present.     Mental Status: She is alert and oriented to person, place, and time.  Psychiatric:        Mood and Affect: Mood normal.        Behavior: Behavior normal.         Assessment & Plan:  1. Annual physical exam  - CMP14+EGFR  2. Screening for deficiency anemia  - CBC with Differential  3. Screening for lipid disorders  - Lipid Panel  4. Screening for endocrine/metabolic/immunity disorders  - Vitamin D, 25-hydroxy  5. Screening for HIV (human immunodeficiency virus)  - HIV antibody (with reflex)  6. Need for hepatitis C screening test  - Hepatitis C Antibody   Return in about 6 months (around 08/27/2022) for follow up.   Becky Sax, MD

## 2022-03-09 ENCOUNTER — Other Ambulatory Visit: Payer: BC Managed Care – PPO

## 2022-03-09 ENCOUNTER — Other Ambulatory Visit: Payer: Self-pay

## 2022-03-09 ENCOUNTER — Other Ambulatory Visit (HOSPITAL_BASED_OUTPATIENT_CLINIC_OR_DEPARTMENT_OTHER): Payer: Self-pay

## 2022-03-12 ENCOUNTER — Other Ambulatory Visit: Payer: BC Managed Care – PPO

## 2022-03-15 ENCOUNTER — Encounter (INDEPENDENT_AMBULATORY_CARE_PROVIDER_SITE_OTHER): Payer: Self-pay | Admitting: Family Medicine

## 2022-03-15 ENCOUNTER — Ambulatory Visit (INDEPENDENT_AMBULATORY_CARE_PROVIDER_SITE_OTHER): Payer: BC Managed Care – PPO | Admitting: Family Medicine

## 2022-03-15 ENCOUNTER — Other Ambulatory Visit (HOSPITAL_COMMUNITY): Payer: Self-pay

## 2022-03-15 ENCOUNTER — Other Ambulatory Visit (HOSPITAL_BASED_OUTPATIENT_CLINIC_OR_DEPARTMENT_OTHER): Payer: Self-pay

## 2022-03-15 VITALS — BP 124/68 | HR 53 | Temp 98.1°F | Ht 65.0 in | Wt 244.0 lb

## 2022-03-15 DIAGNOSIS — F3289 Other specified depressive episodes: Secondary | ICD-10-CM

## 2022-03-15 DIAGNOSIS — I10 Essential (primary) hypertension: Secondary | ICD-10-CM

## 2022-03-15 DIAGNOSIS — E669 Obesity, unspecified: Secondary | ICD-10-CM

## 2022-03-15 DIAGNOSIS — Z6841 Body Mass Index (BMI) 40.0 and over, adult: Secondary | ICD-10-CM

## 2022-03-15 DIAGNOSIS — F32A Depression, unspecified: Secondary | ICD-10-CM | POA: Insufficient documentation

## 2022-03-15 MED ORDER — BUPROPION HCL ER (SR) 150 MG PO TB12
150.0000 mg | ORAL_TABLET | Freq: Every day | ORAL | 0 refills | Status: DC
  Filled 2022-03-15: qty 30, 30d supply, fill #0

## 2022-03-15 MED ORDER — WEGOVY 2.4 MG/0.75ML ~~LOC~~ SOAJ
2.4000 mg | SUBCUTANEOUS | 0 refills | Status: DC
  Filled 2022-03-15 – 2022-03-21 (×2): qty 3, 28d supply, fill #0

## 2022-03-15 MED ORDER — DOXYCYCLINE MONOHYDRATE 100 MG PO TABS
100.0000 mg | ORAL_TABLET | Freq: Two times a day (BID) | ORAL | 0 refills | Status: DC
  Filled 2022-03-15 (×2): qty 20, 10d supply, fill #0

## 2022-03-16 ENCOUNTER — Encounter (INDEPENDENT_AMBULATORY_CARE_PROVIDER_SITE_OTHER): Payer: Self-pay | Admitting: Family Medicine

## 2022-03-16 ENCOUNTER — Other Ambulatory Visit (HOSPITAL_COMMUNITY): Payer: Self-pay

## 2022-03-16 ENCOUNTER — Encounter (HOSPITAL_BASED_OUTPATIENT_CLINIC_OR_DEPARTMENT_OTHER): Payer: Self-pay

## 2022-03-16 ENCOUNTER — Other Ambulatory Visit (HOSPITAL_BASED_OUTPATIENT_CLINIC_OR_DEPARTMENT_OTHER): Payer: Self-pay

## 2022-03-18 ENCOUNTER — Other Ambulatory Visit (HOSPITAL_BASED_OUTPATIENT_CLINIC_OR_DEPARTMENT_OTHER): Payer: Self-pay

## 2022-03-19 ENCOUNTER — Telehealth (INDEPENDENT_AMBULATORY_CARE_PROVIDER_SITE_OTHER): Payer: Self-pay | Admitting: Family Medicine

## 2022-03-19 ENCOUNTER — Other Ambulatory Visit (HOSPITAL_BASED_OUTPATIENT_CLINIC_OR_DEPARTMENT_OTHER): Payer: Self-pay

## 2022-03-19 NOTE — Telephone Encounter (Signed)
Pamela [name] (Key: [id number])  Your demographic data has been sent to [facility] successfully!  [employer] typically takes 5-10 minutes to respond, but it may take a little longer in some cases. You will be notified by email when available. You can also check for an update later by opening this request from your dashboard. Please do not fax or call [employer] to resubmit this request. If you need assistance, please chat with [employer] or call us at [phone].  If it has been longer than 24 hours, please reach out to [place].

## 2022-03-20 ENCOUNTER — Other Ambulatory Visit (HOSPITAL_BASED_OUTPATIENT_CLINIC_OR_DEPARTMENT_OTHER): Payer: Self-pay

## 2022-03-21 ENCOUNTER — Other Ambulatory Visit (HOSPITAL_BASED_OUTPATIENT_CLINIC_OR_DEPARTMENT_OTHER): Payer: Self-pay

## 2022-03-27 NOTE — Telephone Encounter (Signed)
Your prior authorization for Pamela [name] has been approved! MORE INFO Personalized support and financial assistance may be available through the [employer] program. For more information, and to see program requirements, click on the More Info button to the right.  Message from plan: Your PA request has been approved. Additional information will be provided in the approval communication. (Message 1145). Authorization Expiration Date: March 20, 2023.

## 2022-03-29 NOTE — Progress Notes (Signed)
Chief Complaint:   OBESITY Pamela [name] is here to discuss her progress with her obesity treatment plan along with follow-up of her obesity related diagnoses. Pamela [name] is on the Category 2 Plan or keeping a food journal and adhering to recommended goals of 1200-1300 calories and 85+ grams of protein and states she is following her eating plan approximately 40% of the time. Pamela [name] states she is doing 0 minutes 0 times per week.  Today's visit was #: 64 Starting weight: 333 lbs Starting date: 03/15/2020 Today's weight: 244 lbs Today's date: 03/15/2022 Total lbs lost to date: 89 Total lbs lost since last in-office visit: 0  Interim History: Pamela [name] is on Wegovy and she is tolerating it well, but notes it is not helping her hunger as much as it used to. She has a foot injury and she is unable to exercise.   Subjective:   1. Essential hypertension Clevie's blood pressure is well controlled on losartan. She has some increased stress recently, but is still working on weight loss to help control her blood pressure.   2. Emotional Eating Behaviors Pamela [name] is struggling with increased emotional eating behaviors. She has been on Wellbutrin in the past for ADD.   Assessment/Plan:   1. Essential hypertension Pamela [name] will get back to her diet, and she is to hold off on exercise until her foot heals.   2. Emotional Eating Behaviors Pamela [name] agreed to start Wellbutrin SR 150 mg q AM with no refills.   - buPROPion (WELLBUTRIN SR) 150 MG 12 hr tablet; Take 1 tablet (150 mg total) by mouth daily.  Dispense: 30 tablet; Refill: 0  3. BMI 40.0-44.9, adult (HCC)  4. Obesity, Beginning BMI 55.41 We will refill Wegovy for 1 month. Patient was educated on how increased simple carbohydrates can make Wegovy less effective.   - Semaglutide-Weight Management (WEGOVY) 2.4 MG/0.75ML SOAJ; Inject 2.4 mg into the skin once a week.  Dispense: 3 mL; Refill: 0  Pamela [name] is currently in the action stage of change. As such, her goal  is to continue with weight loss efforts. She has agreed to change to following a lower carbohydrate, vegetable and lean protein rich diet plan.   Exercise goals: Wait to exercise when her foot heals.   Behavioral modification strategies: increasing lean protein intake.  Pamela [name] has agreed to follow-up with our clinic in 4 weeks. She was informed of the importance of frequent follow-up visits to maximize her success with intensive lifestyle modifications for her multiple health conditions.   Objective:   Blood pressure 124/68, pulse (!) 53, temperature 98.1 F (36.7 C), height 5' 5"$  (1.651 m), weight 244 lb (110.7 kg), last menstrual period 03/16/2021, SpO2 98 %. Body mass index is 40.6 kg/m.  General: Cooperative, alert, well developed, in no acute distress. HEENT: Conjunctivae and lids unremarkable. Cardiovascular: Regular rhythm.  Lungs: Normal work of breathing. Neurologic: No focal deficits.   Lab Results  Component Value Date   CREATININE 0.84 11/23/2021   BUN 17 11/23/2021   NA 138 11/23/2021   K 4.3 11/23/2021   CL 100 11/23/2021   CO2 19 (L) 11/23/2021   Lab Results  Component Value Date   ALT 14 11/23/2021   AST 14 11/23/2021   ALKPHOS 64 11/23/2021   BILITOT 0.4 11/23/2021   Lab Results  Component Value Date   HGBA1C 5.6 11/23/2021   HGBA1C 5.5 05/29/2021   HGBA1C 6.4 (H) 07/28/2020   HGBA1C 6.4 (H) 03/15/2020   Lab Results  Component  Value Date   INSULIN 11.0 11/23/2021   INSULIN 13.1 05/29/2021   INSULIN 15.0 07/28/2020   INSULIN 30.0 (H) 03/15/2020   Lab Results  Component Value Date   TSH 2.170 03/15/2020   Lab Results  Component Value Date   CHOL 194 11/23/2021   HDL 59 11/23/2021   LDLCALC 124 (H) 11/23/2021   TRIG 62 11/23/2021   CHOLHDL 3.3 11/23/2021   Lab Results  Component Value Date   VD25OH 95.2 11/23/2021   VD25OH 78.1 05/29/2021   VD25OH 68.5 07/28/2020   Lab Results  Component Value Date   WBC 4.7 04/10/2021   HGB 14.1  04/10/2021   HCT 40.8 04/10/2021   MCV 88.3 04/10/2021   PLT 270 04/10/2021   Lab Results  Component Value Date   IRON 63 07/28/2020   TIBC 274 07/28/2020   FERRITIN 57 07/28/2020   Attestation Statements:   Reviewed by clinician on day of visit: allergies, medications, problem list, medical history, surgical history, family history, social history, and previous encounter notes.   I, Trixie Dredge, am acting as transcriptionist for Dennard Nip, MD.  I have reviewed the above documentation for accuracy and completeness, and I agree with the above. -  Dennard Nip, MD

## 2022-04-04 ENCOUNTER — Encounter: Payer: Self-pay | Admitting: Family Medicine

## 2022-04-12 ENCOUNTER — Encounter (INDEPENDENT_AMBULATORY_CARE_PROVIDER_SITE_OTHER): Payer: Self-pay | Admitting: Family Medicine

## 2022-04-12 ENCOUNTER — Other Ambulatory Visit (HOSPITAL_BASED_OUTPATIENT_CLINIC_OR_DEPARTMENT_OTHER): Payer: Self-pay

## 2022-04-12 ENCOUNTER — Ambulatory Visit (INDEPENDENT_AMBULATORY_CARE_PROVIDER_SITE_OTHER): Payer: BC Managed Care – PPO | Admitting: Family Medicine

## 2022-04-12 VITALS — BP 148/78 | HR 70 | Temp 98.1°F | Ht 65.0 in | Wt 241.0 lb

## 2022-04-12 DIAGNOSIS — F3289 Other specified depressive episodes: Secondary | ICD-10-CM | POA: Diagnosis not present

## 2022-04-12 DIAGNOSIS — E669 Obesity, unspecified: Secondary | ICD-10-CM | POA: Diagnosis not present

## 2022-04-12 DIAGNOSIS — I10 Essential (primary) hypertension: Secondary | ICD-10-CM | POA: Diagnosis not present

## 2022-04-12 DIAGNOSIS — Z6841 Body Mass Index (BMI) 40.0 and over, adult: Secondary | ICD-10-CM

## 2022-04-12 MED ORDER — WEGOVY 2.4 MG/0.75ML ~~LOC~~ SOAJ
2.4000 mg | SUBCUTANEOUS | 0 refills | Status: DC
  Filled 2022-04-12 – 2022-04-24 (×2): qty 3, 28d supply, fill #0
  Filled 2022-06-26: qty 3, 28d supply, fill #1

## 2022-04-12 MED ORDER — BUPROPION HCL ER (SR) 150 MG PO TB12
150.0000 mg | ORAL_TABLET | Freq: Every day | ORAL | 0 refills | Status: DC
  Filled 2022-04-12 – 2022-04-23 (×2): qty 30, 30d supply, fill #0

## 2022-04-17 ENCOUNTER — Other Ambulatory Visit: Payer: Self-pay | Admitting: Family Medicine

## 2022-04-17 ENCOUNTER — Other Ambulatory Visit (HOSPITAL_BASED_OUTPATIENT_CLINIC_OR_DEPARTMENT_OTHER): Payer: Self-pay

## 2022-04-17 MED ORDER — PANTOPRAZOLE SODIUM 40 MG PO TBEC
40.0000 mg | DELAYED_RELEASE_TABLET | Freq: Every day | ORAL | 0 refills | Status: DC
  Filled 2022-04-17 – 2022-04-24 (×2): qty 30, 30d supply, fill #0

## 2022-04-19 ENCOUNTER — Other Ambulatory Visit (HOSPITAL_COMMUNITY): Payer: Self-pay | Admitting: Gastroenterology

## 2022-04-19 DIAGNOSIS — R1011 Right upper quadrant pain: Secondary | ICD-10-CM

## 2022-04-19 DIAGNOSIS — R110 Nausea: Secondary | ICD-10-CM

## 2022-04-23 ENCOUNTER — Other Ambulatory Visit (HOSPITAL_BASED_OUTPATIENT_CLINIC_OR_DEPARTMENT_OTHER): Payer: Self-pay

## 2022-04-24 ENCOUNTER — Other Ambulatory Visit (HOSPITAL_BASED_OUTPATIENT_CLINIC_OR_DEPARTMENT_OTHER): Payer: Self-pay

## 2022-04-24 ENCOUNTER — Other Ambulatory Visit: Payer: Self-pay

## 2022-04-24 NOTE — Progress Notes (Signed)
Chief Complaint:   OBESITY Pamela [name] is here to discuss her progress with her obesity treatment plan along with follow-up of her obesity related diagnoses. Pamela [name] is on following a lower carbohydrate, vegetable and lean protein rich diet plan and states she is following her eating plan approximately 98% of the time. Pamela [name] states she is doing 0 minutes 0 times per week.  Today's visit was #: 93 Starting weight: 333 lbs Starting date: 03/15/2020 Today's weight: 241 lbs Today's date: 04/12/2022 Total lbs lost to date: 92 Total lbs lost since last in-office visit: 3  Interim History: Pamela [name] continues to do well with her weight loss. Her hunger is better controlled and she feels she is doing better with her diet. She is getting ready to increase walking for exercise.   Subjective:   1. Essential hypertension Aditi's blood pressure is elevated. She notes some increase in pain. Normally her blood pressure is very well controlled.   2. Emotional Eating Behaviors Pamela [name] feels she is doing better on Wellbutrin. She notes decreased emotional eating behaviors. No side effects were noted and she is sleeping well.   Assessment/Plan:   1. Essential hypertension Pamela [name] will continue Hyzaar, and we will recheck her blood pressure in 1 month.   2. Emotional Eating Behaviors Pamela [name] will continue Wellbutrin SR, and we will refill for 1 month.   - buPROPion (WELLBUTRIN SR) 150 MG 12 hr tablet; Take 1 tablet (150 mg total) by mouth daily.  Dispense: 30 tablet; Refill: 0  3. BMI 40.0-44.9, adult (HCC)  4. Obesity, Beginning BMI 55.41 We will refill Wegovy 2.4 mg for 90 days.   - Semaglutide-Weight Management (WEGOVY) 2.4 MG/0.75ML SOAJ; Inject 2.4 mg into the skin once a week.  Dispense: 9 mL; Refill: 0  Pamela [name] is currently in the action stage of change. As such, her goal is to continue with weight loss efforts. She has agreed to practicing portion control and making smarter food choices, such as  increasing vegetables and decreasing simple carbohydrates.   Behavioral modification strategies: increasing lean protein intake and meal planning and cooking strategies.  Pamela [name] has agreed to follow-up with our clinic in 4 weeks. She was informed of the importance of frequent follow-up visits to maximize her success with intensive lifestyle modifications for her multiple health conditions.   Objective:   Blood pressure (!) 148/78, pulse 70, temperature 98.1 F (36.7 C), height 5\' 5"  (1.651 m), weight 241 lb (109.3 kg), last menstrual period 03/16/2021, SpO2 98 %. Body mass index is 40.1 kg/m.  Lab Results  Component Value Date   CREATININE 0.84 11/23/2021   BUN 17 11/23/2021   NA 138 11/23/2021   K 4.3 11/23/2021   CL 100 11/23/2021   CO2 19 (L) 11/23/2021   Lab Results  Component Value Date   ALT 14 11/23/2021   AST 14 11/23/2021   ALKPHOS 64 11/23/2021   BILITOT 0.4 11/23/2021   Lab Results  Component Value Date   HGBA1C 5.6 11/23/2021   HGBA1C 5.5 05/29/2021   HGBA1C 6.4 (H) 07/28/2020   HGBA1C 6.4 (H) 03/15/2020   Lab Results  Component Value Date   INSULIN 11.0 11/23/2021   INSULIN 13.1 05/29/2021   INSULIN 15.0 07/28/2020   INSULIN 30.0 (H) 03/15/2020   Lab Results  Component Value Date   TSH 2.170 03/15/2020   Lab Results  Component Value Date   CHOL 194 11/23/2021   HDL 59 11/23/2021   LDLCALC 124 (H) 11/23/2021   TRIG  62 11/23/2021   CHOLHDL 3.3 11/23/2021   Lab Results  Component Value Date   VD25OH 95.2 11/23/2021   VD25OH 78.1 05/29/2021   VD25OH 68.5 07/28/2020   Lab Results  Component Value Date   WBC 4.7 04/10/2021   HGB 14.1 04/10/2021   HCT 40.8 04/10/2021   MCV 88.3 04/10/2021   PLT 270 04/10/2021   Lab Results  Component Value Date   IRON 63 07/28/2020   TIBC 274 07/28/2020   FERRITIN 57 07/28/2020   Attestation Statements:   Reviewed by clinician on day of visit: allergies, medications, problem list, medical history,  surgical history, family history, social history, and previous encounter notes.   I, Trixie Dredge, am acting as transcriptionist for Dennard Nip, MD.  I have reviewed the above documentation for accuracy and completeness, and I agree with the above. -  Dennard Nip, MD

## 2022-05-03 ENCOUNTER — Ambulatory Visit (HOSPITAL_COMMUNITY)
Admission: RE | Admit: 2022-05-03 | Discharge: 2022-05-03 | Disposition: A | Discharge status: Home / Self Care | Payer: BC Managed Care – PPO | Source: Ambulatory Visit | Attending: Gastroenterology | Admitting: Gastroenterology

## 2022-05-03 DIAGNOSIS — R1011 Right upper quadrant pain: Secondary | ICD-10-CM

## 2022-05-03 DIAGNOSIS — R110 Nausea: Secondary | ICD-10-CM | POA: Diagnosis present

## 2022-05-08 ENCOUNTER — Ambulatory Visit (HOSPITAL_COMMUNITY)
Admission: RE | Admit: 2022-05-08 | Discharge: 2022-05-08 | Disposition: A | Discharge status: Home / Self Care | Payer: BC Managed Care – PPO | Source: Ambulatory Visit | Attending: Gastroenterology | Admitting: Gastroenterology

## 2022-05-08 DIAGNOSIS — R110 Nausea: Secondary | ICD-10-CM | POA: Insufficient documentation

## 2022-05-08 DIAGNOSIS — R1011 Right upper quadrant pain: Secondary | ICD-10-CM | POA: Diagnosis present

## 2022-05-08 MED ORDER — TECHNETIUM TC 99M MEBROFENIN IV KIT
4.9000 | PACK | Freq: Once | INTRAVENOUS | Status: AC | PRN
  Administered 2022-05-08: 4.9 via INTRAVENOUS

## 2022-05-09 ENCOUNTER — Encounter (INDEPENDENT_AMBULATORY_CARE_PROVIDER_SITE_OTHER): Payer: Self-pay | Admitting: Family Medicine

## 2022-05-09 ENCOUNTER — Ambulatory Visit (INDEPENDENT_AMBULATORY_CARE_PROVIDER_SITE_OTHER): Payer: BC Managed Care – PPO | Admitting: Family Medicine

## 2022-05-09 VITALS — BP 129/81 | HR 71 | Temp 97.7°F | Ht 65.0 in | Wt 243.0 lb

## 2022-05-09 DIAGNOSIS — Z6841 Body Mass Index (BMI) 40.0 and over, adult: Secondary | ICD-10-CM | POA: Diagnosis not present

## 2022-05-09 DIAGNOSIS — R7303 Prediabetes: Secondary | ICD-10-CM | POA: Diagnosis not present

## 2022-05-09 DIAGNOSIS — E669 Obesity, unspecified: Secondary | ICD-10-CM | POA: Diagnosis not present

## 2022-05-14 NOTE — Progress Notes (Unsigned)
Chief Complaint:   OBESITY Pamela [name] is here to discuss her progress with her obesity treatment plan along with follow-up of her obesity related diagnoses. Pamela [name] is on practicing portion control and making smarter food choices, such as increasing vegetables and decreasing simple carbohydrates and states she is following her eating plan approximately 50% of the time. Pamela [name] states she is doing 0 minutes 0 times per week.  Today's visit was #: 32 Starting weight: 333 lbs Starting date: 03/15/2020 Today's weight: 243 lbs Today's date: 05/09/2022 Total lbs lost to date: 90 Total lbs lost since last in-office visit: 0  Interim History: Pamela [name] has been traveling and had more celebration eating in the last month. She has tired to journal on and off, and she is staying mindful about portion. She is working on getting back on track.   Subjective:   1. Pre-diabetes Pamela [name] is on a GLP-1, and she is working on her diet and exercise. She will no longer have insurance for [facility] and she has 3 weeks left.   Assessment/Plan:   1. Pre-diabetes Pamela [name] is to restart metformin after Reginal Lutes is finished. No refill needed today. She will continue to maximize lean protein to help decrease polyphagia.   2. BMI 40.0-44.9, adult  3. Obesity, Beginning BMI 55.41 Pamela [name] is currently in the action stage of change. As such, her goal is to continue with weight loss efforts. She has agreed to keeping a food journal and adhering to recommended goals of 1400 calories and 80+ grams of protein daily.   Behavioral modification strategies: increasing lean protein intake, decreasing simple carbohydrates, and keeping a strict food journal.  Pamela [name] has agreed to follow-up with our clinic in 4 weeks. She was informed of the importance of frequent follow-up visits to maximize her success with intensive lifestyle modifications for her multiple health conditions.   Objective:   Blood pressure 129/81, pulse 71, temperature 97.7 F  (36.5 C), height 5\' 5"  (1.651 m), weight 243 lb (110.2 kg), last menstrual period 03/16/2021, SpO2 97 %. Body mass index is 40.44 kg/m.  Lab Results  Component Value Date   CREATININE 0.84 11/23/2021   BUN 17 11/23/2021   NA 138 11/23/2021   K 4.3 11/23/2021   CL 100 11/23/2021   CO2 19 (L) 11/23/2021   Lab Results  Component Value Date   ALT 14 11/23/2021   AST 14 11/23/2021   ALKPHOS 64 11/23/2021   BILITOT 0.4 11/23/2021   Lab Results  Component Value Date   HGBA1C 5.6 11/23/2021   HGBA1C 5.5 05/29/2021   HGBA1C 6.4 (H) 07/28/2020   HGBA1C 6.4 (H) 03/15/2020   Lab Results  Component Value Date   INSULIN 11.0 11/23/2021   INSULIN 13.1 05/29/2021   INSULIN 15.0 07/28/2020   INSULIN 30.0 (H) 03/15/2020   Lab Results  Component Value Date   TSH 2.170 03/15/2020   Lab Results  Component Value Date   CHOL 194 11/23/2021   HDL 59 11/23/2021   LDLCALC 124 (H) 11/23/2021   TRIG 62 11/23/2021   CHOLHDL 3.3 11/23/2021   Lab Results  Component Value Date   VD25OH 95.2 11/23/2021   VD25OH 78.1 05/29/2021   VD25OH 68.5 07/28/2020   Lab Results  Component Value Date   WBC 4.7 04/10/2021   HGB 14.1 04/10/2021   HCT 40.8 04/10/2021   MCV 88.3 04/10/2021   PLT 270 04/10/2021   Lab Results  Component Value Date   IRON 63 07/28/2020   TIBC  274 07/28/2020   FERRITIN 57 07/28/2020   Attestation Statements:   Reviewed by clinician on day of visit: allergies, medications, problem list, medical history, surgical history, family history, social history, and previous encounter notes.  Time spent on visit including pre-visit chart review and post-visit care and charting was 30 minutes.   I, Burt Knack, am acting as transcriptionist for Quillian Quince, MD.  I have reviewed the above documentation for accuracy and completeness, and I agree with the above. -  Quillian Quince, MD

## 2022-05-27 ENCOUNTER — Other Ambulatory Visit (HOSPITAL_BASED_OUTPATIENT_CLINIC_OR_DEPARTMENT_OTHER): Payer: Self-pay

## 2022-05-27 ENCOUNTER — Other Ambulatory Visit: Payer: Self-pay | Admitting: Family Medicine

## 2022-05-27 ENCOUNTER — Other Ambulatory Visit (INDEPENDENT_AMBULATORY_CARE_PROVIDER_SITE_OTHER): Payer: Self-pay | Admitting: Family Medicine

## 2022-05-27 DIAGNOSIS — F3289 Other specified depressive episodes: Secondary | ICD-10-CM

## 2022-05-28 ENCOUNTER — Ambulatory Visit: Payer: BC Managed Care – PPO | Admitting: Family Medicine

## 2022-05-28 ENCOUNTER — Other Ambulatory Visit (HOSPITAL_BASED_OUTPATIENT_CLINIC_OR_DEPARTMENT_OTHER): Payer: Self-pay

## 2022-05-28 MED ORDER — PANTOPRAZOLE SODIUM 40 MG PO TBEC
40.0000 mg | DELAYED_RELEASE_TABLET | Freq: Every day | ORAL | 0 refills | Status: DC
  Filled 2022-05-28: qty 90, 90d supply, fill #0

## 2022-05-28 NOTE — Telephone Encounter (Signed)
Requested Prescriptions  Pending Prescriptions Disp Refills   pantoprazole (PROTONIX) 40 MG tablet 90 tablet 0    Sig: Take 1 tablet (40 mg total) by mouth daily.     Gastroenterology: Proton Pump Inhibitors Passed - 05/27/2022 10:19 AM      Passed - Valid encounter within last 12 months    Recent Outpatient Visits           3 months ago Annual physical exam   [facility], MD   4 months ago Mild persistent asthma with acute exacerbation   [facility], MD   4 months ago Bilateral otitis media, unspecified otitis media type   [facility] [place], NP   6 months ago Essential hypertension   [facility], MD       Future Appointments             In 4 weeks Georganna Skeans, MD [facility]

## 2022-05-29 ENCOUNTER — Other Ambulatory Visit (HOSPITAL_COMMUNITY): Payer: Self-pay

## 2022-05-29 MED ORDER — MELOXICAM 15 MG PO TABS
15.0000 mg | ORAL_TABLET | Freq: Every day | ORAL | 2 refills | Status: DC
  Filled 2022-05-30: qty 30, 30d supply, fill #0
  Filled 2022-06-26: qty 30, 30d supply, fill #1
  Filled 2022-08-19: qty 30, 30d supply, fill #2

## 2022-05-30 ENCOUNTER — Other Ambulatory Visit (HOSPITAL_BASED_OUTPATIENT_CLINIC_OR_DEPARTMENT_OTHER): Payer: Self-pay

## 2022-05-30 ENCOUNTER — Encounter (HOSPITAL_BASED_OUTPATIENT_CLINIC_OR_DEPARTMENT_OTHER): Payer: Self-pay | Admitting: Pharmacist

## 2022-06-07 ENCOUNTER — Ambulatory Visit (INDEPENDENT_AMBULATORY_CARE_PROVIDER_SITE_OTHER): Payer: BC Managed Care – PPO | Admitting: Family Medicine

## 2022-06-11 ENCOUNTER — Encounter (INDEPENDENT_AMBULATORY_CARE_PROVIDER_SITE_OTHER): Payer: Self-pay | Admitting: Physician Assistant

## 2022-06-11 ENCOUNTER — Other Ambulatory Visit (HOSPITAL_BASED_OUTPATIENT_CLINIC_OR_DEPARTMENT_OTHER): Payer: Self-pay

## 2022-06-11 ENCOUNTER — Ambulatory Visit (INDEPENDENT_AMBULATORY_CARE_PROVIDER_SITE_OTHER): Payer: BC Managed Care – PPO | Admitting: Physician Assistant

## 2022-06-11 VITALS — BP 138/89 | HR 51 | Temp 98.9°F | Ht 65.0 in | Wt <= 1120 oz

## 2022-06-11 DIAGNOSIS — I10 Essential (primary) hypertension: Secondary | ICD-10-CM | POA: Diagnosis not present

## 2022-06-11 DIAGNOSIS — E7849 Other hyperlipidemia: Secondary | ICD-10-CM | POA: Diagnosis not present

## 2022-06-11 DIAGNOSIS — F3289 Other specified depressive episodes: Secondary | ICD-10-CM | POA: Diagnosis not present

## 2022-06-11 DIAGNOSIS — R7303 Prediabetes: Secondary | ICD-10-CM | POA: Diagnosis not present

## 2022-06-11 DIAGNOSIS — Z6841 Body Mass Index (BMI) 40.0 and over, adult: Secondary | ICD-10-CM

## 2022-06-11 MED ORDER — METFORMIN HCL 500 MG PO TABS
500.0000 mg | ORAL_TABLET | Freq: Every day | ORAL | 0 refills | Status: DC
  Filled 2022-06-11: qty 30, 30d supply, fill #0

## 2022-06-11 MED ORDER — BUPROPION HCL ER (SR) 150 MG PO TB12
150.0000 mg | ORAL_TABLET | Freq: Every day | ORAL | 0 refills | Status: DC
  Filled 2022-06-11: qty 30, 30d supply, fill #0

## 2022-06-11 NOTE — Progress Notes (Signed)
Office: [phone]  /  Fax: [phone]  WEIGHT SUMMARY AND BIOMETRICS  Vitals Temp: 98.9 F (37.2 C) BP: 138/89 Pulse Rate: (!) 51 SpO2: 100 %   Anthropometric Measurements Height: 5\' 5"  (1.651 m) Weight: 13 lb (5.897 kg) BMI (Calculated): 2.16 Weight at Last Visit: 243 lb Weight Lost Since Last Visit: 0 lb Weight Gained Since Last Visit: 13 lb Starting Weight: 333 lb   Body Composition  Body Fat %: 53.4 % Fat Mass (lbs): 136.8 lbs Muscle Mass (lbs): 113.4 lbs Visceral Fat Rating : 17   Other Clinical Data Fasting: No Labs: No Today's Visit #: 104 Starting Date: 03/15/20     HPI  Chief Complaint: OBESITY  Pamela [name] is here to discuss her progress with her obesity treatment plan. She is on the keeping a food journal and adhering to recommended goals of 1380 calories and 98 protein and states she is following her eating plan approximately 10 % of the time. She states she is exercising 0 minutes 0 times per week.   Interval History:  Since last office visit she is up 13 lbs.    She has stopped GLP-1 medications due to no insurance coverage.  She is working to get back on track with her nutrition plan.  Hunger/appetite- not well controlled off GLP-1 medications.  Cravings- Reports more mindless eating, not really cravings per se Stress- End of school year, so some increased stress. 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  Exercise- Plans to start walking a 5 mile route during the summer at lunch Hydration- Needs to increase water intake.   Bored with recipes and we discussed looking at [email or web address] for more recipe ideas.   Plans to come in for fasting labs prior to next office visit.  She was informed we would discuss her lab results at her next visit unless there is a critical issue that needs to be addressed sooner. She agreed to keep her next visit at the agreed upon time to discuss these results.     Pharmacotherapy: Off GLP-1 Wegovy now due to no insurance coverage.   PHYSICAL EXAM:  Blood pressure 138/89, pulse (!) 51, temperature 98.9 F (37.2 C), height 5\' 5"  (1.651 m), weight 13 lb (5.897 kg), last menstrual period 03/16/2021, SpO2 100 %. Body mass index is 2.16 kg/m.  General: She is overweight, cooperative, alert, well developed, and in no acute distress. PSYCH: Has normal mood, affect and thought process.   Cardiovascular: HR 50's regular. BP up a little today.  Lungs: Normal breathing effort, no conversational dyspnea. Neuro: no focal deficits.   DIAGNOSTIC DATA REVIEWED:  BMET    Component Value Date/Time   NA 138 11/23/2021 1236   K 4.3 11/23/2021 1236   CL 100 11/23/2021 1236   CO2 19 (L) 11/23/2021 1236   GLUCOSE 85 11/23/2021 1236   GLUCOSE 82 04/10/2021 1127   BUN 17 11/23/2021 1236   CREATININE 0.84 11/23/2021 1236   CALCIUM 9.4 11/23/2021 1236   GFRNONAA >60 04/10/2021 1127   GFRAA 80 03/15/2020 1033   Lab Results  Component Value Date   HGBA1C 5.6 11/23/2021   HGBA1C 6.4 (H) 03/15/2020   Lab Results  Component Value Date   INSULIN 11.0 11/23/2021   INSULIN 30.0 (H) 03/15/2020   Lab Results  Component Value Date   TSH 2.170 03/15/2020   CBC    Component Value Date/Time   WBC 4.7 04/10/2021 1127   RBC 4.62  04/10/2021 1127   HGB 14.1 04/10/2021 1127   HGB 12.2 03/15/2020 1033   HCT 40.8 04/10/2021 1127   HCT 39.6 07/28/2020 0920   PLT 270 04/10/2021 1127   PLT 313 03/15/2020 1033   MCV 88.3 04/10/2021 1127   MCV 85 03/15/2020 1033   MCH 30.5 04/10/2021 1127   MCHC 34.6 04/10/2021 1127   RDW 13.8 04/10/2021 1127   RDW 15.3 03/15/2020 1033   Iron Studies    Component Value Date/Time   IRON 63 07/28/2020 0920   TIBC 274 07/28/2020 0920   FERRITIN 57 07/28/2020 0920   IRONPCTSAT 23 07/28/2020 0920   Lipid Panel     Component Value Date/Time   CHOL 194 11/23/2021 1236   TRIG 62 11/23/2021 1236   HDL 59 11/23/2021 1236    CHOLHDL 3.3 11/23/2021 1236   LDLCALC 124 (H) 11/23/2021 1236   Hepatic Function Panel     Component Value Date/Time   PROT 6.8 11/23/2021 1236   ALBUMIN 4.0 11/23/2021 1236   AST 14 11/23/2021 1236   ALT 14 11/23/2021 1236   ALKPHOS 64 11/23/2021 1236   BILITOT 0.4 11/23/2021 1236      Component Value Date/Time   TSH 2.170 03/15/2020 1033   Nutritional Lab Results  Component Value Date   VD25OH 95.2 11/23/2021   VD25OH 78.1 05/29/2021   VD25OH 68.5 07/28/2020    ASSOCIATED CONDITIONS ADDRESSED TODAY  ASSESSMENT AND PLAN Prediabetes Last A1c was 5.6/ Insulin 11-Off GLP -1 medications now.   Medication(s): None Lab Results  Component Value Date   HGBA1C 5.6 11/23/2021   HGBA1C 5.5 05/29/2021   HGBA1C 6.4 (H) 07/28/2020   HGBA1C 6.4 (H) 03/15/2020   Lab Results  Component Value Date   INSULIN 11.0 11/23/2021   INSULIN 13.1 05/29/2021   INSULIN 15.0 07/28/2020   INSULIN 30.0 (H) 03/15/2020    Plan: Start Metformin 500 mg once daily breakfast Continue working on nutrition plan to decrease simple carbohydrates, increase lean proteins and exercise to promote weight loss, improve glycemic control and prevent progression to Type 2 diabetes.  Plan to recheck fasting labs prior to next visit.   Hypertension Hypertension asymptomatic, no significant medication side effects noted, control uncertain, needs further observation, and needs improvement.  Medication(s): losartan-HCTZ 100-25 MG  Stable/normal renal function.  BP Readings from Last 3 Encounters:  06/11/22 138/89  05/09/22 129/81  04/12/22 (!) 148/78   Lab Results  Component Value Date   CREATININE 0.84 11/23/2021   CREATININE 0.94 05/29/2021   CREATININE 0.91 04/10/2021   No results found for: "GFR"  Plan: Continue all antihypertensives at current dosages. Continue to work on nutrition plan to promote weight loss and improve BP control.  Plan to recheck labs prior to next visit.    Hyperlipidemia LDL is not at goal. Medication(s): None Cardiovascular risk factors: dyslipidemia, hypertension, obesity (BMI >= 30 kg/m2), and sedentary lifestyle  Lab Results  Component Value Date   CHOL 194 11/23/2021   HDL 59 11/23/2021   LDLCALC 124 (H) 11/23/2021   TRIG 62 11/23/2021   CHOLHDL 3.3 11/23/2021   CHOLHDL 3.5 07/28/2020   CHOLHDL 3.2 03/15/2020   Lab Results  Component Value Date   ALT 14 11/23/2021   AST 14 11/23/2021   ALKPHOS 64 11/23/2021   BILITOT 0.4 11/23/2021   The 10-year ASCVD risk score (Arnett DK, et al., 2019) is: 4.7%   Values used to calculate the score:     Age: 77 years  Sex: Female     Is Non-Hispanic African American: Yes     Diabetic: No     Tobacco smoker: No     Systolic Blood Pressure: 138 mmHg     Is BP treated: Yes     HDL Cholesterol: 59 mg/dL     Total Cholesterol: 194 mg/dL  Plan: Continue working on nutrition plan to promote weight loss and improve lipid profile/decrease CV risk.  Recheck fasting lipids prior to next visit.   Other depression/emotional eating Pamela [name] has had issues with stress/emotional eating. Currently this is poorly controlled. Overall mood is stable. Medication(s): Bupropion SR 150 mg daily in am She has been off bupropion for the past few weeks and noted more " mindless eating" . Feels bupropion was helpful. No side effects with bupropion.   Plan: Continue and refill Bupropion SR 150 mg daily in am Continue to work on emotional eating/ self care strategies.     Problem List Items Addressed This Visit     Morbid obesity (HCC)   Relevant Medications   metFORMIN (GLUCOPHAGE) 500 MG tablet   Prediabetes - Primary   Relevant Medications   metFORMIN (GLUCOPHAGE) 500 MG tablet   Other Relevant Orders   CBC with Differential/Platelet   CMP14+EGFR   Hemoglobin A1c   Insulin, random   TSH   VITAMIN D 25 Hydroxy (Vit-D Deficiency, Fractures)   Essential hypertension   BMI 40.0-44.9, adult  (HCC)   Relevant Medications   metFORMIN (GLUCOPHAGE) 500 MG tablet   Hyperlipidemia, pure   Relevant Orders   Lipid Panel With LDL/HDL Ratio   Depression (Chronic)   Relevant Medications   buPROPion (WELLBUTRIN SR) 150 MG 12 hr tablet   Current BMI 42.6 Down 67 lbs total !! TBW loss 20.12 % !!!  TREATMENT PLAN FOR OBESITY:  Recommended Dietary Goals  Pamela [name] is currently in the action stage of change. As such, her goal is to continue weight management plan. She has agreed to keeping a food journal and adhering to recommended goals of 1400 calories and 85+ grams of protein.  Behavioral Intervention  We discussed the following Behavioral Modification Strategies today: increasing lean protein intake, decreasing simple carbohydrates , increasing vegetables, increasing fiber rich foods, increasing water intake, work on meal planning and preparation, emotional eating strategies and understanding the difference between hunger signals and cravings, work on managing stress, creating time for self-care and relaxation measures, continue to practice mindfulness when eating, and planning for success.  Additional resources provided today: NA  Recommended Physical Activity Goals  Pamela [name] has been advised to work up to 150 minutes of moderate intensity aerobic activity a week and strengthening exercises 2-3 times per week for cardiovascular health, weight loss maintenance and preservation of muscle mass.   She has agreed to Start aerobic activity with a goal of 150 minutes a week at moderate intensity.    Pharmacotherapy We discussed various medication options to help Pamela [name] with her weight loss efforts and we both agreed to start metformin for prediabetes and resume bupropion for emotional eating behaviors and continue to work on nutritional and behavioral strategies to promote weight loss.    Return in about 3 weeks (around 07/02/2022).Marland Kitchen She was informed of the importance of frequent follow up  visits to maximize her success with intensive lifestyle modifications for her multiple health conditions.   ATTESTASTION STATEMENTS:  Reviewed by clinician on day of visit: allergies, medications, problem list, medical history, surgical history, family history, social history, and previous encounter notes.  I have personally spent 42 minutes total time today in preparation, patient care, nutritional counseling and documentation for this visit, including the following: review of clinical lab tests; review of medical tests/procedures/services.      Floreen Teegarden, PA-C

## 2022-06-26 ENCOUNTER — Ambulatory Visit: Payer: BC Managed Care – PPO | Admitting: Family Medicine

## 2022-06-27 ENCOUNTER — Other Ambulatory Visit (HOSPITAL_COMMUNITY): Payer: Self-pay

## 2022-06-27 ENCOUNTER — Encounter (HOSPITAL_COMMUNITY): Payer: Self-pay

## 2022-06-27 ENCOUNTER — Other Ambulatory Visit (INDEPENDENT_AMBULATORY_CARE_PROVIDER_SITE_OTHER): Payer: Self-pay | Admitting: Physician Assistant

## 2022-06-27 DIAGNOSIS — F3289 Other specified depressive episodes: Secondary | ICD-10-CM

## 2022-06-27 LAB — CBC WITH DIFFERENTIAL/PLATELET
Basophils Absolute: 0 10*3/uL (ref 0.0–0.2)
Basos: 1 %
EOS (ABSOLUTE): 0.2 10*3/uL (ref 0.0–0.4)
Eos: 4 %
Hematocrit: 38.6 % (ref 34.0–46.6)
Hemoglobin: 12.7 g/dL (ref 11.1–15.9)
Immature Grans (Abs): 0 10*3/uL (ref 0.0–0.1)
Immature Granulocytes: 0 %
Lymphocytes Absolute: 2 10*3/uL (ref 0.7–3.1)
Lymphs: 48 %
MCH: 30.8 pg (ref 26.6–33.0)
MCHC: 32.9 g/dL (ref 31.5–35.7)
MCV: 94 fL (ref 79–97)
Monocytes Absolute: 0.5 10*3/uL (ref 0.1–0.9)
Monocytes: 11 %
Neutrophils Absolute: 1.5 10*3/uL (ref 1.4–7.0)
Neutrophils: 36 %
Platelets: 202 10*3/uL (ref 150–450)
RBC: 4.12 x10E6/uL (ref 3.77–5.28)
RDW: 13 % (ref 11.7–15.4)
WBC: 4.2 10*3/uL (ref 3.4–10.8)

## 2022-06-27 LAB — CMP14+EGFR
ALT: 13 IU/L (ref 0–32)
AST: 14 IU/L (ref 0–40)
Albumin/Globulin Ratio: 1.6 (ref 1.2–2.2)
Albumin: 3.9 g/dL (ref 3.8–4.9)
Alkaline Phosphatase: 51 IU/L (ref 44–121)
BUN/Creatinine Ratio: 17 (ref 9–23)
BUN: 14 mg/dL (ref 6–24)
Bilirubin Total: 0.3 mg/dL (ref 0.0–1.2)
CO2: 23 mmol/L (ref 20–29)
Calcium: 9 mg/dL (ref 8.7–10.2)
Chloride: 103 mmol/L (ref 96–106)
Creatinine, Ser: 0.81 mg/dL (ref 0.57–1.00)
Globulin, Total: 2.4 g/dL (ref 1.5–4.5)
Glucose: 103 mg/dL — ABNORMAL HIGH (ref 70–99)
Potassium: 4.3 mmol/L (ref 3.5–5.2)
Sodium: 139 mmol/L (ref 134–144)
Total Protein: 6.3 g/dL (ref 6.0–8.5)
eGFR: 87 mL/min/{1.73_m2} (ref >59)

## 2022-06-27 LAB — LIPID PANEL WITH LDL/HDL RATIO
Cholesterol, Total: 184 mg/dL (ref 100–199)
HDL: 85 mg/dL (ref >39)
LDL Chol Calc (NIH): 86 mg/dL (ref 0–99)
LDL/HDL Ratio: 1 ratio (ref 0.0–3.2)
Triglycerides: 67 mg/dL (ref 0–149)
VLDL Cholesterol Cal: 13 mg/dL (ref 5–40)

## 2022-06-27 LAB — INSULIN, RANDOM: INSULIN: 4 u[IU]/mL (ref 2.6–24.9)

## 2022-06-27 LAB — HEMOGLOBIN A1C
Est. average glucose Bld gHb Est-mCnc: 105 mg/dL
Hgb A1c MFr Bld: 5.3 % (ref 4.8–5.6)

## 2022-06-27 LAB — TSH: TSH: 1.82 u[IU]/mL (ref 0.450–4.500)

## 2022-06-27 LAB — VITAMIN D 25 HYDROXY (VIT D DEFICIENCY, FRACTURES): Vit D, 25-Hydroxy: 50.1 ng/mL (ref 30.0–100.0)

## 2022-07-03 ENCOUNTER — Encounter (INDEPENDENT_AMBULATORY_CARE_PROVIDER_SITE_OTHER): Payer: Self-pay | Admitting: Physician Assistant

## 2022-07-03 ENCOUNTER — Other Ambulatory Visit (HOSPITAL_BASED_OUTPATIENT_CLINIC_OR_DEPARTMENT_OTHER): Payer: Self-pay

## 2022-07-03 ENCOUNTER — Ambulatory Visit (INDEPENDENT_AMBULATORY_CARE_PROVIDER_SITE_OTHER): Payer: BC Managed Care – PPO | Admitting: Physician Assistant

## 2022-07-03 VITALS — BP 143/82 | HR 58 | Temp 98.1°F | Ht 65.0 in | Wt 263.0 lb

## 2022-07-03 DIAGNOSIS — E7849 Other hyperlipidemia: Secondary | ICD-10-CM

## 2022-07-03 DIAGNOSIS — R7303 Prediabetes: Secondary | ICD-10-CM | POA: Diagnosis not present

## 2022-07-03 DIAGNOSIS — I10 Essential (primary) hypertension: Secondary | ICD-10-CM

## 2022-07-03 DIAGNOSIS — Z6841 Body Mass Index (BMI) 40.0 and over, adult: Secondary | ICD-10-CM

## 2022-07-03 DIAGNOSIS — F3289 Other specified depressive episodes: Secondary | ICD-10-CM | POA: Diagnosis not present

## 2022-07-03 MED ORDER — BUPROPION HCL ER (SR) 150 MG PO TB12
150.0000 mg | ORAL_TABLET | Freq: Every day | ORAL | 0 refills | Status: DC
  Filled 2022-07-03 – 2022-07-06 (×2): qty 30, 30d supply, fill #0

## 2022-07-03 MED ORDER — TOPIRAMATE 25 MG PO TABS
25.0000 mg | ORAL_TABLET | Freq: Every day | ORAL | 0 refills | Status: DC
  Filled 2022-07-03: qty 45, 30d supply, fill #0

## 2022-07-03 MED ORDER — METFORMIN HCL 500 MG PO TABS
500.0000 mg | ORAL_TABLET | Freq: Every day | ORAL | 0 refills | Status: DC
  Filled 2022-07-03: qty 30, 30d supply, fill #0

## 2022-07-03 NOTE — Progress Notes (Signed)
.smr  Office: [phone]  /  Fax: [phone]  WEIGHT SUMMARY AND BIOMETRICS  Vitals Temp: 98.1 F (36.7 C) BP: (!) 143/82 Pulse Rate: (!) 58 SpO2: 99 %   Anthropometric Measurements Height: 5\' 5"  (1.651 m) Weight: 263 lb (119.3 kg) BMI (Calculated): 43.77 Weight at Last Visit: 256 lb Weight Gained Since Last Visit: 7 lb Starting Weight: 333 lb   Body Composition  Body Fat %: 55.4 % Fat Mass (lbs): 146 lbs Muscle Mass (lbs): 111.6 lbs Visceral Fat Rating : 18   Other Clinical Data Today's Visit #: 66 Starting Date: 03/15/20     HPI  Chief Complaint: OBESITY  Pamela [name] is here to discuss her progress with her obesity treatment plan. She is on the keeping a food journal and adhering to recommended goals of 1380 calories and 89-95 grams of protein and states she is following her eating plan approximately 40 % of the time. She states she is exercising 0 minutes 0 times per week.   Interval History:  Since last office visit she is up 7 lbs. Struggled with meal planning and prepping and took time to sit down and come up with a plan for meals. She also found a new plan- Eat Clean and is planning to try this as well to help her stay on track. We also discussed using Category 2 nutrition plan as a back up for when she does not have time to plan and prep as well.   Hunger/appetite-increased since she has been off [employer] Cravings- increased for sweets Stress- level should improve as summer approaches. 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  Sleep- mostly restorative.  Exercise-nothing consistently right now. Discussed starting with walking 10-15 mins. 4-5 times weekly Hydration-Goal of 80 oz daily.   Goals: 1) Continue  meal planning and prepping efforts  2) Walk 10-15 minutes 4-5 times weekly  3) Increase water to goal fo 80 oz daily.    Pharmacotherapy: metformin for prediabetes. No GI side effects.     Wellbutrin for  cravings.  Discussed other options to help decrease cravings, topiramate or topamax. Patient denies history of glaucoma or kidney stones. She was informed of side effects and that topiramate is teratogenic. She is S/P Lap Hysterectomy.    TREATMENT PLAN FOR OBESITY:  Recommended Dietary Goals  Pamela [name] is currently in the action stage of change. As such, her goal is to continue weight management plan. She has agreed to the Category 2 Plan and keeping a food journal and adhering to recommended goals of 1380 calories and 90+ grams of  protein.  Behavioral Intervention  We discussed the following Behavioral Modification Strategies today: increasing lean protein intake, decreasing simple carbohydrates , increasing vegetables, increasing lower glycemic fruits, increasing water intake, work on meal planning and preparation, continue to practice mindfulness when eating, and planning for success.  Additional resources provided today: Cat. 2 nutrition plan and grocery list.   Recommended Physical Activity Goals  Pamela [name] has been advised to work up to 150 minutes of moderate intensity aerobic activity a week and strengthening exercises 2-3 times per week for cardiovascular health, weight loss maintenance and preservation of muscle mass.   She has agreed to Increase physical activity in their day and reduce sedentary time (increase NEAT). and start walking 10-15 minutes 4-5 times weekly   Pharmacotherapy We discussed various medication options to help Pamela [name] with her weight loss efforts and we both agreed to continue metformin for prediabetes and  Wellbutrin for cravings and start topamax for cravings/emotional eating behaviors. .    Return in about 3 weeks (around 07/24/2022).Marland Kitchen She was informed of the importance of frequent follow up visits to maximize her success with intensive lifestyle modifications for her multiple health conditions.  PHYSICAL EXAM:  Blood pressure (!) 143/82, pulse (!) 58,  temperature 98.1 F (36.7 C), height 5\' 5"  (1.651 m), weight 263 lb (119.3 kg), last menstrual period 03/16/2021, SpO2 99 %. Body mass index is 43.77 kg/m.  General: She is overweight, cooperative, alert, well developed, and in no acute distress. PSYCH: Has normal mood, affect and thought process.   Cardiovascular: HR 60's regular.  Lungs: Normal breathing effort, no conversational dyspnea. Neuro: no gross deficits.   DIAGNOSTIC DATA REVIEWED:  BMET    Component Value Date/Time   NA 139 06/26/2022 0734   K 4.3 06/26/2022 0734   CL 103 06/26/2022 0734   CO2 23 06/26/2022 0734   GLUCOSE 103 (H) 06/26/2022 0734   GLUCOSE 82 04/10/2021 1127   BUN 14 06/26/2022 0734   CREATININE 0.81 06/26/2022 0734   CALCIUM 9.0 06/26/2022 0734   GFRNONAA >60 04/10/2021 1127   GFRAA 80 03/15/2020 1033   Lab Results  Component Value Date   HGBA1C 5.3 06/26/2022   HGBA1C 6.4 (H) 03/15/2020   Lab Results  Component Value Date   INSULIN 4.0 06/26/2022   INSULIN 30.0 (H) 03/15/2020   Lab Results  Component Value Date   TSH 1.820 06/26/2022   CBC    Component Value Date/Time   WBC 4.2 06/26/2022 0734   WBC 4.7 04/10/2021 1127   RBC 4.12 06/26/2022 0734   RBC 4.62 04/10/2021 1127   HGB 12.7 06/26/2022 0734   HCT 38.6 06/26/2022 0734   PLT 202 06/26/2022 0734   MCV 94 06/26/2022 0734   MCH 30.8 06/26/2022 0734   MCH 30.5 04/10/2021 1127   MCHC 32.9 06/26/2022 0734   MCHC 34.6 04/10/2021 1127   RDW 13.0 06/26/2022 0734   Iron Studies    Component Value Date/Time   IRON 63 07/28/2020 0920   TIBC 274 07/28/2020 0920   FERRITIN 57 07/28/2020 0920   IRONPCTSAT 23 07/28/2020 0920   Lipid Panel     Component Value Date/Time   CHOL 184 06/26/2022 0734   TRIG 67 06/26/2022 0734   HDL 85 06/26/2022 0734   CHOLHDL 3.3 11/23/2021 1236   LDLCALC 86 06/26/2022 0734   Hepatic Function Panel     Component Value Date/Time   PROT 6.3 06/26/2022 0734   ALBUMIN 3.9 06/26/2022 0734    AST 14 06/26/2022 0734   ALT 13 06/26/2022 0734   ALKPHOS 51 06/26/2022 0734   BILITOT 0.3 06/26/2022 0734      Component Value Date/Time   TSH 1.820 06/26/2022 0734   Nutritional Lab Results  Component Value Date   VD25OH 50.1 06/26/2022   VD25OH 95.2 11/23/2021   VD25OH 78.1 05/29/2021    ASSOCIATED CONDITIONS ADDRESSED TODAY  ASSESSMENT AND PLAN  Problem List Items Addressed This Visit     Morbid obesity (HCC)   Relevant Medications   metFORMIN (GLUCOPHAGE) 500 MG tablet   Prediabetes - Primary   Relevant Medications   metFORMIN (GLUCOPHAGE) 500 MG tablet   Essential hypertension   BMI 40.0-44.9, adult (HCC)   Relevant Medications   metFORMIN (GLUCOPHAGE) 500 MG tablet   Hyperlipidemia, pure   Depression (Chronic)   Relevant Medications   buPROPion (WELLBUTRIN SR) 150 MG 12 hr tablet  topiramate (TOPAMAX) 25 MG tablet   Prediabetes Labs were reviewed today and discussed with the patient.  Last A1c was 5.3/ Insulin 4.0- both much improved!!  Medication(s): Metformin 500 mg once daily breakfast No GI side effects with Metformin.   Off Wegovy for several months now. She is working on Engineer, technical sales to decrease simple carbohydrates, increase lean proteins and exercise to promote weight loss, improve glycemic control and prevent progression to Type 2 diabetes.   Polyphagia:Yes Lab Results  Component Value Date   HGBA1C 5.3 06/26/2022   HGBA1C 5.6 11/23/2021   HGBA1C 5.5 05/29/2021   HGBA1C 6.4 (H) 07/28/2020   HGBA1C 6.4 (H) 03/15/2020   Lab Results  Component Value Date   INSULIN 4.0 06/26/2022   INSULIN 11.0 11/23/2021   INSULIN 13.1 05/29/2021   INSULIN 15.0 07/28/2020   INSULIN 30.0 (H) 03/15/2020    Plan: She made excellent progress with A1C/ insulin levels while on GLP 1- Wegovy.  Maximizing other therapies/medications with discontinued Wegovy due to no insurance coverage.  Continue and refill Metformin 500 mg once daily breakfast Also taking  Wellbutrin in am and to start topamax at bedtime. May want to increase metformin to twice daily next.  Continue working on nutrition plan to decrease simple carbohydrates, increase lean proteins and exercise to promote weight loss, improve glycemic control and prevent progression to Type 2 diabetes.    Hyperlipidemia Labs were reviewed today and discussed with the patient.   LDL is at goal and much improved. HDL much improved at 85 and triglycerides improved at 67 !!!! Medication(s): None Cardiovascular risk factors: dyslipidemia, obesity (BMI >= 30 kg/m2), and sedentary lifestyle  Lab Results  Component Value Date   CHOL 184 06/26/2022   HDL 85 06/26/2022   LDLCALC 86 06/26/2022   TRIG 67 06/26/2022   CHOLHDL 3.3 11/23/2021   CHOLHDL 3.5 07/28/2020   CHOLHDL 3.2 03/15/2020   Lab Results  Component Value Date   ALT 13 06/26/2022   AST 14 06/26/2022   ALKPHOS 51 06/26/2022   BILITOT 0.3 06/26/2022   The 10-year ASCVD risk score (Arnett DK, et al., 2019) is: 3.3%   Values used to calculate the score:     Age: 84 years     Sex: Female     Is Non-Hispanic African American: Yes     Diabetic: No     Tobacco smoker: No     Systolic Blood Pressure: 143 mmHg     Is BP treated: Yes     HDL Cholesterol: 85 mg/dL     Total Cholesterol: 184 mg/dL  Plan: She has done very well on nutrition plan to increase lean proteins, limit simple sugars and saturated fats and cholesterol in addition to having been on GLP1- Wegovy.  Continue to work on nutrition plan -decreasing simple carbohydrates, increasing lean proteins, decreasing saturated fats and cholesterol , avoiding trans fats and exercise as able to promote weight loss, improve lipids and decrease cardiovascular risks.  Hypertension Labs were reviewed today and discussed with the patient.  Hypertension asymptomatic, no significant medication side effects noted, borderline controlled, and needs further observation.  Medication(s):  losartan - HCTZ 100-25 mg daily  Renal function is normal. Electrolytes normal.   BP Readings from Last 3 Encounters:  07/03/22 (!) 143/82  06/11/22 138/89  05/09/22 129/81   Lab Results  Component Value Date   CREATININE 0.81 06/26/2022   CREATININE 0.84 11/23/2021   CREATININE 0.94 05/29/2021   No results found for: "GFR"  Plan: Continue all antihypertensives at current dosages. Continue to work on nutrition plan to promote weight loss and improve BP control.    Other depression/emotional eating Pamela [name] has had issues with stress/emotional eating. Currently this is poorly controlled. Overall mood is stable. Medication(s): Bupropion SR 150 mg daily in am No side effects with Wellbutrin.  We discussed starting topamax at bedtime to help with cravings/emotional eating.  Patient denies history of glaucoma or kidney stones. She was informed of side effects and that topiramate is teratogenic. She is S/P hysterectomy.    Plan: Continue and refill and Start Bupropion SR 150 mg daily in am and Topiramate 25 mg at bedtime and increase topiramate to 50 mg after 1 week if tolerates topiramate well.  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    ATTESTASTION STATEMENTS:  Reviewed by clinician on day of visit: allergies, medications, problem list, medical history, surgical history, family history, social history, and previous encounter notes.   I have personally spent 40 minutes total time today in preparation, patient care, nutritional counseling and documentation for this visit, including the following: review of clinical lab tests; review of medical tests/procedures/services.      Kellyjo Edgren, PA-C

## 2022-07-06 ENCOUNTER — Other Ambulatory Visit (HOSPITAL_BASED_OUTPATIENT_CLINIC_OR_DEPARTMENT_OTHER): Payer: Self-pay

## 2022-07-10 ENCOUNTER — Ambulatory Visit (INDEPENDENT_AMBULATORY_CARE_PROVIDER_SITE_OTHER): Payer: BC Managed Care – PPO | Admitting: Family Medicine

## 2022-07-26 ENCOUNTER — Other Ambulatory Visit (HOSPITAL_BASED_OUTPATIENT_CLINIC_OR_DEPARTMENT_OTHER): Payer: Self-pay

## 2022-07-26 ENCOUNTER — Ambulatory Visit (INDEPENDENT_AMBULATORY_CARE_PROVIDER_SITE_OTHER): Payer: BC Managed Care – PPO | Admitting: Family Medicine

## 2022-07-26 ENCOUNTER — Encounter (INDEPENDENT_AMBULATORY_CARE_PROVIDER_SITE_OTHER): Payer: Self-pay | Admitting: Family Medicine

## 2022-07-26 VITALS — BP 123/80 | HR 65 | Temp 98.4°F | Ht 65.0 in | Wt 243.0 lb

## 2022-07-26 DIAGNOSIS — Z6841 Body Mass Index (BMI) 40.0 and over, adult: Secondary | ICD-10-CM

## 2022-07-26 DIAGNOSIS — E669 Obesity, unspecified: Secondary | ICD-10-CM | POA: Diagnosis not present

## 2022-07-26 DIAGNOSIS — F3289 Other specified depressive episodes: Secondary | ICD-10-CM

## 2022-07-26 DIAGNOSIS — R7303 Prediabetes: Secondary | ICD-10-CM

## 2022-07-26 MED ORDER — TOPIRAMATE 25 MG PO TABS
25.0000 mg | ORAL_TABLET | Freq: Every day | ORAL | 0 refills | Status: DC
  Filled 2022-07-26: qty 45, 30d supply, fill #0

## 2022-07-26 MED ORDER — METFORMIN HCL 500 MG PO TABS
500.0000 mg | ORAL_TABLET | Freq: Every day | ORAL | 0 refills | Status: DC
  Filled 2022-07-26: qty 30, 30d supply, fill #0

## 2022-07-26 MED ORDER — BUPROPION HCL ER (SR) 150 MG PO TB12
150.0000 mg | ORAL_TABLET | Freq: Every day | ORAL | 0 refills | Status: DC
  Filled 2022-07-26: qty 30, 30d supply, fill #0

## 2022-07-31 NOTE — Progress Notes (Signed)
Chief Complaint:   OBESITY Pamela [name] is here to discuss her progress with her obesity treatment plan along with follow-up of her obesity related diagnoses. Pamela [name] is on keeping a food journal and adhering to recommended goals of 1400 calories and 80+ grams of protein and states she is following her eating plan approximately 98% of the time. Pamela [name] states she is doing cardio and lifting for 60 minutes 7 times per week.  Today's visit was #: 35 Starting weight: 333 lbs Starting date: 03/15/2020 Today's weight: 243 lbs Today's date: 07/26/2022 Total lbs lost to date: 90 Total lbs lost since last in-office visit: 20  Interim History: Patient is doing well with her weight loss.  She is working on no weight successes.  She is working on meeting her protein goals.  She is doing well with increasing her vegetables and journaling very well, but she is counting her calories too low.  Subjective:   1. Pre-diabetes Patient is on metformin, and she is doing well with decreasing simple carbohydrates.  She denies nausea or vomiting.  2. Emotional Eating Behaviors Patient started Topamax and she is not having any sleepiness.  She is doing well with decreasing emotional eating behavior.  Assessment/Plan:   1. Pre-diabetes Patient will continue metformin, and we will refill for 1 month.  - metFORMIN (GLUCOPHAGE) 500 MG tablet; Take 1 tablet (500 mg total) by mouth daily.  Dispense: 30 tablet; Refill: 0  2. Emotional Eating Behaviors Patient will continue her medications, and we will refill Wellbutrin SR and Topamax for 1 month.  - buPROPion (WELLBUTRIN SR) 150 MG 12 hr tablet; Take 1 tablet (150 mg total) by mouth daily.  Dispense: 30 tablet; Refill: 0 - topiramate (TOPAMAX) 25 MG tablet; Take 1 tablet (25 mg total) by mouth at bedtime. Increase to 2 tablets at bedtime nightly after 1 week if tolerating well.  Dispense: 45 tablet; Refill: 0  3. BMI 40.0-44.9, adult (HCC) Current BMI 43.9  4.  Obesity, Beginning BMI 55.41 Pamela [name] is currently in the action stage of change. As such, her goal is to continue with weight loss efforts. She has agreed to keeping a food journal and adhering to recommended goals of 1300 calories and 90+ grams of protein daily.   Patient needs to increase kcal to 1300 cal to avoid a decrease in RMR.  Exercise goals: As is.   Behavioral modification strategies: increasing lean protein intake.  Pamela [name] has agreed to follow-up with our clinic in 3 to 4 weeks. She was informed of the importance of frequent follow-up visits to maximize her success with intensive lifestyle modifications for her multiple health conditions.   Objective:   Blood pressure 123/80, pulse 65, temperature 98.4 F (36.9 C), height 5\' 5"  (1.651 m), weight 243 lb (110.2 kg), last menstrual period 03/16/2021, SpO2 99 %. Body mass index is 40.44 kg/m.  Lab Results  Component Value Date   CREATININE 0.81 06/26/2022   BUN 14 06/26/2022   NA 139 06/26/2022   K 4.3 06/26/2022   CL 103 06/26/2022   CO2 23 06/26/2022   Lab Results  Component Value Date   ALT 13 06/26/2022   AST 14 06/26/2022   ALKPHOS 51 06/26/2022   BILITOT 0.3 06/26/2022   Lab Results  Component Value Date   HGBA1C 5.3 06/26/2022   HGBA1C 5.6 11/23/2021   HGBA1C 5.5 05/29/2021   HGBA1C 6.4 (H) 07/28/2020   HGBA1C 6.4 (H) 03/15/2020   Lab Results  Component Value Date  INSULIN 4.0 06/26/2022   INSULIN 11.0 11/23/2021   INSULIN 13.1 05/29/2021   INSULIN 15.0 07/28/2020   INSULIN 30.0 (H) 03/15/2020   Lab Results  Component Value Date   TSH 1.820 06/26/2022   Lab Results  Component Value Date   CHOL 184 06/26/2022   HDL 85 06/26/2022   LDLCALC 86 06/26/2022   TRIG 67 06/26/2022   CHOLHDL 3.3 11/23/2021   Lab Results  Component Value Date   VD25OH 50.1 06/26/2022   VD25OH 95.2 11/23/2021   VD25OH 78.1 05/29/2021   Lab Results  Component Value Date   WBC 4.2 06/26/2022   HGB 12.7 06/26/2022    HCT 38.6 06/26/2022   MCV 94 06/26/2022   PLT 202 06/26/2022   Lab Results  Component Value Date   IRON 63 07/28/2020   TIBC 274 07/28/2020   FERRITIN 57 07/28/2020   Attestation Statements:   Reviewed by clinician on day of visit: allergies, medications, problem list, medical history, surgical history, family history, social history, and previous encounter notes.  I have personally spent 40 minutes total time today in preparation, patient care, and documentation for this visit, including the following: review of clinical lab tests; review of medical tests/procedures/services.   I, Burt Knack, am acting as transcriptionist for Quillian Quince, MD.  I have reviewed the above documentation for accuracy and completeness, and I agree with the above. -  Quillian Quince, MD

## 2022-08-14 ENCOUNTER — Ambulatory Visit (INDEPENDENT_AMBULATORY_CARE_PROVIDER_SITE_OTHER): Payer: BC Managed Care – PPO | Admitting: Family Medicine

## 2022-08-22 ENCOUNTER — Ambulatory Visit (INDEPENDENT_AMBULATORY_CARE_PROVIDER_SITE_OTHER): Payer: BC Managed Care – PPO | Admitting: Family Medicine

## 2022-08-22 ENCOUNTER — Other Ambulatory Visit (HOSPITAL_BASED_OUTPATIENT_CLINIC_OR_DEPARTMENT_OTHER): Payer: Self-pay

## 2022-08-22 ENCOUNTER — Encounter (INDEPENDENT_AMBULATORY_CARE_PROVIDER_SITE_OTHER): Payer: Self-pay | Admitting: Family Medicine

## 2022-08-22 VITALS — BP 131/82 | HR 55 | Temp 97.7°F | Ht 65.0 in | Wt 246.0 lb

## 2022-08-22 DIAGNOSIS — E669 Obesity, unspecified: Secondary | ICD-10-CM

## 2022-08-22 DIAGNOSIS — E785 Hyperlipidemia, unspecified: Secondary | ICD-10-CM | POA: Diagnosis not present

## 2022-08-22 DIAGNOSIS — R7303 Prediabetes: Secondary | ICD-10-CM

## 2022-08-22 DIAGNOSIS — F3289 Other specified depressive episodes: Secondary | ICD-10-CM

## 2022-08-22 DIAGNOSIS — Z6841 Body Mass Index (BMI) 40.0 and over, adult: Secondary | ICD-10-CM

## 2022-08-22 DIAGNOSIS — E7849 Other hyperlipidemia: Secondary | ICD-10-CM

## 2022-08-22 MED ORDER — BUPROPION HCL ER (SR) 150 MG PO TB12
150.0000 mg | ORAL_TABLET | Freq: Every day | ORAL | 0 refills | Status: DC
  Filled 2022-08-22: qty 30, 30d supply, fill #0

## 2022-08-22 MED ORDER — METFORMIN HCL 500 MG PO TABS
500.0000 mg | ORAL_TABLET | Freq: Every day | ORAL | 0 refills | Status: DC
  Filled 2022-08-22: qty 30, 30d supply, fill #0

## 2022-08-22 MED ORDER — TOPIRAMATE 25 MG PO TABS
25.0000 mg | ORAL_TABLET | Freq: Every day | ORAL | 0 refills | Status: DC
  Filled 2022-08-22: qty 45, 30d supply, fill #0

## 2022-08-22 NOTE — Progress Notes (Addendum)
[facility]  Office: [phone]  /  Fax: [phone]  WEIGHT SUMMARY AND BIOMETRICS  Anthropometric Measurements Height: 5\' 5"  (1.651 m) Weight: 246 lb (111.6 kg) BMI (Calculated): 40.94 Weight at Last Visit: 243 lb Weight Lost Since Last Visit: 0 Weight Gained Since Last Visit: 3 lb Starting Weight: 333 lb Total Weight Loss (lbs): 87 lb (39.5 kg)   Body Composition  Body Fat %: 42.6 % Fat Mass (lbs): 105.2 lbs Muscle Mass (lbs): 134.4 lbs Total Body Water (lbs): 100.6 lbs Visceral Fat Rating : 13   Other Clinical Data Fasting: No Labs: No Today's Visit #: 36 Starting Date: 03/15/20    Chief Complaint: OBESITY    History of Present Illness   The patient is a 52 year old individual with a history of obesity, prediabetes, and depression with emotional eating behaviors. She had lost 20 pounds in the previous month but gained 3 pounds in the current month, which she attributes to a water shift. She has been maintaining a diet with a calorie goal of 1300 and a protein goal of 90 grams or more, which she adheres to approximately 75% of the time. She has also been engaging in cardio and strengthening exercises for 60 to 90 minutes, five times per week.  However, recent lifestyle changes, including a trip to [place] and starting a new job, have disrupted her routine. Despite these disruptions, she managed to work out three times during her trip and has been intentional about her meal choices. She has been struggling to return to her routine of meal prepping and journaling since her return from [place].  She has been taking bupropion and topiramate for her depression and emotional eating behaviors, which she reports has helped with cravings. She has also been taking metformin for her prediabetes. She has noticed a discrepancy in her bupropion supply, which seems to run out faster than expected.  The patient has been making dietary changes to manage her hyperlipidemia,  including switching to egg whites and adding low-fat cottage cheese to her meals. She has also been mindful of her cholesterol intake. Despite the challenges, she has seen improvements in her cholesterol levels with these dietary changes. She is determined to get back on track with her meal planning and journaling to continue her weight loss journey.          PHYSICAL EXAM:  Blood pressure 131/82, pulse (!) 55, temperature 97.7 F (36.5 C), height 5\' 5"  (1.651 m), weight 246 lb (111.6 kg), last menstrual period 03/16/2021, SpO2 99%. Body mass index is 40.94 kg/m.  DIAGNOSTIC DATA REVIEWED:  BMET    Component Value Date/Time   NA 139 06/26/2022 0734   K 4.3 06/26/2022 0734   CL 103 06/26/2022 0734   CO2 23 06/26/2022 0734   GLUCOSE 103 (H) 06/26/2022 0734   GLUCOSE 82 04/10/2021 1127   BUN 14 06/26/2022 0734   CREATININE 0.81 06/26/2022 0734   CALCIUM 9.0 06/26/2022 0734   GFRNONAA >60 04/10/2021 1127   GFRAA 80 03/15/2020 1033   Lab Results  Component Value Date   HGBA1C 5.3 06/26/2022   HGBA1C 6.4 (H) 03/15/2020   Lab Results  Component Value Date   INSULIN 4.0 06/26/2022   INSULIN 30.0 (H) 03/15/2020   Lab Results  Component Value Date   TSH 1.820 06/26/2022   CBC    Component Value Date/Time   WBC 4.2 06/26/2022 0734   WBC 4.7 04/10/2021 1127   RBC 4.12 06/26/2022 0734   RBC 4.62 04/10/2021 1127  HGB 12.7 06/26/2022 0734   HCT 38.6 06/26/2022 0734   PLT 202 06/26/2022 0734   MCV 94 06/26/2022 0734   MCH 30.8 06/26/2022 0734   MCH 30.5 04/10/2021 1127   MCHC 32.9 06/26/2022 0734   MCHC 34.6 04/10/2021 1127   RDW 13.0 06/26/2022 0734   Iron Studies    Component Value Date/Time   IRON 63 07/28/2020 0920   TIBC 274 07/28/2020 0920   FERRITIN 57 07/28/2020 0920   IRONPCTSAT 23 07/28/2020 0920   Lipid Panel     Component Value Date/Time   CHOL 184 06/26/2022 0734   TRIG 67 06/26/2022 0734   HDL 85 06/26/2022 0734   CHOLHDL 3.3 11/23/2021 1236    LDLCALC 86 06/26/2022 0734   Hepatic Function Panel     Component Value Date/Time   PROT 6.3 06/26/2022 0734   ALBUMIN 3.9 06/26/2022 0734   AST 14 06/26/2022 0734   ALT 13 06/26/2022 0734   ALKPHOS 51 06/26/2022 0734   BILITOT 0.3 06/26/2022 0734      Component Value Date/Time   TSH 1.820 06/26/2022 0734   Nutritional Lab Results  Component Value Date   VD25OH 50.1 06/26/2022   VD25OH 95.2 11/23/2021   VD25OH 78.1 05/29/2021     Assessment and Plan    Obesity: Recent weight gain of 3 pounds, likely due to water shift. Patient is actively engaged in diet and exercise, with a goal of 1300 calories and 90 grams of protein daily. She is exercising 60-90 minutes five times per week, focusing on both cardio and strength training. Recent changes in routine (travel, new job) have disrupted her meal planning and exercise schedule. -Continue current diet and exercise regimen. -Encourage consistent meal planning and tracking. -Encourage consistent exercise, even with changes in routine.  Prediabetes: Controlled with Metformin. -Continue Metformin, diet and exercise -Plan to recheck labs in the fall.  Depression with emotional eating behaviors: Controlled with Bupropion and Topiramate. Patient reports these medications are helping with cravings. -Continue Bupropion and Topiramate and continue to monitor  Hyperlipidemia: Well controlled with diet and exercise. Recent labs show LDL of 86, HDL of 85, and triglycerides within normal range. -Continue current diet and exercise regimen.       She was informed of the importance of frequent follow up visits to maximize her success with intensive lifestyle modifications for her multiple health conditions.    Quillian Quince, MD

## 2022-09-06 ENCOUNTER — Ambulatory Visit (INDEPENDENT_AMBULATORY_CARE_PROVIDER_SITE_OTHER): Payer: BC Managed Care – PPO | Admitting: Family Medicine

## 2022-09-09 NOTE — Progress Notes (Unsigned)
TeleHealth Visit:  This visit was completed with telemedicine (audio/video) technology. Pamela [name] has verbally consented to this TeleHealth visit. The patient is located at home, the provider is located at home. The participants in this visit include the listed provider and patient. The visit was conducted today via MyChart video.  OBESITY Pamela [name] is here to discuss her progress with her obesity treatment plan along with follow-up of her obesity related diagnoses.   Today's visit was # 37 Starting weight: 333 lbs Starting date: 03/15/20 Weight at last in office visit: 246 lbs on 08/22/22 Total weight loss: 87 lbs at last in office visit on 08/22/22. Today's reported weight (09/10/22): none reported  Nutrition Plan: keeping a food journal with goal of 1300 calories and 90 grams of protein daily -   Current exercise: Has not exercises in the past week. Was doing cardio and strengthening exercises for 60 to 90 minutes, five times per week.   Interim History:  She is doing an excellent job with planning and prepping breakfast and lunch. Struggles with food choices in the evening. She often will have dinner planned but then decide that she doesn't want what she planned.  Sometimes uses [employer]. Has 50-60 gms of protein by lunch. Eating starts going downhill after 3:30 pm. Journals all morning and at lunch but then falls off in the evening as her for food choices deteriorate.   Drinking adequate water: Yes Drinking sugar sweetened beverages: No Hunger controlled: moderately controlled. Cravings controlled:  poorly controlled.  Assessment/Plan:  1. Other depression/emotional eating Pamela [name] has had issues with stress/emotional eating. Currently this is moderately controlled. Overall mood is stable. Struggles with snacking in the evening. Forgets Topamax at times which she feels is helping but she forgets to take it. She feels she may remember to take it better earlier in the  day. Medication(s): Bupropion SR 150 mg daily in am and Topiramate 50 mg qhs  Plan: Continue and refill Bupropion SR 150 mg daily in am Refill-Topamax 25 mg tablet-take 2 tablets with lunch.  2. Hypertension Hypertension well controlled.  Medication(s): Losartan-HCTZ 100-25 mg daily  BP Readings from Last 3 Encounters:  08/22/22 131/82  07/26/22 123/80  07/03/22 (!) 143/82   Lab Results  Component Value Date   CREATININE 0.81 06/26/2022   CREATININE 0.84 11/23/2021   CREATININE 0.94 05/29/2021   No results found for: "GFR"  Plan: Continue all antihypertensives at current dosages.   3. Morbid Obesity: Current BMI 40  Pamela [name] is currently in the action stage of change. As such, her goal is to continue with weight loss efforts.  She has agreed to keeping a food journal with goal of 1300 calories and 90 grams of protein daily.  1.  Recipe sent via MyChart. 2.  Discussed having protein snack on the way home from work. 3.  Never shop on an empty stomach. 4.  Discussed using crockpot to have food ready when she gets home from work. 5.  Keep junk food out of the house. 6.  Continue journaling through the evening even if she is off plan.  Exercise goals: Resume normal exercise routine  Behavioral modification strategies: increasing lean protein intake, decreasing simple carbohydrates , meal planning , planning for success, get rid of junk food in the home, keep a strict food journal, and increase frequency of journaling.  Pamela [name] has agreed to follow-up with our clinic in 3 weeks.  No orders of the defined types were placed in this encounter.   Medications Discontinued  During This Encounter  Medication Reason   buPROPion (WELLBUTRIN SR) 150 MG 12 hr tablet Reorder   topiramate (TOPAMAX) 25 MG tablet Reorder   topiramate (TOPAMAX) 25 MG tablet      Meds ordered this encounter  Medications   DISCONTD: topiramate (TOPAMAX) 25 MG tablet    Sig: Take 1 tablet (25 mg total) by  mouth at bedtime. Increase to 2 tablets at bedtime nightly after 1 week if tolerating well.    Dispense:  45 tablet    Refill:  0    Order Specific Question:   Supervising Provider    Answer:   Glennis Brink [[id number]]   buPROPion (WELLBUTRIN SR) 150 MG 12 hr tablet    Sig: Take 1 tablet (150 mg total) by mouth daily.    Dispense:  30 tablet    Refill:  0    Order Specific Question:   Supervising Provider    Answer:   Glennis Brink [[id number]]   topiramate (TOPAMAX) 25 MG tablet    Sig: Take 2 tablets (50 mg total) by mouth daily with lunch.    Dispense:  60 tablet    Refill:  0    Order Specific Question:   Supervising Provider    Answer:   Glennis Brink [[id number]]      Objective:   VITALS: Per patient if applicable, see vitals. GENERAL: Alert and in no acute distress. CARDIOPULMONARY: No increased WOB. Speaking in clear sentences.  PSYCH: Pleasant and cooperative. Speech normal rate and rhythm. Affect is appropriate. Insight and judgement are appropriate. Attention is focused, linear, and appropriate.  NEURO: Oriented as arrived to appointment on time with no prompting.   Attestation Statements:   Reviewed by clinician on day of visit: allergies, medications, problem list, medical history, surgical history, family history, social history, and previous encounter notes.  This was prepared with the assistance of Engineer, civil (consulting).  Occasional wrong-word or sound-a-like substitutions may have occurred due to the inherent limitations of voice recognition software.

## 2022-09-10 ENCOUNTER — Encounter (INDEPENDENT_AMBULATORY_CARE_PROVIDER_SITE_OTHER): Payer: Self-pay | Admitting: Family Medicine

## 2022-09-10 ENCOUNTER — Telehealth (INDEPENDENT_AMBULATORY_CARE_PROVIDER_SITE_OTHER): Payer: BC Managed Care – PPO | Admitting: Family Medicine

## 2022-09-10 ENCOUNTER — Other Ambulatory Visit (HOSPITAL_BASED_OUTPATIENT_CLINIC_OR_DEPARTMENT_OTHER): Payer: Self-pay

## 2022-09-10 DIAGNOSIS — F3289 Other specified depressive episodes: Secondary | ICD-10-CM

## 2022-09-10 DIAGNOSIS — I10 Essential (primary) hypertension: Secondary | ICD-10-CM | POA: Diagnosis not present

## 2022-09-10 DIAGNOSIS — Z6841 Body Mass Index (BMI) 40.0 and over, adult: Secondary | ICD-10-CM

## 2022-09-10 MED ORDER — TOPIRAMATE 25 MG PO TABS
25.0000 mg | ORAL_TABLET | Freq: Every day | ORAL | 0 refills | Status: DC
  Filled 2022-09-10: qty 45, 45d supply, fill #0

## 2022-09-10 MED ORDER — BUPROPION HCL ER (SR) 150 MG PO TB12
150.0000 mg | ORAL_TABLET | Freq: Every day | ORAL | 0 refills | Status: DC
  Filled 2022-09-10: qty 30, 30d supply, fill #0

## 2022-09-10 MED ORDER — TOPIRAMATE 25 MG PO TABS
50.0000 mg | ORAL_TABLET | Freq: Every day | ORAL | 0 refills | Status: DC
  Filled 2022-09-10: qty 60, 30d supply, fill #0

## 2022-10-03 ENCOUNTER — Encounter (INDEPENDENT_AMBULATORY_CARE_PROVIDER_SITE_OTHER): Payer: Self-pay | Admitting: Family Medicine

## 2022-10-03 ENCOUNTER — Other Ambulatory Visit (HOSPITAL_BASED_OUTPATIENT_CLINIC_OR_DEPARTMENT_OTHER): Payer: Self-pay

## 2022-10-03 ENCOUNTER — Ambulatory Visit (INDEPENDENT_AMBULATORY_CARE_PROVIDER_SITE_OTHER): Payer: BC Managed Care – PPO | Admitting: Family Medicine

## 2022-10-03 VITALS — BP 127/80 | HR 53 | Temp 98.0°F | Ht 65.0 in | Wt 249.0 lb

## 2022-10-03 DIAGNOSIS — Z6841 Body Mass Index (BMI) 40.0 and over, adult: Secondary | ICD-10-CM | POA: Diagnosis not present

## 2022-10-03 DIAGNOSIS — R7303 Prediabetes: Secondary | ICD-10-CM | POA: Diagnosis not present

## 2022-10-03 DIAGNOSIS — E669 Obesity, unspecified: Secondary | ICD-10-CM

## 2022-10-03 DIAGNOSIS — F3289 Other specified depressive episodes: Secondary | ICD-10-CM | POA: Diagnosis not present

## 2022-10-03 MED ORDER — METFORMIN HCL 500 MG PO TABS
500.0000 mg | ORAL_TABLET | Freq: Every day | ORAL | 0 refills | Status: DC
  Filled 2022-10-03: qty 30, 30d supply, fill #0

## 2022-10-03 MED ORDER — TOPIRAMATE 25 MG PO TABS
50.0000 mg | ORAL_TABLET | Freq: Every day | ORAL | 0 refills | Status: DC
  Filled 2022-10-03: qty 60, 30d supply, fill #0

## 2022-10-03 MED ORDER — BUPROPION HCL ER (SR) 200 MG PO TB12
200.0000 mg | ORAL_TABLET | Freq: Every morning | ORAL | 0 refills | Status: DC
  Filled 2022-10-03: qty 30, 30d supply, fill #0

## 2022-10-04 NOTE — Progress Notes (Signed)
Chief Complaint:   OBESITY Pamela [name] is here to discuss her progress with her obesity treatment plan along with follow-up of her obesity related diagnoses. Pamela [name] is on keeping a food journal and adhering to recommended goals of 1300 calories and 90+ grams of protein and states she is following her eating plan approximately 60% of the time. Pamela [name] states she is doing cardio for 60 minutes 1 time per week.  Today's visit was #: 38 Starting weight: 333 lbs Starting date: 03/15/2020 Today's weight: 249 lbs Today's date: 10/03/2022 Total lbs lost to date: 84 Total lbs lost since last in-office visit: 0  Interim History: Patient has struggled with snacking at night, and even some binge eating all evening instead of eating dinner.  She notes some increased stress which may be contributing.  Subjective:   1. Pre-diabetes Patient is on metformin, and she is working on decreasing simple carbohydrates to prevent diabetes mellitus.  No side effects were noted.  2. Emotional Eating Behaviors Patient continues to work on decreasing emotional eating behavior.  She is still struggling however.  No side effects were noted on her current medications.  Assessment/Plan:   1. Pre-diabetes Patient will continue metformin 500 mg once daily, and we will refill for 1 month.  - metFORMIN (GLUCOPHAGE) 500 MG tablet; Take 1 tablet (500 mg total) by mouth daily.  Dispense: 30 tablet; Refill: 0  2. Emotional Eating Behaviors Patient will continue her medications, and we will refill Wellbutrin SR 200 mg every morning and Topamax 50 mg once daily for 1 month.  We will follow-up at her next visit in 3 weeks.  - topiramate (TOPAMAX) 25 MG tablet; Take 2 tablets (50 mg total) by mouth daily with lunch.  Dispense: 60 tablet; Refill: 0 - buPROPion (WELLBUTRIN SR) 200 MG 12 hr tablet; Take 1 tablet (200 mg total) by mouth every morning.  Dispense: 30 tablet; Refill: 0  3. BMI 40.0-44.9, adult (HCC)  4. Obesity,  Beginning BMI 55.41 Pamela [name] is currently in the action stage of change. As such, her goal is to continue with weight loss efforts. She has agreed to keeping a food journal and adhering to recommended goals of 1300 calories and 90+ grams of protein daily.   Exercise goals: As is.   Behavioral modification strategies: increasing lean protein intake and emotional eating strategies.  Pamela [name] has agreed to follow-up with our clinic in 3 weeks. She was informed of the importance of frequent follow-up visits to maximize her success with intensive lifestyle modifications for her multiple health conditions.   Objective:   Blood pressure 127/80, pulse (!) 53, temperature 98 F (36.7 C), height 5\' 5"  (1.651 m), weight 249 lb (112.9 kg), last menstrual period 03/16/2021, SpO2 98%. Body mass index is 41.44 kg/m.  Lab Results  Component Value Date   CREATININE 0.81 06/26/2022   BUN 14 06/26/2022   NA 139 06/26/2022   K 4.3 06/26/2022   CL 103 06/26/2022   CO2 23 06/26/2022   Lab Results  Component Value Date   ALT 13 06/26/2022   AST 14 06/26/2022   ALKPHOS 51 06/26/2022   BILITOT 0.3 06/26/2022   Lab Results  Component Value Date   HGBA1C 5.3 06/26/2022   HGBA1C 5.6 11/23/2021   HGBA1C 5.5 05/29/2021   HGBA1C 6.4 (H) 07/28/2020   HGBA1C 6.4 (H) 03/15/2020   Lab Results  Component Value Date   INSULIN 4.0 06/26/2022   INSULIN 11.0 11/23/2021   INSULIN 13.1 05/29/2021  INSULIN 15.0 07/28/2020   INSULIN 30.0 (H) 03/15/2020   Lab Results  Component Value Date   TSH 1.820 06/26/2022   Lab Results  Component Value Date   CHOL 184 06/26/2022   HDL 85 06/26/2022   LDLCALC 86 06/26/2022   TRIG 67 06/26/2022   CHOLHDL 3.3 11/23/2021   Lab Results  Component Value Date   VD25OH 50.1 06/26/2022   VD25OH 95.2 11/23/2021   VD25OH 78.1 05/29/2021   Lab Results  Component Value Date   WBC 4.2 06/26/2022   HGB 12.7 06/26/2022   HCT 38.6 06/26/2022   MCV 94 06/26/2022   PLT 202  06/26/2022   Lab Results  Component Value Date   IRON 63 07/28/2020   TIBC 274 07/28/2020   FERRITIN 57 07/28/2020   Attestation Statements:   Reviewed by clinician on day of visit: allergies, medications, problem list, medical history, surgical history, family history, social history, and previous encounter notes.  Time spent on visit including pre-visit chart review and post-visit care and charting was 40 minutes.   I, Burt Knack, am acting as transcriptionist for Quillian Quince, MD.  I have reviewed the above documentation for accuracy and completeness, and I agree with the above. -  Quillian Quince, MD

## 2022-10-08 ENCOUNTER — Other Ambulatory Visit: Payer: Self-pay

## 2022-10-08 ENCOUNTER — Other Ambulatory Visit: Payer: Self-pay | Admitting: Family Medicine

## 2022-10-09 ENCOUNTER — Other Ambulatory Visit (HOSPITAL_COMMUNITY): Payer: Self-pay

## 2022-10-09 MED ORDER — PANTOPRAZOLE SODIUM 40 MG PO TBEC
40.0000 mg | DELAYED_RELEASE_TABLET | Freq: Every day | ORAL | 0 refills | Status: DC
  Filled 2022-10-09 – 2022-10-19 (×2): qty 90, 90d supply, fill #0

## 2022-10-18 ENCOUNTER — Other Ambulatory Visit (HOSPITAL_COMMUNITY): Payer: Self-pay

## 2022-10-19 ENCOUNTER — Other Ambulatory Visit (HOSPITAL_COMMUNITY): Payer: Self-pay

## 2022-10-19 ENCOUNTER — Other Ambulatory Visit (HOSPITAL_BASED_OUTPATIENT_CLINIC_OR_DEPARTMENT_OTHER): Payer: Self-pay

## 2022-10-23 ENCOUNTER — Other Ambulatory Visit (HOSPITAL_COMMUNITY): Payer: Self-pay

## 2022-11-07 ENCOUNTER — Ambulatory Visit (INDEPENDENT_AMBULATORY_CARE_PROVIDER_SITE_OTHER): Payer: BC Managed Care – PPO | Admitting: Family Medicine

## 2022-11-07 ENCOUNTER — Encounter (INDEPENDENT_AMBULATORY_CARE_PROVIDER_SITE_OTHER): Payer: Self-pay | Admitting: Family Medicine

## 2022-11-07 ENCOUNTER — Other Ambulatory Visit (HOSPITAL_BASED_OUTPATIENT_CLINIC_OR_DEPARTMENT_OTHER): Payer: Self-pay

## 2022-11-07 VITALS — BP 123/79 | HR 61 | Temp 98.1°F | Ht 65.0 in | Wt 249.0 lb

## 2022-11-07 DIAGNOSIS — Z6841 Body Mass Index (BMI) 40.0 and over, adult: Secondary | ICD-10-CM

## 2022-11-07 DIAGNOSIS — F3289 Other specified depressive episodes: Secondary | ICD-10-CM

## 2022-11-07 DIAGNOSIS — R7303 Prediabetes: Secondary | ICD-10-CM | POA: Diagnosis not present

## 2022-11-07 DIAGNOSIS — F5089 Other specified eating disorder: Secondary | ICD-10-CM

## 2022-11-07 DIAGNOSIS — E669 Obesity, unspecified: Secondary | ICD-10-CM

## 2022-11-07 MED ORDER — BUPROPION HCL ER (SR) 200 MG PO TB12
200.0000 mg | ORAL_TABLET | Freq: Every morning | ORAL | 0 refills | Status: DC
  Filled 2022-11-07: qty 30, 30d supply, fill #0

## 2022-11-07 MED ORDER — TOPIRAMATE 25 MG PO TABS
50.0000 mg | ORAL_TABLET | Freq: Every day | ORAL | 0 refills | Status: DC
  Filled 2022-11-07: qty 60, 30d supply, fill #0

## 2022-11-07 MED ORDER — METFORMIN HCL 500 MG PO TABS
500.0000 mg | ORAL_TABLET | Freq: Every day | ORAL | 0 refills | Status: DC
  Filled 2022-11-07: qty 30, 30d supply, fill #0

## 2022-11-08 NOTE — Progress Notes (Signed)
Chief Complaint:   OBESITY Pamela [name] is here to discuss her progress with her obesity treatment plan along with follow-up of her obesity related diagnoses. Pamela [name] is on keeping a food journal and adhering to recommended goals of 1300 calories and 90+ grams of protein and states she is following her eating plan approximately 60% of the time. Pamela [name] states she is doing 0 minutes 0 times per week.  Today's visit was #: 39 Starting weight: 333 lbs Starting date: 03/15/2020 Today's weight: 249 lbs Today's date: 11/07/2022 Total lbs lost to date: 84 Total lbs lost since last in-office visit: 0  Interim History: Patient has done well with her diet and maintaining her weight loss.  She is still working on meal planning and prepping.  Subjective:   1. Pre-diabetes Patient is working on her diet and weight loss.  She denies nausea, vomiting, or hypoglycemia.  2. Emotional Eating Behaviors Patient is working on decreasing emotional eating behavior.  She is on Topamax and Wellbutrin.  No side effects were noted.  Assessment/Plan:   1. Pre-diabetes Patient will continue metformin 500 mg once daily, and we will refill for 1 month.  - metFORMIN (GLUCOPHAGE) 500 MG tablet; Take 1 tablet (500 mg total) by mouth daily.  Dispense: 30 tablet; Refill: 0  2. Emotional Eating Behaviors Patient will continue her medications, and we will refill Topamax and Wellbutrin SR for 1 month.  - topiramate (TOPAMAX) 25 MG tablet; Take 2 tablets (50 mg total) by mouth daily with lunch.  Dispense: 60 tablet; Refill: 0 - buPROPion (WELLBUTRIN SR) 200 MG 12 hr tablet; Take 1 tablet (200 mg total) by mouth every morning.  Dispense: 30 tablet; Refill: 0  3. BMI 40.0-44.9, adult (HCC)  4. Obesity, Beginning BMI 55.41 Pamela [name] is currently in the action stage of change. As such, her goal is to continue with weight loss efforts. She has agreed to keeping a food journal and adhering to recommended goals of 1300 calories and  90+ grams of protein daily.   Recipes were discussed and handouts were given today.   Behavioral modification strategies: meal planning and cooking strategies.  Pamela [name] has agreed to follow-up with our clinic in 4 weeks. She was informed of the importance of frequent follow-up visits to maximize her success with intensive lifestyle modifications for her multiple health conditions.   Objective:   Blood pressure 123/79, pulse 61, temperature 98.1 F (36.7 C), height 5\' 5"  (1.651 m), weight 249 lb (112.9 kg), last menstrual period 03/16/2021, SpO2 100%. Body mass index is 41.44 kg/m.  Lab Results  Component Value Date   CREATININE 0.81 06/26/2022   BUN 14 06/26/2022   NA 139 06/26/2022   K 4.3 06/26/2022   CL 103 06/26/2022   CO2 23 06/26/2022   Lab Results  Component Value Date   ALT 13 06/26/2022   AST 14 06/26/2022   ALKPHOS 51 06/26/2022   BILITOT 0.3 06/26/2022   Lab Results  Component Value Date   HGBA1C 5.3 06/26/2022   HGBA1C 5.6 11/23/2021   HGBA1C 5.5 05/29/2021   HGBA1C 6.4 (H) 07/28/2020   HGBA1C 6.4 (H) 03/15/2020   Lab Results  Component Value Date   INSULIN 4.0 06/26/2022   INSULIN 11.0 11/23/2021   INSULIN 13.1 05/29/2021   INSULIN 15.0 07/28/2020   INSULIN 30.0 (H) 03/15/2020   Lab Results  Component Value Date   TSH 1.820 06/26/2022   Lab Results  Component Value Date   CHOL 184 06/26/2022  HDL 85 06/26/2022   LDLCALC 86 06/26/2022   TRIG 67 06/26/2022   CHOLHDL 3.3 11/23/2021   Lab Results  Component Value Date   VD25OH 50.1 06/26/2022   VD25OH 95.2 11/23/2021   VD25OH 78.1 05/29/2021   Lab Results  Component Value Date   WBC 4.2 06/26/2022   HGB 12.7 06/26/2022   HCT 38.6 06/26/2022   MCV 94 06/26/2022   PLT 202 06/26/2022   Lab Results  Component Value Date   IRON 63 07/28/2020   TIBC 274 07/28/2020   FERRITIN 57 07/28/2020   Attestation Statements:   Reviewed by clinician on day of visit: allergies, medications,  problem list, medical history, surgical history, family history, social history, and previous encounter notes.   I, Burt Knack, am acting as transcriptionist for Quillian Quince, MD.  I have reviewed the above documentation for accuracy and completeness, and I agree with the above. -  Quillian Quince, MD

## 2022-11-12 ENCOUNTER — Other Ambulatory Visit (HOSPITAL_BASED_OUTPATIENT_CLINIC_OR_DEPARTMENT_OTHER): Payer: Self-pay

## 2022-11-12 MED ORDER — INFLUENZA VIRUS VACC SPLIT PF (FLUZONE) 0.5 ML IM SUSY
0.5000 mL | PREFILLED_SYRINGE | Freq: Once | INTRAMUSCULAR | 0 refills | Status: AC
  Filled 2022-11-12: qty 0.5, 1d supply, fill #0

## 2022-11-12 MED ORDER — COVID-19 MRNA VAC-TRIS(PFIZER) 30 MCG/0.3ML IM SUSY
0.3000 mL | PREFILLED_SYRINGE | Freq: Once | INTRAMUSCULAR | 0 refills | Status: AC
  Filled 2022-11-12: qty 0.3, 1d supply, fill #0

## 2022-11-20 ENCOUNTER — Ambulatory Visit: Payer: Self-pay

## 2022-11-20 NOTE — Telephone Encounter (Signed)
Patient called, left VM to return the call to the office to speak to the NT.    Summary: Possible UTI, no appt   Pt has a potential UTI, wants to be seen soon -however nothing is available. Best contact: [phone]

## 2022-11-20 NOTE — Telephone Encounter (Signed)
Left message to call back about symptoms.

## 2022-11-20 NOTE — Telephone Encounter (Signed)
Chief Complaint: UTI follow-up Symptoms: Discomfort  Frequency: Ongoing Pertinent Negatives: Patient denies pain, burning, itching, fever, discharge  Disposition: [] ED /[] Urgent Care (no appt availability in office) / [] Appointment(In office/virtual)/ []  [facility]/ [x] Home Care/ [] Refused Recommended Disposition /[] [facility]/ []  Follow-up with PCP Additional Notes: Patient states she went to urgent care and was treated for a UTI on 11/10/22. Patient was prescribed Macrobid 100 MG BID x 7 days. Patient stated she completed the antibiotics on Sunday and  she feels much better but she is concerned that it may comeback because she had a UTI is September as well. Patient wants to get check to ensure the UTI is gone. Care advice was given and patient was offered a follow-up appointment with PCP but patient can not make it to the appointment. Urgent care resources given if needed. Patient states she would callback if symptoms return.  Summary: Possible UTI, no appt   Pt has a potential UTI, wants to be seen soon -however nothing is available. Best contact: [phone]     Reason for Disposition  [1] Reasonable improvement on antibiotics AND [2] no fever  Answer Assessment - Initial Assessment Questions 1. MAIN SYMPTOM: "What is the main symptom you are concerned about?" (e.g., painful urination, urine frequency)     Discomfort  2. BETTER-SAME-WORSE: "Are you getting better, staying the same, or getting worse compared to how you felt at your last visit to the doctor (most recent medical visit)?"     Better  3. PAIN: "How bad is the pain?"  (e.g., Scale 1-10; mild, moderate, or severe)   - MILD (1-3): complains slightly about urination hurting   - MODERATE (4-7): interferes with normal activities     - SEVERE (8-10): excruciating, unwilling or unable to urinate because of the pain      Mild  4. FEVER: "Do you have a fever?" If Yes, ask: "What is it, how was it measured, and  when did it start?"     No  5. OTHER SYMPTOMS: "Do you have any other symptoms?" (e.g., blood in the urine, flank pain, vaginal discharge)     No 6. DIAGNOSIS: "When was the UTI diagnosed?" "By whom?" "Was it a kidney infection, bladder infection or both?"     10 /5/27 x 7 days  7. ANTIBIOTIC: "What antibiotic(s) are you taking?" "How many times per day?"     BID x 7 days  8. ANTIBIOTIC - START DATE: "When did you start taking the antibiotic?"     I started on 11/10/22 and finished taking on Sunday  Protocols used: Urinary Tract Infection on Antibiotic Follow-up Call - [facility]

## 2022-12-04 ENCOUNTER — Encounter (INDEPENDENT_AMBULATORY_CARE_PROVIDER_SITE_OTHER): Payer: Self-pay | Admitting: Family Medicine

## 2022-12-04 ENCOUNTER — Other Ambulatory Visit (HOSPITAL_BASED_OUTPATIENT_CLINIC_OR_DEPARTMENT_OTHER): Payer: Self-pay

## 2022-12-04 ENCOUNTER — Ambulatory Visit (INDEPENDENT_AMBULATORY_CARE_PROVIDER_SITE_OTHER): Payer: BC Managed Care – PPO | Admitting: Family Medicine

## 2022-12-04 VITALS — BP 115/71 | HR 55 | Temp 98.4°F | Ht 65.0 in | Wt 250.0 lb

## 2022-12-04 DIAGNOSIS — F3289 Other specified depressive episodes: Secondary | ICD-10-CM

## 2022-12-04 DIAGNOSIS — Z6841 Body Mass Index (BMI) 40.0 and over, adult: Secondary | ICD-10-CM

## 2022-12-04 DIAGNOSIS — R7303 Prediabetes: Secondary | ICD-10-CM

## 2022-12-04 DIAGNOSIS — F5089 Other specified eating disorder: Secondary | ICD-10-CM

## 2022-12-04 DIAGNOSIS — E669 Obesity, unspecified: Secondary | ICD-10-CM

## 2022-12-04 MED ORDER — BUPROPION HCL ER (SR) 200 MG PO TB12
200.0000 mg | ORAL_TABLET | Freq: Every morning | ORAL | 0 refills | Status: DC
  Filled 2022-12-04: qty 30, 30d supply, fill #0

## 2022-12-04 MED ORDER — TOPIRAMATE 50 MG PO TABS
50.0000 mg | ORAL_TABLET | Freq: Two times a day (BID) | ORAL | 0 refills | Status: DC
  Filled 2022-12-04: qty 60, 30d supply, fill #0

## 2022-12-04 MED ORDER — METFORMIN HCL 500 MG PO TABS
500.0000 mg | ORAL_TABLET | Freq: Every day | ORAL | 0 refills | Status: DC
  Filled 2022-12-04: qty 30, 30d supply, fill #0

## 2022-12-04 NOTE — Progress Notes (Unsigned)
Chief Complaint:   OBESITY Pamela [name] is here to discuss her progress with her obesity treatment plan along with follow-up of her obesity related diagnoses. Pamela [name] is on keeping a food journal and adhering to recommended goals of 1300 calories and 90+ grams of protein and states she is following her eating plan approximately 40% of the time. Pamela [name] states she is doing 0 minutes 0 times per week.  Today's visit was #: 40 Starting weight: 333 lbs Starting date: 03/15/2020 Today's weight: 250 lbs Today's date: 12/04/2022 Total lbs lost to date: 83 Total lbs lost since last in-office visit: 0  Interim History: Patient is working on Orthoptist.  She does well with meeting her protein goals, but she is struggling to meet her calorie goals.  She hurt her back recently but she plans to increase her activity as she feels better.  Subjective:   1. Pre-diabetes Patient is on metformin, and she is working on her diet but she is struggling at times.  2. Emotional Eating Behaviors Patient feels she needs an increase in her medications to help her decrease emotional eating behavior.  No side effects were noted on her current dose.  Assessment/Plan:   1. Pre-diabetes We will refill metformin 500 mg once daily for 1 month.  Patient will continue with her diet, exercise, and weight loss.  - metFORMIN (GLUCOPHAGE) 500 MG tablet; Take 1 tablet (500 mg total) by mouth daily.  Dispense: 30 tablet; Refill: 0  2. Emotional Eating Behaviors Patient agreed to increase Topamax to 50 mg twice daily, and we will refill for 1 month; we will refill Wellbutrin SR 200 mg every morning for 1 month.  - topiramate (TOPAMAX) 50 MG tablet; Take 1 tablet (50 mg total) by mouth 2 (two) times daily.  Dispense: 60 tablet; Refill: 0 - buPROPion (WELLBUTRIN SR) 200 MG 12 hr tablet; Take 1 tablet (200 mg total) by mouth every morning.  Dispense: 30 tablet; Refill: 0  3. BMI 40.0-44.9, adult (HCC)  4. Obesity, Beginning BMI  55.41 Pamela [name] is currently in the action stage of change. As such, her goal is to continue with weight loss efforts. She has agreed to the Category 2 Plan or keeping a food journal and adhering to recommended goals of 1200-1300 calories and 75+ grams of protein daily.   Behavioral modification strategies: increasing lean protein intake.  Pamela [name] has agreed to follow-up with our clinic in 4 weeks. She was informed of the importance of frequent follow-up visits to maximize her success with intensive lifestyle modifications for her multiple health conditions.   Objective:   Blood pressure 115/71, pulse (!) 55, temperature 98.4 F (36.9 C), height 5\' 5"  (1.651 m), weight 250 lb (113.4 kg), last menstrual period 03/16/2021, SpO2 100%. Body mass index is 41.6 kg/m.  Lab Results  Component Value Date   CREATININE 0.81 06/26/2022   BUN 14 06/26/2022   NA 139 06/26/2022   K 4.3 06/26/2022   CL 103 06/26/2022   CO2 23 06/26/2022   Lab Results  Component Value Date   ALT 13 06/26/2022   AST 14 06/26/2022   ALKPHOS 51 06/26/2022   BILITOT 0.3 06/26/2022   Lab Results  Component Value Date   HGBA1C 5.3 06/26/2022   HGBA1C 5.6 11/23/2021   HGBA1C 5.5 05/29/2021   HGBA1C 6.4 (H) 07/28/2020   HGBA1C 6.4 (H) 03/15/2020   Lab Results  Component Value Date   INSULIN 4.0 06/26/2022   INSULIN 11.0 11/23/2021   INSULIN  13.1 05/29/2021   INSULIN 15.0 07/28/2020   INSULIN 30.0 (H) 03/15/2020   Lab Results  Component Value Date   TSH 1.820 06/26/2022   Lab Results  Component Value Date   CHOL 184 06/26/2022   HDL 85 06/26/2022   LDLCALC 86 06/26/2022   TRIG 67 06/26/2022   CHOLHDL 3.3 11/23/2021   Lab Results  Component Value Date   VD25OH 50.1 06/26/2022   VD25OH 95.2 11/23/2021   VD25OH 78.1 05/29/2021   Lab Results  Component Value Date   WBC 4.2 06/26/2022   HGB 12.7 06/26/2022   HCT 38.6 06/26/2022   MCV 94 06/26/2022   PLT 202 06/26/2022   Lab Results  Component  Value Date   IRON 63 07/28/2020   TIBC 274 07/28/2020   FERRITIN 57 07/28/2020   Attestation Statements:   Reviewed by clinician on day of visit: allergies, medications, problem list, medical history, surgical history, family history, social history, and previous encounter notes.   I, Burt Knack, am acting as transcriptionist for Quillian Quince, MD.  I have reviewed the above documentation for accuracy and completeness, and I agree with the above. -  Quillian Quince, MD

## 2022-12-26 ENCOUNTER — Encounter (INDEPENDENT_AMBULATORY_CARE_PROVIDER_SITE_OTHER): Payer: Self-pay | Admitting: Family Medicine

## 2022-12-26 ENCOUNTER — Ambulatory Visit (INDEPENDENT_AMBULATORY_CARE_PROVIDER_SITE_OTHER): Payer: BC Managed Care – PPO | Admitting: Family Medicine

## 2022-12-26 ENCOUNTER — Other Ambulatory Visit (HOSPITAL_BASED_OUTPATIENT_CLINIC_OR_DEPARTMENT_OTHER): Payer: Self-pay

## 2022-12-26 VITALS — BP 104/71 | HR 69 | Temp 98.1°F | Ht 65.0 in | Wt 249.0 lb

## 2022-12-26 DIAGNOSIS — K5901 Slow transit constipation: Secondary | ICD-10-CM

## 2022-12-26 DIAGNOSIS — F3289 Other specified depressive episodes: Secondary | ICD-10-CM

## 2022-12-26 DIAGNOSIS — K5900 Constipation, unspecified: Secondary | ICD-10-CM | POA: Diagnosis not present

## 2022-12-26 DIAGNOSIS — F5089 Other specified eating disorder: Secondary | ICD-10-CM | POA: Diagnosis not present

## 2022-12-26 DIAGNOSIS — R7303 Prediabetes: Secondary | ICD-10-CM | POA: Diagnosis not present

## 2022-12-26 DIAGNOSIS — Z6841 Body Mass Index (BMI) 40.0 and over, adult: Secondary | ICD-10-CM

## 2022-12-26 MED ORDER — BUPROPION HCL ER (SR) 200 MG PO TB12
200.0000 mg | ORAL_TABLET | Freq: Every morning | ORAL | 0 refills | Status: DC
  Filled 2022-12-26 – 2023-01-17 (×2): qty 30, 30d supply, fill #0

## 2022-12-26 MED ORDER — METFORMIN HCL 500 MG PO TABS
500.0000 mg | ORAL_TABLET | Freq: Every day | ORAL | 0 refills | Status: DC
  Filled 2022-12-26 – 2023-01-17 (×2): qty 30, 30d supply, fill #0

## 2022-12-26 MED ORDER — TOPIRAMATE 50 MG PO TABS
50.0000 mg | ORAL_TABLET | Freq: Two times a day (BID) | ORAL | 0 refills | Status: DC
  Filled 2022-12-26 – 2023-01-17 (×2): qty 60, 30d supply, fill #0

## 2022-12-26 NOTE — Progress Notes (Signed)
.[facility]  Office: [phone]  /  Fax: [phone]  WEIGHT SUMMARY AND BIOMETRICS  Anthropometric Measurements Height: 5\' 5"  (1.651 m) Weight: 249 lb (112.9 kg) BMI (Calculated): 41.44 Weight at Last Visit: 250 lb Weight Lost Since Last Visit: 1 lb Weight Gained Since Last Visit: 0 Starting Weight: 333 lb Total Weight Loss (lbs): 84 lb (38.1 kg)   Body Composition  Body Fat %: 47.2 % Fat Mass (lbs): 117.6 lbs Muscle Mass (lbs): 124.8 lbs Total Body Water (lbs): 103.4 lbs Visceral Fat Rating : 15   Other Clinical Data Fasting: no Labs: no Today's Visit #: 41 Starting Date: 03/15/20    Chief Complaint: OBESITY    History of Present Illness   The patient, with a history of prediabetes and depression associated with emotional eating behaviors, presents for a routine follow-up. She is currently on metformin for prediabetes and topiramate and Wellbutrin for emotional eating behaviors. She reports adherence to a category two eating plan approximately 75% of the time and has lost a total of 84 pounds since starting the program. However, she is not currently engaging in any exercise regimen.  The patient reports having a "bad day" and admits to potentially consuming a glass of vodka as a coping mechanism, a change from previous unhealthy behaviors. She also mentions working on [employer] as a form of distraction. She is currently reading a book about children recovering from narcissistic fathers, which she describes as a "heavy" read.  The patient reports that the medication combination is working well, but she struggles to remember to take the midday dose. When she does remember, she notices a significant improvement in her symptoms. Her spouse has also noticed a decrease in her binge eating behaviors. The patient reports that the second dose of medication, taken around 1-2 PM, has been effective.  The patient has noticed a slowing of her bowel movements since starting the  medication, which she manages with Miralax in the morning. She reports no pain or discomfort associated with this change. She also reports improved hydration habits.  The patient is preparing for the upcoming Thanksgiving holiday and has made plans to ensure a healthier meal, including adding more vegetables to the menu and preparing smaller portions of starchy foods. She expresses a commitment to maintaining her progress during the holiday season.          PHYSICAL EXAM:  Blood pressure 104/71, pulse 69, temperature 98.1 F (36.7 C), height 5\' 5"  (1.651 m), weight 249 lb (112.9 kg), last menstrual period 03/16/2021, SpO2 97%. Body mass index is 41.44 kg/m.  DIAGNOSTIC DATA REVIEWED:  BMET    Component Value Date/Time   NA 139 06/26/2022 0734   K 4.3 06/26/2022 0734   CL 103 06/26/2022 0734   CO2 23 06/26/2022 0734   GLUCOSE 103 (H) 06/26/2022 0734   GLUCOSE 82 04/10/2021 1127   BUN 14 06/26/2022 0734   CREATININE 0.81 06/26/2022 0734   CALCIUM 9.0 06/26/2022 0734   GFRNONAA >60 04/10/2021 1127   GFRAA 80 03/15/2020 1033   Lab Results  Component Value Date   HGBA1C 5.3 06/26/2022   HGBA1C 6.4 (H) 03/15/2020   Lab Results  Component Value Date   INSULIN 4.0 06/26/2022   INSULIN 30.0 (H) 03/15/2020   Lab Results  Component Value Date   TSH 1.820 06/26/2022   CBC    Component Value Date/Time   WBC 4.2 06/26/2022 0734   WBC 4.7 04/10/2021 1127   RBC 4.12 06/26/2022 0734  RBC 4.62 04/10/2021 1127   HGB 12.7 06/26/2022 0734   HCT 38.6 06/26/2022 0734   PLT 202 06/26/2022 0734   MCV 94 06/26/2022 0734   MCH 30.8 06/26/2022 0734   MCH 30.5 04/10/2021 1127   MCHC 32.9 06/26/2022 0734   MCHC 34.6 04/10/2021 1127   RDW 13.0 06/26/2022 0734   Iron Studies    Component Value Date/Time   IRON 63 07/28/2020 0920   TIBC 274 07/28/2020 0920   FERRITIN 57 07/28/2020 0920   IRONPCTSAT 23 07/28/2020 0920   Lipid Panel     Component Value Date/Time   CHOL 184  06/26/2022 0734   TRIG 67 06/26/2022 0734   HDL 85 06/26/2022 0734   CHOLHDL 3.3 11/23/2021 1236   LDLCALC 86 06/26/2022 0734   Hepatic Function Panel     Component Value Date/Time   PROT 6.3 06/26/2022 0734   ALBUMIN 3.9 06/26/2022 0734   AST 14 06/26/2022 0734   ALT 13 06/26/2022 0734   ALKPHOS 51 06/26/2022 0734   BILITOT 0.3 06/26/2022 0734      Component Value Date/Time   TSH 1.820 06/26/2022 0734   Nutritional Lab Results  Component Value Date   VD25OH 50.1 06/26/2022   VD25OH 95.2 11/23/2021   VD25OH 78.1 05/29/2021     Assessment and Plan    Emotional Eating Behaviors and Constipation Emotional eating behaviors managed with topiramate and Wellbutrin. Reports medication combination is effective, particularly with midday dose. Noted improvement in binge eating behaviors as observed by spouse. Reports constipation likely due to medications and dietary changes, managed with Miralax. Discussed potential gastrointestinal side effects of topiramate and Wellbutrin, including constipation, and benefits of Miralax. Emphasized importance of hydration and regular bowel movements. - Refill topiramate - Refill Wellbutrin - Continue Miralax as needed for constipation - Encourage hydration - Discuss Thanksgiving eating strategy to manage emotional eating -Stress reduction strategies discussed  Prediabetes and Obesity Prediabetes and Obesity managed with metformin, diet, and exercise. Reports adherence to category two eating plan approximately 75% of the time. No current exercise regimen but has lost another pound over the last month, totaling 84 pounds lost since starting the program. Discussed importance of continuing diet and exercise to manage glucose levels and prevent progression to diabetes. - Refill metformin - Continue diet and exercise regimen, Category 2 plan  General Health Maintenance Discussed importance of maintaining a balanced diet and incorporating more  vegetables. Emphasized benefits of portion control and mindful eating during holiday meals to prevent overeating. - Schedule next appointment before Christmas on December 18th at 4 PM - Schedule January appointment.       She was informed of the importance of frequent follow up visits to maximize her success with intensive lifestyle modifications for her multiple health conditions.    Quillian Quince, MD

## 2023-01-16 ENCOUNTER — Ambulatory Visit: Payer: BC Managed Care – PPO | Admitting: Family Medicine

## 2023-01-16 ENCOUNTER — Other Ambulatory Visit (HOSPITAL_BASED_OUTPATIENT_CLINIC_OR_DEPARTMENT_OTHER): Payer: Self-pay

## 2023-01-16 ENCOUNTER — Encounter: Payer: Self-pay | Admitting: Family Medicine

## 2023-01-16 VITALS — BP 127/84 | HR 58 | Temp 98.7°F | Resp 16 | Ht 65.52 in | Wt 260.6 lb

## 2023-01-16 DIAGNOSIS — Z0000 Encounter for general adult medical examination without abnormal findings: Secondary | ICD-10-CM | POA: Diagnosis not present

## 2023-01-16 DIAGNOSIS — Z23 Encounter for immunization: Secondary | ICD-10-CM

## 2023-01-16 DIAGNOSIS — Z1159 Encounter for screening for other viral diseases: Secondary | ICD-10-CM

## 2023-01-16 DIAGNOSIS — Z13220 Encounter for screening for lipoid disorders: Secondary | ICD-10-CM

## 2023-01-16 DIAGNOSIS — Z130 Encounter for screening for diseases of the blood and blood-forming organs and certain disorders involving the immune mechanism: Secondary | ICD-10-CM

## 2023-01-16 DIAGNOSIS — Z114 Encounter for screening for human immunodeficiency virus [HIV]: Secondary | ICD-10-CM

## 2023-01-16 MED ORDER — MELOXICAM 15 MG PO TABS
15.0000 mg | ORAL_TABLET | Freq: Every day | ORAL | 1 refills | Status: DC
  Filled 2023-01-16 – 2023-03-09 (×3): qty 90, 90d supply, fill #0
  Filled 2023-06-10: qty 90, 90d supply, fill #1

## 2023-01-17 ENCOUNTER — Other Ambulatory Visit: Payer: Self-pay | Admitting: Family Medicine

## 2023-01-17 LAB — LIPID PANEL
Chol/HDL Ratio: 2.4 {ratio} (ref 0.0–4.4)
Cholesterol, Total: 210 mg/dL — ABNORMAL HIGH (ref 100–199)
HDL: 87 mg/dL (ref >39)
LDL Chol Calc (NIH): 110 mg/dL — ABNORMAL HIGH (ref 0–99)
Triglycerides: 73 mg/dL (ref 0–149)
VLDL Cholesterol Cal: 13 mg/dL (ref 5–40)

## 2023-01-17 LAB — CBC WITH DIFFERENTIAL/PLATELET
Basophils Absolute: 0 10*3/uL (ref 0.0–0.2)
Basos: 1 %
EOS (ABSOLUTE): 0.1 10*3/uL (ref 0.0–0.4)
Eos: 4 %
Hematocrit: 43.8 % (ref 34.0–46.6)
Hemoglobin: 14.5 g/dL (ref 11.1–15.9)
Immature Grans (Abs): 0 10*3/uL (ref 0.0–0.1)
Immature Granulocytes: 1 %
Lymphocytes Absolute: 1.7 10*3/uL (ref 0.7–3.1)
Lymphs: 45 %
MCH: 31.3 pg (ref 26.6–33.0)
MCHC: 33.1 g/dL (ref 31.5–35.7)
MCV: 94 fL (ref 79–97)
Monocytes Absolute: 0.4 10*3/uL (ref 0.1–0.9)
Monocytes: 9 %
Neutrophils Absolute: 1.5 10*3/uL (ref 1.4–7.0)
Neutrophils: 40 %
Platelets: 283 10*3/uL (ref 150–450)
RBC: 4.64 x10E6/uL (ref 3.77–5.28)
RDW: 12.6 % (ref 11.7–15.4)
WBC: 3.8 10*3/uL (ref 3.4–10.8)

## 2023-01-17 LAB — CMP14+EGFR
ALT: 11 [IU]/L (ref 0–32)
AST: 14 [IU]/L (ref 0–40)
Albumin: 4.1 g/dL (ref 3.8–4.9)
Alkaline Phosphatase: 68 [IU]/L (ref 44–121)
BUN/Creatinine Ratio: 15 (ref 9–23)
BUN: 15 mg/dL (ref 6–24)
Bilirubin Total: 0.2 mg/dL (ref 0.0–1.2)
CO2: 22 mmol/L (ref 20–29)
Calcium: 9.1 mg/dL (ref 8.7–10.2)
Chloride: 102 mmol/L (ref 96–106)
Creatinine, Ser: 0.97 mg/dL (ref 0.57–1.00)
Globulin, Total: 2.8 g/dL (ref 1.5–4.5)
Glucose: 90 mg/dL (ref 70–99)
Potassium: 4 mmol/L (ref 3.5–5.2)
Sodium: 136 mmol/L (ref 134–144)
Total Protein: 6.9 g/dL (ref 6.0–8.5)
eGFR: 70 mL/min/{1.73_m2} (ref >59)

## 2023-01-17 LAB — VITAMIN D 25 HYDROXY (VIT D DEFICIENCY, FRACTURES): Vit D, 25-Hydroxy: 55.9 ng/mL (ref 30.0–100.0)

## 2023-01-17 LAB — HEPATITIS C ANTIBODY: Hep C Virus Ab: NONREACTIVE

## 2023-01-17 LAB — TSH: TSH: 1.39 u[IU]/mL (ref 0.450–4.500)

## 2023-01-17 LAB — HEMOGLOBIN A1C
Est. average glucose Bld gHb Est-mCnc: 117 mg/dL
Hgb A1c MFr Bld: 5.7 % — ABNORMAL HIGH (ref 4.8–5.6)

## 2023-01-17 LAB — HIV ANTIBODY (ROUTINE TESTING W REFLEX): HIV Screen 4th Generation wRfx: NONREACTIVE

## 2023-01-18 ENCOUNTER — Other Ambulatory Visit: Payer: Self-pay

## 2023-01-18 ENCOUNTER — Other Ambulatory Visit (HOSPITAL_BASED_OUTPATIENT_CLINIC_OR_DEPARTMENT_OTHER): Payer: Self-pay

## 2023-01-18 ENCOUNTER — Encounter: Payer: Self-pay | Admitting: Family Medicine

## 2023-01-18 MED ORDER — LOSARTAN POTASSIUM-HCTZ 100-25 MG PO TABS
1.0000 | ORAL_TABLET | Freq: Every day | ORAL | 1 refills | Status: DC
  Filled 2023-01-18 – 2023-03-09 (×3): qty 90, 90d supply, fill #0
  Filled 2023-06-10: qty 90, 90d supply, fill #1

## 2023-01-18 MED ORDER — PANTOPRAZOLE SODIUM 40 MG PO TBEC
40.0000 mg | DELAYED_RELEASE_TABLET | Freq: Every day | ORAL | 3 refills | Status: AC
  Filled 2023-01-18 – 2023-03-09 (×3): qty 90, 90d supply, fill #0
  Filled 2023-06-10: qty 90, 90d supply, fill #1
  Filled 2023-10-24: qty 90, 90d supply, fill #2

## 2023-01-18 NOTE — Progress Notes (Signed)
Established Patient Office Visit  Subjective    Patient ID: Pamela [name], female    DOB: [birth year]  Age: 52 y.o. MRN: [id number]  CC:  Chief Complaint  Patient presents with   New Patient (Initial Visit)   Annual Exam    Pain in back    HPI Pamela N Geerdes presents for routine annual exam. Patient denies acute complaints or concerns.   Outpatient Encounter Medications as of 01/16/2023  Medication Sig   albuterol (VENTOLIN HFA) 108 (90 Base) MCG/ACT inhaler Inhale 2 puffs into the lungs every 6 (six) hours as needed for wheezing or shortness of breath.   budesonide (PULMICORT FLEXHALER) 180 MCG/ACT inhaler With respiratory illness or flare up, start 1 puffs twice daily for 2 weeks.   buPROPion (WELLBUTRIN SR) 200 MG 12 hr tablet Take 1 tablet (200 mg total) by mouth every morning.   cetirizine (ZYRTEC) 10 MG tablet Take 1 tablet (10 mg total) by mouth daily.   fluticasone (FLONASE) 50 MCG/ACT nasal spray Place 2 sprays into both nostrils daily as needed for allergies or rhinitis.   metFORMIN (GLUCOPHAGE) 500 MG tablet Take 1 tablet (500 mg total) by mouth daily.   Misc Natural Products (DAILY HERBS IMMUNE DEFENSE PO) Take by mouth.   montelukast (SINGULAIR) 10 MG tablet Take 1 tablet (10 mg total) by mouth at bedtime.   nitrofurantoin, macrocrystal-monohydrate, (MACROBID) 100 MG capsule Take 100 mg by mouth 2 (two) times daily.   [DISCONTINUED] losartan-hydrochlorothiazide (HYZAAR) 100-25 MG tablet Take 1 tablet by mouth daily.   [DISCONTINUED] meloxicam (MOBIC) 15 MG tablet Take 1 tablet (15 mg total) by mouth daily with meals   [DISCONTINUED] pantoprazole (PROTONIX) 40 MG tablet Take 1 tablet (40 mg total) by mouth daily.   doxycycline (ADOXA) 100 MG tablet Take 1 tablet by mouth twice a day (Patient not taking: Reported on 12/26/2022)   meloxicam (MOBIC) 15 MG tablet Take 1 tablet (15 mg total) by mouth daily with meals   predniSONE (DELTASONE) 50 MG tablet Take 1 tablet  (50 mg total) by mouth daily with breakfast. (Patient not taking: Reported on 12/26/2022)   topiramate (TOPAMAX) 50 MG tablet Take 1 tablet (50 mg total) by mouth 2 (two) times daily.   No facility-administered encounter medications on file as of 01/16/2023.    Past Medical History:  Diagnosis Date   ADHD    Anxiety    Asthma    Patient follows with [facility] [place]. LOV 03/24/21 with FNP Thermon Leyland.As of 04/03/21, pt is using her inhaler 1 -2 times per day per pt.   Back pain    Constipation    COVID 03/09/2021   Patient took antiviral. As of 04/03/21, pt states that all symptoms are resolved.   GERD (gastroesophageal reflux disease)    Hypertension    IBS (irritable bowel syndrome)    Joint pain    Lower extremity edema    Obesity    Pre-diabetes    07/28/20 A1C 6.4 / Patient is taking NWGNFA.   Sleep apnea    05/03/20 sleep study = moderate to severe sleep apnea. Wears CPAP.   Wears glasses     Past Surgical History:  Procedure Laterality Date   CESAREAN SECTION  1998   COLONOSCOPY  08/19/2020   OVARIAN CYST REMOVAL  1991   ROBOTIC ASSISTED LAPAROSCOPIC HYSTERECTOMY AND SALPINGECTOMY Bilateral 04/12/2021   Procedure: XI ROBOTIC ASSISTED LAPAROSCOPIC HYSTERECTOMY AND BILATERAL SALPINGECTOMY;  Surgeon: Steva Ready, DO;  Location: [facility];  Service: Gynecology;  Laterality: Bilateral;   TUBAL LIGATION  2008   WISDOM TOOTH EXTRACTION  1986    Family History  Problem Relation Age of Onset   Asthma Other    Heart failure Other    Hypertension Other    Hypertension Mother    Hypertension Father    Heart disease Father    Alcoholism Father     Social History   Socioeconomic History   Marital status: Married    Spouse name: Not on file   Number of children: Not on file   Years of education: Not on file   Highest education level: Not on file  Occupational History   Occupation: Educator   Tobacco Use   Smoking status: Never     Passive exposure: Never   Smokeless tobacco: Never  Vaping Use   Vaping status: Never Used  Substance and Sexual Activity   Alcohol use: Yes    Comment: occasionally, once per month   Drug use: No   Sexual activity: Yes    Birth control/protection: Surgical    Comment: tubal ligation  Other Topics Concern   Not on file  Social History Narrative   Lives at home with husband   Right handed   Caffeine: 1 cup/day   Social Drivers of Corporate investment banker Strain: Low Risk  (01/16/2023)   Overall Financial Resource Strain (CARDIA)    Difficulty of Paying Living Expenses: Not hard at all  Food Insecurity: No Food Insecurity (01/16/2023)   Hunger Vital Sign    Worried About Running Out of Food in the Last Year: Never true    Ran Out of Food in the Last Year: Never true  Transportation Needs: No Transportation Needs (01/16/2023)   PRAPARE - Administrator, Civil Service (Medical): No    Lack of Transportation (Non-Medical): No  Physical Activity: Insufficiently Active (01/16/2023)   Exercise Vital Sign    Days of Exercise per Week: 1 day    Minutes of Exercise per Session: 30 min  Stress: No Stress Concern Present (01/16/2023)   [employer] of Occupational Health - Occupational Stress Questionnaire    Feeling of Stress : Not at all  Social Connections: Socially Integrated (01/16/2023)   Social Connection and Isolation Panel [NHANES]    Frequency of Communication with Friends and Family: More than three times a week    Frequency of Social Gatherings with Friends and Family: More than three times a week    Attends Religious Services: More than 4 times per year    Active Member of [employer] or Organizations: Yes    Attends Banker Meetings: More than 4 times per year    Marital Status: Married  Catering manager Violence: Not At Risk (01/16/2023)   Humiliation, Afraid, Rape, and Kick questionnaire    Fear of Current or Ex-Partner: No     Emotionally Abused: No    Physically Abused: No    Sexually Abused: No    Review of Systems  All other systems reviewed and are negative.       Objective    BP 127/84 (BP Location: Right Arm, Patient Position: Sitting, Cuff Size: Large)   Pulse (!) 58   Temp 98.7 F (37.1 C) (Oral)   Resp 16   Ht 5' 5.52" (1.664 m)   Wt 260 lb 9.6 oz (118.2 kg)   LMP 03/16/2021 (Exact Date)   SpO2 98%   BMI  42.68 kg/m   Physical Exam Vitals and nursing note reviewed.  Constitutional:      General: She is not in acute distress.    Appearance: She is obese.  HENT:     Head: Normocephalic and atraumatic.     Right Ear: Tympanic membrane, ear canal and external ear normal.     Left Ear: Tympanic membrane, ear canal and external ear normal.     Nose: Nose normal.     Mouth/Throat:     Mouth: Mucous membranes are moist.     Pharynx: Oropharynx is clear.  Eyes:     Conjunctiva/sclera: Conjunctivae normal.     Pupils: Pupils are equal, round, and reactive to light.  Neck:     Thyroid: No thyromegaly.  Cardiovascular:     Rate and Rhythm: Normal rate and regular rhythm.     Heart sounds: Normal heart sounds. No murmur heard. Pulmonary:     Effort: Pulmonary effort is normal. No respiratory distress.     Breath sounds: Normal breath sounds.  Abdominal:     General: There is no distension.     Palpations: Abdomen is soft. There is no mass.     Tenderness: There is no abdominal tenderness.  Musculoskeletal:        General: Normal range of motion.     Cervical back: Normal range of motion and neck supple.  Skin:    General: Skin is warm and dry.  Neurological:     General: No focal deficit present.     Mental Status: She is alert and oriented to person, place, and time.  Psychiatric:        Mood and Affect: Mood normal.        Behavior: Behavior normal.         Assessment & Plan:   Annual physical exam -     CMP14+EGFR  Screening for deficiency anemia -     CBC with  Differential/Platelet  Screening for lipid disorders -     Lipid panel  Screening for endocrine/metabolic/immunity disorders -     VITAMIN D 25 Hydroxy (Vit-D Deficiency, Fractures) -     Hemoglobin A1c -     TSH  Screening for HIV (human immunodeficiency virus) -     HIV Antibody (routine testing w rflx)  Need for hepatitis C screening test -     Hepatitis C antibody  Need for vaccination -     Tdap vaccine greater than or equal to 7yo IM  Other orders -     Meloxicam; Take 1 tablet (15 mg total) by mouth daily with meals  Dispense: 90 tablet; Refill: 1     Return in about 6 months (around 07/17/2023) for follow up.   Tommie Raymond, MD

## 2023-01-18 NOTE — Telephone Encounter (Signed)
Requested Prescriptions  Pending Prescriptions Disp Refills   losartan-hydrochlorothiazide (HYZAAR) 100-25 MG tablet 90 tablet 1    Sig: Take 1 tablet by mouth daily.     Cardiovascular: ARB + Diuretic Combos Passed - 01/18/2023  7:41 AM      Passed - K in normal range and within 180 days    Potassium  Date Value Ref Range Status  01/16/2023 4.0 3.5 - 5.2 mmol/L Final         Passed - Na in normal range and within 180 days    Sodium  Date Value Ref Range Status  01/16/2023 136 134 - 144 mmol/L Final         Passed - Cr in normal range and within 180 days    Creatinine, Ser  Date Value Ref Range Status  01/16/2023 0.97 0.57 - 1.00 mg/dL Final         Passed - eGFR is 10 or above and within 180 days    GFR calc Af Amer  Date Value Ref Range Status  03/15/2020 80 >59 mL/min/1.73 Final    Comment:    **In accordance with recommendations from the NKF-ASN Task force,**   [facility] is in the process of updating its eGFR calculation to the   2021 CKD-EPI creatinine equation that estimates kidney function   without a race variable.    GFR, Estimated  Date Value Ref Range Status  04/10/2021 >60 >60 mL/min Final    Comment:    (NOTE) Calculated using the CKD-EPI Creatinine Equation (2021)    eGFR  Date Value Ref Range Status  01/16/2023 70 >59 mL/min/1.73 Final         Passed - Patient is not pregnant      Passed - Last BP in normal range    BP Readings from Last 1 Encounters:  01/16/23 127/84         Passed - Valid encounter within last 6 months    Recent Outpatient Visits           2 days ago Annual physical exam   [facility], MD   10 months ago Annual physical exam   [facility], MD   1 year ago Mild persistent asthma with acute exacerbation   [facility], MD   1 year ago Bilateral otitis media, unspecified otitis media type    [facility] [place], NP   1 year ago Essential hypertension   [facility]is Poag, MD               pantoprazole (PROTONIX) 40 MG tablet 90 tablet 3    Sig: Take 1 tablet (40 mg total) by mouth daily.     Gastroenterology: Proton Pump Inhibitors Passed - 01/18/2023  7:41 AM      Passed - Valid encounter within last 12 months    Recent Outpatient Visits           2 days ago Annual physical exam   [facility], MD   10 months ago Annual physical exam   [facility], MD   1 year ago Mild persistent asthma with acute exacerbation   [facility], MD   1 year ago  Bilateral otitis media, unspecified otitis media type   [facility] [place], NP   1 year ago Essential hypertension   [facility], MD

## 2023-01-23 ENCOUNTER — Ambulatory Visit (INDEPENDENT_AMBULATORY_CARE_PROVIDER_SITE_OTHER): Payer: BC Managed Care – PPO | Admitting: Family Medicine

## 2023-01-29 ENCOUNTER — Other Ambulatory Visit (HOSPITAL_BASED_OUTPATIENT_CLINIC_OR_DEPARTMENT_OTHER): Payer: Self-pay

## 2023-03-06 ENCOUNTER — Ambulatory Visit (INDEPENDENT_AMBULATORY_CARE_PROVIDER_SITE_OTHER): Payer: 59 | Admitting: Family Medicine

## 2023-03-06 ENCOUNTER — Encounter (INDEPENDENT_AMBULATORY_CARE_PROVIDER_SITE_OTHER): Payer: Self-pay | Admitting: Family Medicine

## 2023-03-06 VITALS — BP 148/84 | HR 52 | Temp 97.9°F | Ht 65.0 in | Wt 267.0 lb

## 2023-03-06 DIAGNOSIS — E669 Obesity, unspecified: Secondary | ICD-10-CM | POA: Diagnosis not present

## 2023-03-06 DIAGNOSIS — R7303 Prediabetes: Secondary | ICD-10-CM

## 2023-03-06 DIAGNOSIS — F5089 Other specified eating disorder: Secondary | ICD-10-CM

## 2023-03-06 DIAGNOSIS — Z6841 Body Mass Index (BMI) 40.0 and over, adult: Secondary | ICD-10-CM

## 2023-03-06 DIAGNOSIS — I10 Essential (primary) hypertension: Secondary | ICD-10-CM

## 2023-03-06 DIAGNOSIS — F3289 Other specified depressive episodes: Secondary | ICD-10-CM

## 2023-03-06 MED ORDER — TOPIRAMATE 50 MG PO TABS
50.0000 mg | ORAL_TABLET | Freq: Two times a day (BID) | ORAL | 0 refills | Status: DC
  Filled 2023-03-06: qty 60, 30d supply, fill #0

## 2023-03-06 MED ORDER — BUPROPION HCL ER (SR) 200 MG PO TB12
200.0000 mg | ORAL_TABLET | Freq: Every morning | ORAL | 0 refills | Status: DC
  Filled 2023-03-06: qty 30, 30d supply, fill #0

## 2023-03-06 MED ORDER — METFORMIN HCL 500 MG PO TABS
500.0000 mg | ORAL_TABLET | Freq: Two times a day (BID) | ORAL | 0 refills | Status: DC
  Filled 2023-03-06: qty 60, 30d supply, fill #0

## 2023-03-06 NOTE — Progress Notes (Signed)
.smr  Office: [phone]  /  Fax: [phone]  WEIGHT SUMMARY AND BIOMETRICS  Anthropometric Measurements Height: 5\' 5"  (1.651 m) Weight: 267 lb (121.1 kg) BMI (Calculated): 44.43 Weight at Last Visit: 249 lb Weight Lost Since Last Visit: 0 Weight Gained Since Last Visit: 18 lb Starting Weight: 333 lb Total Weight Loss (lbs): 66 lb (29.9 kg)   Body Composition  Body Fat %: 53.1 % Fat Mass (lbs): 142 lbs Muscle Mass (lbs): 119.2 lbs Total Body Water (lbs): 98.4 lbs Visceral Fat Rating : 18   Other Clinical Data Fasting: No Labs: No Today's Visit #: 42 Starting Date: 03/15/20    Chief Complaint: OBESITY    History of Present Illness   The patient presents with prediabetes, emotional eating behaviors, hypertension, and obesity.  She has prediabetes with a recent hemoglobin A1c of 5.7%. She is on metformin and has been working on diet and exercise but has been struggling recently. She has gained 18 pounds over the past two months, coinciding with the holiday season, and has not been following her eating plan or exercising regularly. She has just started walking again for exercise.  Her blood pressure readings today are elevated at 148/84 mmHg and 149/85 mmHg. She is currently taking losartan for hypertension, which has been better controlled until today.  She is on Wellbutrin and topiramate for emotional eating behavior and requests refills for these medications. She describes a 'season of giving up' during the holidays, acknowledging that she stopped trying to manage her eating and exercise. She reports experiencing hunger pains despite consuming adequate meals, which she attributes to her recent indulgences and the resulting cycle of increased hunger.          PHYSICAL EXAM:  Blood pressure (!) 148/84, pulse (!) 52, temperature 97.9 F (36.6 C), height 5\' 5"  (1.651 m), weight 267 lb (121.1 kg), last menstrual period 03/16/2021, SpO2 100%. Body mass index is 44.43  kg/m.  DIAGNOSTIC DATA REVIEWED:  BMET    Component Value Date/Time   NA 136 01/16/2023 0851   K 4.0 01/16/2023 0851   CL 102 01/16/2023 0851   CO2 22 01/16/2023 0851   GLUCOSE 90 01/16/2023 0851   GLUCOSE 82 04/10/2021 1127   BUN 15 01/16/2023 0851   CREATININE 0.97 01/16/2023 0851   CALCIUM 9.1 01/16/2023 0851   GFRNONAA >60 04/10/2021 1127   GFRAA 80 03/15/2020 1033   Lab Results  Component Value Date   HGBA1C 5.7 (H) 01/16/2023   HGBA1C 6.4 (H) 03/15/2020   Lab Results  Component Value Date   INSULIN 4.0 06/26/2022   INSULIN 30.0 (H) 03/15/2020   Lab Results  Component Value Date   TSH 1.390 01/16/2023   CBC    Component Value Date/Time   WBC 3.8 01/16/2023 0851   WBC 4.7 04/10/2021 1127   RBC 4.64 01/16/2023 0851   RBC 4.62 04/10/2021 1127   HGB 14.5 01/16/2023 0851   HCT 43.8 01/16/2023 0851   PLT 283 01/16/2023 0851   MCV 94 01/16/2023 0851   MCH 31.3 01/16/2023 0851   MCH 30.5 04/10/2021 1127   MCHC 33.1 01/16/2023 0851   MCHC 34.6 04/10/2021 1127   RDW 12.6 01/16/2023 0851   Iron Studies    Component Value Date/Time   IRON 63 07/28/2020 0920   TIBC 274 07/28/2020 0920   FERRITIN 57 07/28/2020 0920   IRONPCTSAT 23 07/28/2020 0920   Lipid Panel     Component Value Date/Time   CHOL 210 (H) 01/16/2023  0851   TRIG 73 01/16/2023 0851   HDL 87 01/16/2023 0851   CHOLHDL 2.4 01/16/2023 0851   LDLCALC 110 (H) 01/16/2023 0851   Hepatic Function Panel     Component Value Date/Time   PROT 6.9 01/16/2023 0851   ALBUMIN 4.1 01/16/2023 0851   AST 14 01/16/2023 0851   ALT 11 01/16/2023 0851   ALKPHOS 68 01/16/2023 0851   BILITOT 0.2 01/16/2023 0851      Component Value Date/Time   TSH 1.390 01/16/2023 0851   Nutritional Lab Results  Component Value Date   VD25OH 55.9 01/16/2023   VD25OH 50.1 06/26/2022   VD25OH 95.2 11/23/2021     Assessment and Plan    Prediabetes   Prediabetes with recent hemoglobin A1c of 5.7%. Struggling with  diet and exercise, leading to weight gain and increased hunger. Discussed insulin resistance, its genetic prevalence, and its impact on hunger and fat storage. GLP-1 medications were discussed for her cardiovascular and renal benefits, but are not viable due to cost and insurance issues. Plan to maximize metformin dosage before considering alternatives.   - Increase metformin to 500 mg twice daily   - Take the second dose of metformin at lunch or dinner   - Initiate a low-carb diet for 2-4 weeks to reduce insulin response and hunger    Obesity   Obesity with an 18-pound weight gain over the past two months, coinciding with the holiday season and a lapse in diet and exercise regimen. Resumed walking for exercise.   - Continue walking for exercise   - Follow low-carb diet plan as discussed    Emotional Eating   Emotional eating managed with Wellbutrin and topiramate. Reports hunger pains despite adequate food intake, likely due to insulin resistance and recent dietary indulgences.   - Refill Wellbutrin and topiramate prescriptions    Hypertension   Hypertension with current blood pressure readings of 148/84 mmHg and 149/85 mmHg. On losartan but has had better control in the past. Elevated readings may be related to recent weight gain and dietary changes.   - Recheck blood pressure next month    Follow-up   - Follow up in February to reassess blood pressure and weight management progress.        She was informed of the importance of frequent follow up visits to maximize her success with intensive lifestyle modifications for her multiple health conditions.    Quillian Quince, MD

## 2023-03-07 ENCOUNTER — Other Ambulatory Visit (HOSPITAL_BASED_OUTPATIENT_CLINIC_OR_DEPARTMENT_OTHER): Payer: Self-pay

## 2023-03-08 ENCOUNTER — Other Ambulatory Visit (HOSPITAL_BASED_OUTPATIENT_CLINIC_OR_DEPARTMENT_OTHER): Payer: Self-pay

## 2023-03-08 ENCOUNTER — Other Ambulatory Visit: Payer: Self-pay

## 2023-03-08 ENCOUNTER — Other Ambulatory Visit: Payer: Self-pay | Admitting: Internal Medicine

## 2023-03-08 MED ORDER — MONTELUKAST SODIUM 10 MG PO TABS
10.0000 mg | ORAL_TABLET | Freq: Every day | ORAL | 5 refills | Status: AC
  Filled 2023-03-08: qty 30, 30d supply, fill #0
  Filled 2023-06-10: qty 30, 30d supply, fill #1

## 2023-03-09 ENCOUNTER — Other Ambulatory Visit (HOSPITAL_BASED_OUTPATIENT_CLINIC_OR_DEPARTMENT_OTHER): Payer: Self-pay

## 2023-03-19 NOTE — Telephone Encounter (Signed)
Copied from CRM [id number]. Topic: Clinical - Medical Advice >> Mar 19, 2023  3:41 PM Yolanda T wrote: Reason for CRM: patient called stated she is experiencing runny nose, cough, low grade fever scratchy sore throat, fatigue and headaches. She tested positive for covid and request a script for Paxlovid.

## 2023-04-04 ENCOUNTER — Ambulatory Visit (INDEPENDENT_AMBULATORY_CARE_PROVIDER_SITE_OTHER): Payer: 59 | Admitting: Family Medicine

## 2023-04-04 ENCOUNTER — Other Ambulatory Visit (HOSPITAL_BASED_OUTPATIENT_CLINIC_OR_DEPARTMENT_OTHER): Payer: Self-pay

## 2023-04-04 ENCOUNTER — Encounter (INDEPENDENT_AMBULATORY_CARE_PROVIDER_SITE_OTHER): Payer: Self-pay | Admitting: Family Medicine

## 2023-04-04 VITALS — BP 131/82 | HR 65 | Temp 97.6°F | Ht 65.0 in | Wt 257.0 lb

## 2023-04-04 DIAGNOSIS — Z6841 Body Mass Index (BMI) 40.0 and over, adult: Secondary | ICD-10-CM | POA: Diagnosis not present

## 2023-04-04 DIAGNOSIS — E669 Obesity, unspecified: Secondary | ICD-10-CM | POA: Diagnosis not present

## 2023-04-04 DIAGNOSIS — F3289 Other specified depressive episodes: Secondary | ICD-10-CM

## 2023-04-04 DIAGNOSIS — R7303 Prediabetes: Secondary | ICD-10-CM | POA: Diagnosis not present

## 2023-04-04 DIAGNOSIS — F5089 Other specified eating disorder: Secondary | ICD-10-CM | POA: Diagnosis not present

## 2023-04-04 MED ORDER — TOPIRAMATE 50 MG PO TABS
50.0000 mg | ORAL_TABLET | Freq: Two times a day (BID) | ORAL | 0 refills | Status: DC
  Filled 2023-04-04: qty 60, 30d supply, fill #0

## 2023-04-04 MED ORDER — BUPROPION HCL ER (SR) 200 MG PO TB12
200.0000 mg | ORAL_TABLET | Freq: Every morning | ORAL | 0 refills | Status: DC
Start: 2023-04-04 — End: 2023-05-01
  Filled 2023-04-04: qty 30, 30d supply, fill #0

## 2023-04-04 MED ORDER — METFORMIN HCL 500 MG PO TABS
500.0000 mg | ORAL_TABLET | Freq: Two times a day (BID) | ORAL | 0 refills | Status: DC
  Filled 2023-04-04: qty 60, 30d supply, fill #0

## 2023-04-04 NOTE — Progress Notes (Signed)
 .smr  Office: [phone]  /  Fax: [phone]  WEIGHT SUMMARY AND BIOMETRICS  Anthropometric Measurements Height: 5\' 5"  (1.651 m) Weight: 257 lb (116.6 kg) BMI (Calculated): 42.77 Weight at Last Visit: 267 lb Weight Lost Since Last Visit: 10 lb Weight Gained Since Last Visit: 0 Starting Weight: 333 lb Total Weight Loss (lbs): 76 lb (34.5 kg) Peak Weight: 333 lb   Body Composition  Body Fat %: 53.1 % Fat Mass (lbs): 136.8 lbs Muscle Mass (lbs): 114.8 lbs Visceral Fat Rating : 17   Other Clinical Data Fasting: no Labs: no Today's Visit #: 77 Starting Date: 03/15/20    Chief Complaint: OBESITY    History of Present Illness   Pamela [name] is a 53 year old female with obesity and prediabetes who presents for obesity treatment plan monitoring and progress evaluation.  She has been following a low-carbohydrate diet for the past month, resulting in a weight loss of ten pounds. Initially, the first week was challenging, but she has since adjusted well, with a significant reduction in cravings. She is not currently engaging in regular exercise but is attempting to increase her walking. She is considering incorporating more fruits into her diet while maintaining a low intake of cheese.  She has a history of prediabetes and is actively working on improving her condition through diet, exercise, and weight loss. She is currently taking metformin 500 mg twice daily and has requested a refill.  She has a history of emotional eating behaviors and is on Wellbutrin 200 mg in the morning and topiramate 50 mg twice a day. She reports stability on these medications and requests refills. She is working on identifying emotional eating situations and developing strategies to avoid them.          PHYSICAL EXAM:  Blood pressure 131/82, pulse 65, temperature 97.6 F (36.4 C), height 5\' 5"  (1.651 m), weight 257 lb (116.6 kg), last menstrual period 03/16/2021, SpO2 99%. Body mass index  is 42.77 kg/m.  DIAGNOSTIC DATA REVIEWED:  BMET    Component Value Date/Time   NA 136 01/16/2023 0851   K 4.0 01/16/2023 0851   CL 102 01/16/2023 0851   CO2 22 01/16/2023 0851   GLUCOSE 90 01/16/2023 0851   GLUCOSE 82 04/10/2021 1127   BUN 15 01/16/2023 0851   CREATININE 0.97 01/16/2023 0851   CALCIUM 9.1 01/16/2023 0851   GFRNONAA >60 04/10/2021 1127   GFRAA 80 03/15/2020 1033   Lab Results  Component Value Date   HGBA1C 5.7 (H) 01/16/2023   HGBA1C 6.4 (H) 03/15/2020   Lab Results  Component Value Date   INSULIN 4.0 06/26/2022   INSULIN 30.0 (H) 03/15/2020   Lab Results  Component Value Date   TSH 1.390 01/16/2023   CBC    Component Value Date/Time   WBC 3.8 01/16/2023 0851   WBC 4.7 04/10/2021 1127   RBC 4.64 01/16/2023 0851   RBC 4.62 04/10/2021 1127   HGB 14.5 01/16/2023 0851   HCT 43.8 01/16/2023 0851   PLT 283 01/16/2023 0851   MCV 94 01/16/2023 0851   MCH 31.3 01/16/2023 0851   MCH 30.5 04/10/2021 1127   MCHC 33.1 01/16/2023 0851   MCHC 34.6 04/10/2021 1127   RDW 12.6 01/16/2023 0851   Iron Studies    Component Value Date/Time   IRON 63 07/28/2020 0920   TIBC 274 07/28/2020 0920   FERRITIN 57 07/28/2020 0920   IRONPCTSAT 23 07/28/2020 0920   Lipid Panel  Component Value Date/Time   CHOL 210 (H) 01/16/2023 0851   TRIG 73 01/16/2023 0851   HDL 87 01/16/2023 0851   CHOLHDL 2.4 01/16/2023 0851   LDLCALC 110 (H) 01/16/2023 0851   Hepatic Function Panel     Component Value Date/Time   PROT 6.9 01/16/2023 0851   ALBUMIN 4.1 01/16/2023 0851   AST 14 01/16/2023 0851   ALT 11 01/16/2023 0851   ALKPHOS 68 01/16/2023 0851   BILITOT 0.2 01/16/2023 0851      Component Value Date/Time   TSH 1.390 01/16/2023 0851   Nutritional Lab Results  Component Value Date   VD25OH 55.9 01/16/2023   VD25OH 50.1 06/26/2022   VD25OH 95.2 11/23/2021     Assessment and Plan    Obesity Patient has lost ten pounds in the last month on a low-carb  diet. She is not currently exercising but is increasing her walking. Discussed the importance of maintaining muscle mass through strengthening exercises to prevent a drop in metabolism. Explained that as weight is lost, muscle mass can decrease if not actively maintained, which can lower metabolism and hinder further weight loss. Discussed the benefits of combining cardio and strengthening exercises, such as walking on inclines and chair yoga, to maintain muscle mass and overall health. - Continue low-carb diet with the option to incorporate one fruit per day - Introduce strengthening exercises such as chair yoga and assisted squats - Encourage walking, especially on inclines, to combine cardio and strengthening - Refill Wellbutrin 200 mg in the morning - Refill topiramate 50 mg twice a day  Emotional Eating Behaviors Patient is well-managed on Wellbutrin and topiramate. She is working on identifying emotional eating triggers and developing coping strategies. - Refill Wellbutrin 200 mg in the morning - Refill topiramate 50 mg twice a day - Continue to work on identifying and managing emotional eating situations  Prediabetes Patient is working on diet, exercise, and weight loss to improve prediabetes. She is currently on metformin 500 mg bid. Discussed the importance of continuing to decrease simple carbohydrates to manage insulin levels and reduce cravings. - Refill metformin 500 mg bid - Continue working on decreasing simple carbohydrates - Encourage weight loss and exercise  Follow-up - Follow up in March - Schedule next appointment for April.       She was informed of the importance of frequent follow up visits to maximize her success with intensive lifestyle modifications for her multiple health conditions.    Quillian Quince, MD

## 2023-05-01 ENCOUNTER — Ambulatory Visit (INDEPENDENT_AMBULATORY_CARE_PROVIDER_SITE_OTHER): Payer: 59 | Admitting: Family Medicine

## 2023-05-01 ENCOUNTER — Encounter (INDEPENDENT_AMBULATORY_CARE_PROVIDER_SITE_OTHER): Payer: Self-pay | Admitting: Family Medicine

## 2023-05-01 ENCOUNTER — Other Ambulatory Visit (HOSPITAL_BASED_OUTPATIENT_CLINIC_OR_DEPARTMENT_OTHER): Payer: Self-pay

## 2023-05-01 VITALS — BP 138/84 | HR 56 | Temp 97.6°F | Ht 65.0 in | Wt 259.0 lb

## 2023-05-01 DIAGNOSIS — E559 Vitamin D deficiency, unspecified: Secondary | ICD-10-CM | POA: Diagnosis not present

## 2023-05-01 DIAGNOSIS — R7303 Prediabetes: Secondary | ICD-10-CM | POA: Diagnosis not present

## 2023-05-01 DIAGNOSIS — Z6841 Body Mass Index (BMI) 40.0 and over, adult: Secondary | ICD-10-CM

## 2023-05-01 DIAGNOSIS — F5089 Other specified eating disorder: Secondary | ICD-10-CM

## 2023-05-01 DIAGNOSIS — I10 Essential (primary) hypertension: Secondary | ICD-10-CM

## 2023-05-01 DIAGNOSIS — E669 Obesity, unspecified: Secondary | ICD-10-CM

## 2023-05-01 DIAGNOSIS — F3289 Other specified depressive episodes: Secondary | ICD-10-CM

## 2023-05-01 MED ORDER — BUPROPION HCL ER (SR) 200 MG PO TB12
200.0000 mg | ORAL_TABLET | Freq: Every morning | ORAL | 0 refills | Status: DC
Start: 2023-05-01 — End: 2023-05-29
  Filled 2023-05-01: qty 30, 30d supply, fill #0

## 2023-05-01 MED ORDER — METFORMIN HCL 500 MG PO TABS
500.0000 mg | ORAL_TABLET | Freq: Two times a day (BID) | ORAL | 0 refills | Status: DC
  Filled 2023-05-01: qty 60, 30d supply, fill #0

## 2023-05-01 MED ORDER — TOPIRAMATE 50 MG PO TABS
50.0000 mg | ORAL_TABLET | Freq: Two times a day (BID) | ORAL | 0 refills | Status: DC
  Filled 2023-05-01: qty 60, 30d supply, fill #0

## 2023-05-01 NOTE — Progress Notes (Signed)
 Office: [phone]  /  Fax: [phone]  WEIGHT SUMMARY AND BIOMETRICS  Anthropometric Measurements Height: 5\' 5"  (1.651 m) Weight: 259 lb (117.5 kg) BMI (Calculated): 43.1 Weight at Last Visit: 257 lb Weight Lost Since Last Visit: 0 Weight Gained Since Last Visit: 2 lb Starting Weight: 333 lb Total Weight Loss (lbs): 74 lb (33.6 kg) Peak Weight: 333 lb   Body Composition  Body Fat %: 48.2 % Fat Mass (lbs): 125 lbs Muscle Mass (lbs): 127.6 lbs Total Body Water (lbs): 98 lbs Visceral Fat Rating : 17   Other Clinical Data Fasting: No Labs: No Today's Visit #: 44 Starting Date: 03/15/20    Chief Complaint: OBESITY   History of Present Illness   Pamela [name] is a 53 year old female with obesity and prediabetes who presents for obesity treatment and progress assessment.  She has maintained her weight over the last month since her last visit. She is working on diet, exercise, and weight loss to manage her prediabetes. She is currently taking metformin and requests a refill to help reduce the chance of becoming diabetic.  She experiences emotional eating behaviors, which she is actively working to recognize and address. She is on Wellbutrin and topiramate to help manage this and requests a refill. Despite challenges, such as traveling to [facility], which disrupted her meal preparation routine, she has been able to maintain her weight. She acknowledges the need to reset and restart her routine, particularly focusing on meal preparation and gym attendance.  In terms of physical activity, she has been walking regularly and has started lifting weights, focusing on a full-body workout. However, she has not been to the gym for about two weeks since her trip to [place].  Her sleep is going well, and she states she can sleep enough without any problems. She typically goes to bed around 7 PM and wakes up at 4:30 AM, although sometimes she goes to bed later due to commitments  like rotary meetings.  She has added a calcium supplement on her fruit days, which is algae-based and does not contain vitamin D, as she is already taking an immune boost vitamin D supplement.          PHYSICAL EXAM:  Blood pressure 138/84, pulse (!) 56, temperature 97.6 F (36.4 C), height 5\' 5"  (1.651 m), weight 259 lb (117.5 kg), last menstrual period 03/16/2021, SpO2 100%. Body mass index is 43.1 kg/m.  DIAGNOSTIC DATA REVIEWED:  BMET    Component Value Date/Time   NA 136 01/16/2023 0851   K 4.0 01/16/2023 0851   CL 102 01/16/2023 0851   CO2 22 01/16/2023 0851   GLUCOSE 90 01/16/2023 0851   GLUCOSE 82 04/10/2021 1127   BUN 15 01/16/2023 0851   CREATININE 0.97 01/16/2023 0851   CALCIUM 9.1 01/16/2023 0851   GFRNONAA >60 04/10/2021 1127   GFRAA 80 03/15/2020 1033   Lab Results  Component Value Date   HGBA1C 5.7 (H) 01/16/2023   HGBA1C 6.4 (H) 03/15/2020   Lab Results  Component Value Date   INSULIN 4.0 06/26/2022   INSULIN 30.0 (H) 03/15/2020   Lab Results  Component Value Date   TSH 1.390 01/16/2023   CBC    Component Value Date/Time   WBC 3.8 01/16/2023 0851   WBC 4.7 04/10/2021 1127   RBC 4.64 01/16/2023 0851   RBC 4.62 04/10/2021 1127   HGB 14.5 01/16/2023 0851   HCT 43.8 01/16/2023 0851   PLT 283 01/16/2023 0851   MCV  94 01/16/2023 0851   MCH 31.3 01/16/2023 0851   MCH 30.5 04/10/2021 1127   MCHC 33.1 01/16/2023 0851   MCHC 34.6 04/10/2021 1127   RDW 12.6 01/16/2023 0851   Iron Studies    Component Value Date/Time   IRON 63 07/28/2020 0920   TIBC 274 07/28/2020 0920   FERRITIN 57 07/28/2020 0920   IRONPCTSAT 23 07/28/2020 0920   Lipid Panel     Component Value Date/Time   CHOL 210 (H) 01/16/2023 0851   TRIG 73 01/16/2023 0851   HDL 87 01/16/2023 0851   CHOLHDL 2.4 01/16/2023 0851   LDLCALC 110 (H) 01/16/2023 0851   Hepatic Function Panel     Component Value Date/Time   PROT 6.9 01/16/2023 0851   ALBUMIN 4.1 01/16/2023 0851    AST 14 01/16/2023 0851   ALT 11 01/16/2023 0851   ALKPHOS 68 01/16/2023 0851   BILITOT 0.2 01/16/2023 0851      Component Value Date/Time   TSH 1.390 01/16/2023 0851   Nutritional Lab Results  Component Value Date   VD25OH 55.9 01/16/2023   VD25OH 50.1 06/26/2022   VD25OH 95.2 11/23/2021     Assessment and Plan    Obesity  and Emotional Eating Behaviors She is managing her weight through diet, exercise, and lifestyle changes. Despite challenges with meal prep and routine due to travel, she has maintained her weight. Emotional eating is being addressed with Wellbutrin and topiramate, which she requests to continue. She engages in physical activities such as walking and gym workouts, with a recent lapse due to travel. She considered compounded medications for weight loss but was advised against them due to regulation concerns and potential health risks, including malnutrition.   - Continue current diet and exercise regimen   - Refill Wellbutrin and topiramate   - Encourage resumption of gym workouts and meal prep routine   - Avoid compounded medications for weight loss due to potential risks    Prediabetes   She is managing prediabetes with lifestyle modifications and metformin. She requests a refill of metformin to reduce the risk of progression to diabetes.   - Refill metformin   - Encourage adherence to diet and exercise plan    Hypertension   Her hypertension is controlled with losartan. Blood pressure today is 138/84 mmHg.   - Continue losartan   - Monitor blood pressure regularly    Vit D deficiency She takes a calcium supplement on fruit days, which is algae-based and lacks vitamin D, as she is already taking a vitamin D supplement. She is considering switching to a multivitamin.   - Consider switching to a multivitamin with appropriate calcium and vitamin D levels   - Monitor calcium and vitamin D levels during next lab check       She was informed of the importance  of frequent follow up visits to maximize her success with intensive lifestyle modifications for her multiple health conditions.    Quillian Quince, MD

## 2023-05-13 ENCOUNTER — Other Ambulatory Visit (HOSPITAL_BASED_OUTPATIENT_CLINIC_OR_DEPARTMENT_OTHER): Payer: Self-pay

## 2023-05-29 ENCOUNTER — Other Ambulatory Visit (HOSPITAL_BASED_OUTPATIENT_CLINIC_OR_DEPARTMENT_OTHER): Payer: Self-pay

## 2023-05-29 ENCOUNTER — Encounter (INDEPENDENT_AMBULATORY_CARE_PROVIDER_SITE_OTHER): Payer: Self-pay | Admitting: Family Medicine

## 2023-05-29 ENCOUNTER — Ambulatory Visit (INDEPENDENT_AMBULATORY_CARE_PROVIDER_SITE_OTHER): Payer: 59 | Admitting: Family Medicine

## 2023-05-29 VITALS — BP 108/73 | HR 70 | Temp 98.9°F | Ht 65.0 in | Wt 265.0 lb

## 2023-05-29 DIAGNOSIS — F5089 Other specified eating disorder: Secondary | ICD-10-CM | POA: Diagnosis not present

## 2023-05-29 DIAGNOSIS — Z6841 Body Mass Index (BMI) 40.0 and over, adult: Secondary | ICD-10-CM | POA: Diagnosis not present

## 2023-05-29 DIAGNOSIS — F3289 Other specified depressive episodes: Secondary | ICD-10-CM

## 2023-05-29 DIAGNOSIS — E669 Obesity, unspecified: Secondary | ICD-10-CM

## 2023-05-29 DIAGNOSIS — R7303 Prediabetes: Secondary | ICD-10-CM

## 2023-05-29 MED ORDER — BUPROPION HCL ER (SR) 200 MG PO TB12
200.0000 mg | ORAL_TABLET | Freq: Every morning | ORAL | 0 refills | Status: DC
  Filled 2023-05-29 – 2023-06-10 (×2): qty 30, 30d supply, fill #0

## 2023-05-29 MED ORDER — METFORMIN HCL 500 MG PO TABS
500.0000 mg | ORAL_TABLET | Freq: Every day | ORAL | 0 refills | Status: DC
  Filled 2023-05-29 – 2023-06-10 (×2): qty 30, 30d supply, fill #0

## 2023-05-29 MED ORDER — TOPIRAMATE 50 MG PO TABS
50.0000 mg | ORAL_TABLET | Freq: Two times a day (BID) | ORAL | 0 refills | Status: DC
  Filled 2023-05-29 – 2023-06-10 (×2): qty 60, 30d supply, fill #0

## 2023-05-29 NOTE — Progress Notes (Signed)
 Office: [phone]  /  Fax: [phone]  WEIGHT SUMMARY AND BIOMETRICS  Anthropometric Measurements Height: 5\' 5"  (1.651 m) Weight: 265 lb (120.2 kg) BMI (Calculated): 44.1 Weight at Last Visit: 259 LB Weight Lost Since Last Visit: 0 Weight Gained Since Last Visit: 6 lb Starting Weight: 333 LB Total Weight Loss (lbs): 68 lb (30.8 kg) Peak Weight: 333 LB   Body Composition  Body Fat %: 51.8 % Fat Mass (lbs): 137.4 lbs Muscle Mass (lbs): 121.4 lbs Total Body Water  (lbs): 100.8 lbs Visceral Fat Rating : 17   Other Clinical Data Fasting: NO Labs: NO Today's Visit #: 45 Starting Date: 03/15/20    Chief Complaint: OBESITY    History of Present Illness Pamela [name] is a 53 year old female with obesity and prediabetes who presents for obesity treatment plan assessment and progress evaluation.  She was prescribed a 1300 calorie journaling plan but has not been adhering to it. She is not currently engaging in physical exercise and has gained six pounds in the last month since her last visit. She has a history of emotional eating behaviors and is being treated with Wellbutrin  and topiramate , for which she requests a refill.  She has a history of prediabetes and is being treated with metformin , also requesting a refill. Her last glucose level was 90, and her A1c was in the 5s, indicating prediabetes. Previously, her A1c was 6.4, close to the diabetes threshold of 6.5.  She experiences challenges with maintaining her weight loss routine, particularly during holidays and special occasions such as spring break and her husband's birthday, noting a pattern of losing weight and then regaining it during these times. She desires a sustainable solution to manage her hunger and cravings, expressing concern about protein intake if she were to find a low-calorie option that fills her up.  She has concerns about the implications of weight loss surgery, particularly regarding lifestyle  changes and potential complications. A family history of colon cancer influences her consideration of surgical options. She discusses the impact of obesity on her life, including the need for a supportive environment and the role of her husband in her weight management efforts.      PHYSICAL EXAM:  Blood pressure 108/73, pulse 70, temperature 98.9 F (37.2 C), height 5\' 5"  (1.651 m), weight 265 lb (120.2 kg), last menstrual period 03/16/2021, SpO2 99%. Body mass index is 44.1 kg/m.  DIAGNOSTIC DATA REVIEWED:  BMET    Component Value Date/Time   NA 136 01/16/2023 0851   K 4.0 01/16/2023 0851   CL 102 01/16/2023 0851   CO2 22 01/16/2023 0851   GLUCOSE 90 01/16/2023 0851   GLUCOSE 82 04/10/2021 1127   BUN 15 01/16/2023 0851   CREATININE 0.97 01/16/2023 0851   CALCIUM 9.1 01/16/2023 0851   GFRNONAA >60 04/10/2021 1127   GFRAA 80 03/15/2020 1033   Lab Results  Component Value Date   HGBA1C 5.7 (H) 01/16/2023   HGBA1C 6.4 (H) 03/15/2020   Lab Results  Component Value Date   INSULIN  4.0 06/26/2022   INSULIN  30.0 (H) 03/15/2020   Lab Results  Component Value Date   TSH 1.390 01/16/2023   CBC    Component Value Date/Time   WBC 3.8 01/16/2023 0851   WBC 4.7 04/10/2021 1127   RBC 4.64 01/16/2023 0851   RBC 4.62 04/10/2021 1127   HGB 14.5 01/16/2023 0851   HCT 43.8 01/16/2023 0851   PLT 283 01/16/2023 0851   MCV 94 01/16/2023 0851  MCH 31.3 01/16/2023 0851   MCH 30.5 04/10/2021 1127   MCHC 33.1 01/16/2023 0851   MCHC 34.6 04/10/2021 1127   RDW 12.6 01/16/2023 0851   Iron Studies    Component Value Date/Time   IRON 63 07/28/2020 0920   TIBC 274 07/28/2020 0920   FERRITIN 57 07/28/2020 0920   IRONPCTSAT 23 07/28/2020 0920   Lipid Panel     Component Value Date/Time   CHOL 210 (H) 01/16/2023 0851   TRIG 73 01/16/2023 0851   HDL 87 01/16/2023 0851   CHOLHDL 2.4 01/16/2023 0851   LDLCALC 110 (H) 01/16/2023 0851   Hepatic Function Panel     Component Value  Date/Time   PROT 6.9 01/16/2023 0851   ALBUMIN 4.1 01/16/2023 0851   AST 14 01/16/2023 0851   ALT 11 01/16/2023 0851   ALKPHOS 68 01/16/2023 0851   BILITOT 0.2 01/16/2023 0851      Component Value Date/Time   TSH 1.390 01/16/2023 0851   Nutritional Lab Results  Component Value Date   VD25OH 55.9 01/16/2023   VD25OH 50.1 06/26/2022   VD25OH 95.2 11/23/2021     Assessment and Plan Assessment & Plan Obesity Obesity is a chronic condition characterized by excess body weight. She has gained six pounds in the last month and struggles with adherence to a 1300 calorie diet. She is not exercising and exhibits emotional eating behaviors. She is considering bariatric surgery, exploring options like gastric sleeve and gastric bypass. Concerns include permanent lifestyle changes and potential complications, especially with gastric bypass due to a family history of colon cancer. She meets BMI criteria for surgery and is encouraged to view it as a tool for weight loss alongside behavioral changes. Gastric sleeve, the most common surgery, involves stomach reduction to decrease hunger hormones. Gastric bypass reroutes intestines to reduce calorie absorption, offering more weight loss but involves more complex surgery. Both require an initial liquid diet and lifelong bariatric vitamins due to absorption issues. - Refer to [facility] for bariatric surgery consultation. - Encourage viewing of the informational video on [employer] before scheduling a consultation. - Discuss the importance of a support system and involve her husband in the weight loss journey. - Refill topiramate  prescription.  Emotional eating behaviors Emotional eating behaviors contribute to difficulty adhering to a weight loss plan. She is treated with Wellbutrin , aiding emotional regulation. She is aware of triggers, such as holidays and special occasions, and is working on Research scientist (physical sciences). - Refill Wellbutrin  prescription. - Provide a support handout to engage her support system in her weight loss journey.  Prediabetes Prediabetes is characterized by elevated blood glucose levels not yet high enough for diabetes. Her glucose level is 90, and A1c is in the prediabetes range, improved from 6.4. She is treated with metformin , aiding in blood glucose management. - Refill metformin  prescription. - Monitor blood glucose and A1c levels regularly.       She was informed of the importance of frequent follow up visits to maximize her success with intensive lifestyle modifications for her multiple health conditions.    Jasmine Mesi, MD

## 2023-06-01 ENCOUNTER — Encounter: Payer: Self-pay | Admitting: Family Medicine

## 2023-06-10 ENCOUNTER — Other Ambulatory Visit (HOSPITAL_COMMUNITY): Payer: Self-pay

## 2023-06-10 ENCOUNTER — Other Ambulatory Visit (HOSPITAL_BASED_OUTPATIENT_CLINIC_OR_DEPARTMENT_OTHER): Payer: Self-pay

## 2023-06-10 ENCOUNTER — Other Ambulatory Visit: Payer: Self-pay

## 2023-06-11 ENCOUNTER — Ambulatory Visit: Payer: Self-pay | Admitting: Family

## 2023-06-11 ENCOUNTER — Ambulatory Visit: Payer: Self-pay | Admitting: Family Medicine

## 2023-06-11 ENCOUNTER — Encounter: Payer: Self-pay | Admitting: Family Medicine

## 2023-06-11 VITALS — BP 141/86 | HR 68 | Wt 282.9 lb

## 2023-06-11 DIAGNOSIS — Z6841 Body Mass Index (BMI) 40.0 and over, adult: Secondary | ICD-10-CM

## 2023-06-11 DIAGNOSIS — E669 Obesity, unspecified: Secondary | ICD-10-CM

## 2023-06-11 DIAGNOSIS — Z0289 Encounter for other administrative examinations: Secondary | ICD-10-CM

## 2023-06-11 DIAGNOSIS — R7303 Prediabetes: Secondary | ICD-10-CM

## 2023-06-11 DIAGNOSIS — F3289 Other specified depressive episodes: Secondary | ICD-10-CM

## 2023-06-11 NOTE — Progress Notes (Signed)
 Erroneous encounter-disregard

## 2023-06-12 ENCOUNTER — Encounter: Payer: Self-pay | Admitting: Family Medicine

## 2023-06-12 NOTE — Progress Notes (Signed)
 Established Patient Office Visit  Subjective    Patient ID: Pamela [name], female    DOB: 03-Feb-1971  Age: 53 y.o. MRN: [id number]  CC:  Chief Complaint  Patient presents with   paperwork     HPI Pamela N Soileau presents for completion of paperwork and forms for bariatric surgery. Patient reports that she has tried multiple modalities in the past 10 years to lose weight and that she has been stagnant now even gaining especially since her insurance stopped covering the GLP1  Outpatient Encounter Medications as of 06/11/2023  Medication Sig   albuterol  (VENTOLIN  HFA) 108 (90 Base) MCG/ACT inhaler Inhale 2 puffs into the lungs every 6 (six) hours as needed for wheezing or shortness of breath.   budesonide  (PULMICORT  FLEXHALER) 180 MCG/ACT inhaler With respiratory illness or flare up, start 1 puffs twice daily for 2 weeks.   buPROPion  (WELLBUTRIN  SR) 200 MG 12 hr tablet Take 1 tablet (200 mg total) by mouth every morning.   cetirizine  (ZYRTEC ) 10 MG tablet Take 1 tablet (10 mg total) by mouth daily.   doxycycline  (ADOXA) 100 MG tablet Take 1 tablet by mouth twice a day   fluticasone  (FLONASE ) 50 MCG/ACT nasal spray Place 2 sprays into both nostrils daily as needed for allergies or rhinitis.   losartan -hydrochlorothiazide  (HYZAAR ) 100-25 MG tablet Take 1 tablet by mouth daily.   meloxicam  (MOBIC ) 15 MG tablet Take 1 tablet (15 mg total) by mouth daily with a meal   metFORMIN  (GLUCOPHAGE ) 500 MG tablet Take 1 tablet (500 mg total) by mouth daily with breakfast.   Misc Natural Products (DAILY HERBS IMMUNE DEFENSE PO) Take by mouth.   montelukast  (SINGULAIR ) 10 MG tablet Take 1 tablet (10 mg total) by mouth at bedtime.   nitrofurantoin, macrocrystal-monohydrate, (MACROBID) 100 MG capsule Take 100 mg by mouth 2 (two) times daily.   pantoprazole  (PROTONIX ) 40 MG tablet Take 1 tablet (40 mg total) by mouth daily.   predniSONE  (DELTASONE ) 50 MG tablet Take 1 tablet (50 mg total) by mouth  daily with breakfast.   topiramate  (TOPAMAX ) 50 MG tablet Take 1 tablet (50 mg total) by mouth 2 (two) times daily.   No facility-administered encounter medications on file as of 06/11/2023.    Past Medical History:  Diagnosis Date   ADHD    Anxiety    Asthma    Patient follows with [facility] . LOV 03/24/21 with FNP Marinus Sic.As of 04/03/21, pt is using her inhaler 1 -2 times per day per pt.   Back pain    Constipation    COVID 03/09/2021   Patient took antiviral. As of 04/03/21, pt states that all symptoms are resolved.   GERD (gastroesophageal reflux disease)    Hypertension    IBS (irritable bowel syndrome)    Joint pain    Lower extremity edema    Obesity    Pre-diabetes    07/28/20 A1C 6.4 / Patient is taking Wegovy .   Sleep apnea    05/03/20 sleep study = moderate to severe sleep apnea. Wears CPAP.   Wears glasses     Past Surgical History:  Procedure Laterality Date   CESAREAN SECTION  1998   COLONOSCOPY  08/19/2020   OVARIAN CYST REMOVAL  1991   ROBOTIC ASSISTED LAPAROSCOPIC HYSTERECTOMY AND SALPINGECTOMY Bilateral 04/12/2021   Procedure: XI ROBOTIC ASSISTED LAPAROSCOPIC HYSTERECTOMY AND BILATERAL SALPINGECTOMY;  Surgeon: Meldon Sport, DO;  Location: [facility];  Service: Gynecology;  Laterality:  Bilateral;   TUBAL LIGATION  2008   WISDOM TOOTH EXTRACTION  1986    Family History  Problem Relation Age of Onset   Asthma Other    Heart failure Other    Hypertension Other    Hypertension Mother    Hypertension Father    Heart disease Father    Alcoholism Father     Social History   Socioeconomic History   Marital status: Married    Spouse name: Not on file   Number of children: Not on file   Years of education: Not on file   Highest education level: Not on file  Occupational History   Occupation: Educator   Tobacco Use   Smoking status: Never    Passive exposure: Never   Smokeless tobacco: Never  Vaping Use    Vaping status: Never Used  Substance and Sexual Activity   Alcohol use: Yes    Comment: occasionally, once per month   Drug use: No   Sexual activity: Yes    Birth control/protection: Surgical    Comment: tubal ligation  Other Topics Concern   Not on file  Social History Narrative   Lives at home with husband   Right handed   Caffeine: 1 cup/day   Social Drivers of Corporate investment banker Strain: Low Risk  (01/16/2023)   Overall Financial Resource Strain (CARDIA)    Difficulty of Paying Living Expenses: Not hard at all  Food Insecurity: No Food Insecurity (01/16/2023)   Hunger Vital Sign    Worried About Running Out of Food in the Last Year: Never true    Ran Out of Food in the Last Year: Never true  Transportation Needs: No Transportation Needs (01/16/2023)   PRAPARE - Administrator, Civil Service (Medical): No    Lack of Transportation (Non-Medical): No  Physical Activity: Insufficiently Active (01/16/2023)   Exercise Vital Sign    Days of Exercise per Week: 1 day    Minutes of Exercise per Session: 30 min  Stress: No Stress Concern Present (01/16/2023)   [employer] of Occupational Health - Occupational Stress Questionnaire    Feeling of Stress : Not at all  Social Connections: Socially Integrated (01/16/2023)   Social Connection and Isolation Panel [NHANES]    Frequency of Communication with Friends and Family: More than three times a week    Frequency of Social Gatherings with Friends and Family: More than three times a week    Attends Religious Services: More than 4 times per year    Active Member of [employer] or Organizations: Yes    Attends Banker Meetings: More than 4 times per year    Marital Status: Married  Catering manager Violence: Not At Risk (01/16/2023)   Humiliation, Afraid, Rape, and Kick questionnaire    Fear of Current or Ex-Partner: No    Emotionally Abused: No    Physically Abused: No    Sexually Abused: No     Review of Systems  All other systems reviewed and are negative.       Objective    BP (!) 141/86 (BP Location: Right Arm, Patient Position: Sitting, Cuff Size: Large)   Pulse 68   Wt 282 lb 13.6 oz (128.3 kg)   LMP 03/16/2021 (Exact Date)   SpO2 96%   BMI 47.07 kg/m   Physical Exam Vitals and nursing note reviewed.  Constitutional:      General: She is not in acute distress.    Appearance:  She is obese.  Cardiovascular:     Rate and Rhythm: Normal rate and regular rhythm.  Pulmonary:     Effort: Pulmonary effort is normal.     Breath sounds: Normal breath sounds.  Abdominal:     Palpations: Abdomen is soft.     Tenderness: There is no abdominal tenderness.  Neurological:     General: No focal deficit present.     Mental Status: She is alert and oriented to person, place, and time.         Assessment & Plan:   Encounter for completion of form with patient  BMI 45.0-49.9, adult (HCC)  Pre-diabetes  Emotional Eating Behaviors     Return in about 6 months (around 12/12/2023), or if symptoms worsen or fail to improve, for follow up.   Arlo Lama, MD

## 2023-06-26 ENCOUNTER — Ambulatory Visit: Payer: Self-pay | Admitting: Family

## 2023-06-26 ENCOUNTER — Ambulatory Visit (INDEPENDENT_AMBULATORY_CARE_PROVIDER_SITE_OTHER): Payer: 59 | Admitting: Family Medicine

## 2023-06-26 ENCOUNTER — Encounter (INDEPENDENT_AMBULATORY_CARE_PROVIDER_SITE_OTHER): Payer: Self-pay | Admitting: Family Medicine

## 2023-06-26 ENCOUNTER — Other Ambulatory Visit (HOSPITAL_BASED_OUTPATIENT_CLINIC_OR_DEPARTMENT_OTHER): Payer: Self-pay

## 2023-06-26 VITALS — BP 125/74 | HR 65 | Temp 98.2°F | Ht 65.0 in | Wt 274.0 lb

## 2023-06-26 DIAGNOSIS — F5089 Other specified eating disorder: Secondary | ICD-10-CM

## 2023-06-26 DIAGNOSIS — R7303 Prediabetes: Secondary | ICD-10-CM | POA: Diagnosis not present

## 2023-06-26 DIAGNOSIS — E669 Obesity, unspecified: Secondary | ICD-10-CM

## 2023-06-26 DIAGNOSIS — Z6841 Body Mass Index (BMI) 40.0 and over, adult: Secondary | ICD-10-CM

## 2023-06-26 DIAGNOSIS — F3289 Other specified depressive episodes: Secondary | ICD-10-CM

## 2023-06-26 MED ORDER — TOPIRAMATE 50 MG PO TABS
50.0000 mg | ORAL_TABLET | Freq: Two times a day (BID) | ORAL | 0 refills | Status: AC
  Filled 2023-06-26: qty 60, 30d supply, fill #0

## 2023-06-26 MED ORDER — BUPROPION HCL ER (SR) 200 MG PO TB12
200.0000 mg | ORAL_TABLET | Freq: Every morning | ORAL | 0 refills | Status: AC
  Filled 2023-06-26 – 2023-10-24 (×2): qty 30, 30d supply, fill #0

## 2023-06-26 MED ORDER — METFORMIN HCL 500 MG PO TABS
500.0000 mg | ORAL_TABLET | Freq: Every day | ORAL | 0 refills | Status: DC
  Filled 2023-06-26 – 2023-10-24 (×2): qty 30, 30d supply, fill #0

## 2023-06-26 NOTE — Progress Notes (Signed)
 Office: [phone]  /  Fax: [phone]  WEIGHT SUMMARY AND BIOMETRICS  Anthropometric Measurements Height: 5\' 5"  (1.651 m) Weight: 274 lb (124.3 kg) BMI (Calculated): 45.6 Weight at Last Visit: 265 lb Weight Lost Since Last Visit: 0 Weight Gained Since Last Visit: 9 lb Starting Weight: 333 lb Total Weight Loss (lbs): 59 lb (26.8 kg) Peak Weight: 333 lb   Body Composition  Body Fat %: 52.1 % Fat Mass (lbs): 143 lbs Muscle Mass (lbs): 124.8 lbs Visceral Fat Rating : 18   Other Clinical Data Fasting: no Labs: no Today's Visit #: 36 Starting Date: 03/15/20    Chief Complaint: OBESITY   Discussed the use of AI scribe software for clinical note transcription with the patient, who gave verbal consent to proceed.  History of Present Illness Pamela [name] is a 53 year old female with obesity who presents for treatment plan assessment and progress evaluation.  She is currently on a prescribed diet plan of 1350 calories and 90 or more grams of protein, which she is not following. She has gained nine pounds in the last month and is not engaging in regular physical activity. She attributes her lack of discipline in eating habits to the anticipation of an upcoming surgery, describing a tendency to indulge in food as she expects a significant lifestyle change post-surgery.  In addition to obesity, she is managing prediabetes with diet, exercise, weight loss, and metformin . However, she is not adhering to her dietary plan and has not been exercising. Her current diet includes meals from [employer] and snacks like cheese crackers and chocolate bars, which she consumes out of convenience despite acknowledging they do not taste or feel good.  She has a history of significant emotional eating behaviors, which she is treating with Wellbutrin  and topiramate . She describes a lack of discipline in her eating habits and a tendency to indulge in foods she enjoys. She wants to reset her  mindset and improve her eating habits, acknowledging the need for support from her husband, which she has not yet sought.  She is currently taking metformin  for prediabetes, and Wellbutrin  and topiramate  for emotional eating behaviors. She has not been following her prescribed dietary plan and is not engaging in regular physical activity.      PHYSICAL EXAM:  Blood pressure 125/74, pulse 65, temperature 98.2 F (36.8 C), height 5\' 5"  (1.651 m), weight 274 lb (124.3 kg), last menstrual period 03/16/2021, SpO2 100%. Body mass index is 45.6 kg/m.  DIAGNOSTIC DATA REVIEWED:  BMET    Component Value Date/Time   NA 136 01/16/2023 0851   K 4.0 01/16/2023 0851   CL 102 01/16/2023 0851   CO2 22 01/16/2023 0851   GLUCOSE 90 01/16/2023 0851   GLUCOSE 82 04/10/2021 1127   BUN 15 01/16/2023 0851   CREATININE 0.97 01/16/2023 0851   CALCIUM 9.1 01/16/2023 0851   GFRNONAA >60 04/10/2021 1127   GFRAA 80 03/15/2020 1033   Lab Results  Component Value Date   HGBA1C 5.7 (H) 01/16/2023   HGBA1C 6.4 (H) 03/15/2020   Lab Results  Component Value Date   INSULIN  4.0 06/26/2022   INSULIN  30.0 (H) 03/15/2020   Lab Results  Component Value Date   TSH 1.390 01/16/2023   CBC    Component Value Date/Time   WBC 3.8 01/16/2023 0851   WBC 4.7 04/10/2021 1127   RBC 4.64 01/16/2023 0851   RBC 4.62 04/10/2021 1127   HGB 14.5 01/16/2023 0851   HCT 43.8 01/16/2023  0851   PLT 283 01/16/2023 0851   MCV 94 01/16/2023 0851   MCH 31.3 01/16/2023 0851   MCH 30.5 04/10/2021 1127   MCHC 33.1 01/16/2023 0851   MCHC 34.6 04/10/2021 1127   RDW 12.6 01/16/2023 0851   Iron Studies    Component Value Date/Time   IRON 63 07/28/2020 0920   TIBC 274 07/28/2020 0920   FERRITIN 57 07/28/2020 0920   IRONPCTSAT 23 07/28/2020 0920   Lipid Panel     Component Value Date/Time   CHOL 210 (H) 01/16/2023 0851   TRIG 73 01/16/2023 0851   HDL 87 01/16/2023 0851   CHOLHDL 2.4 01/16/2023 0851   LDLCALC 110 (H)  01/16/2023 0851   Hepatic Function Panel     Component Value Date/Time   PROT 6.9 01/16/2023 0851   ALBUMIN 4.1 01/16/2023 0851   AST 14 01/16/2023 0851   ALT 11 01/16/2023 0851   ALKPHOS 68 01/16/2023 0851   BILITOT 0.2 01/16/2023 0851      Component Value Date/Time   TSH 1.390 01/16/2023 0851   Nutritional Lab Results  Component Value Date   VD25OH 55.9 01/16/2023   VD25OH 50.1 06/26/2022   VD25OH 95.2 11/23/2021     Assessment and Plan Assessment & Plan Obesity Obesity with recent weight gain of nine pounds in the last month. She is not adhering to the prescribed diet plan of 1350 calories and 90 grams of protein, and is not currently exercising. She is scheduled for a bariatric surgery consultation on the 30th and must complete twelve nutrition visits prior to surgery. The 'last supper effect' may be contributing to her lack of dietary discipline. Bariatric surgery is considered a tool to aid in weight loss, requiring continued effort post-surgery. The gastric sleeve is preferred over the lap band due to fewer surgical complications and ease of procedure, but may worsen heartburn in some cases. - Proceed with scheduled bariatric surgery consultation on the 30th. - Complete twelve nutrition visits as required before surgery. - Encourage adherence to the prescribed diet plan of 1350 calories and 90 grams of protein. - Emphasize the importance of discipline in dietary habits and the concept of food as fuel. - Consider bariatric surgery as a tool to aid in weight loss, emphasizing the need for continued effort post-surgery. - Discuss potential risks of gastric sleeve surgery, including possible worsening of heartburn, and consult with the surgeon for further evaluation.  Emotional eating behaviors Significant emotional eating behaviors managed with Wellbutrin  and topiramate . She acknowledges self-indulgence in food due to upcoming lifestyle change with surgery. Discussion of the  psychological aspects of eating and the importance of understanding the reasons behind eating habits. - Continue Wellbutrin  and topiramate  as prescribed. - Encourage exploration of psychological aspects of eating and addressing emotional eating behaviors. - Discuss the importance of support systems and consider involving spouse in dietary support.  Prediabetes Prediabetes managed with metformin . She is working on diet, exercise, and weight loss to improve condition. Current lack of adherence to dietary and exercise recommendations may impact management of prediabetes. - Continue metformin  as prescribed. - Encourage adherence to dietary and exercise recommendations to improve prediabetes management.   She was informed of the importance of frequent follow up visits to maximize her success with intensive lifestyle modifications for her multiple health conditions.    Jasmine Mesi, MD

## 2023-07-08 ENCOUNTER — Other Ambulatory Visit (HOSPITAL_COMMUNITY): Payer: Self-pay | Admitting: Surgery

## 2023-07-24 ENCOUNTER — Ambulatory Visit (INDEPENDENT_AMBULATORY_CARE_PROVIDER_SITE_OTHER): Admitting: Family Medicine

## 2023-07-30 ENCOUNTER — Other Ambulatory Visit (HOSPITAL_COMMUNITY): Payer: Self-pay

## 2023-07-30 ENCOUNTER — Encounter (HOSPITAL_COMMUNITY): Payer: Self-pay

## 2023-07-30 NOTE — Progress Notes (Signed)
 error

## 2023-08-12 ENCOUNTER — Encounter: Payer: Self-pay | Attending: Surgery | Admitting: Dietician

## 2023-08-12 ENCOUNTER — Encounter: Payer: Self-pay | Admitting: Dietician

## 2023-08-12 VITALS — Ht 65.0 in | Wt 289.8 lb

## 2023-08-12 DIAGNOSIS — E669 Obesity, unspecified: Secondary | ICD-10-CM | POA: Diagnosis present

## 2023-08-12 NOTE — Progress Notes (Signed)
 Nutrition Assessment for Bariatric Surgery: Pre-Surgery Behavioral and Nutrition Intervention Program   Medical Nutrition Therapy  Appt Start Time: 1643    End Time: 1741  Patient was seen on 08/12/2023 for Pre-Operative Nutrition Assessment. Purpose of todays visit  enhance perioperative outcomes along with a healthy weight maintenance   Referral stated Supervised Weight Loss (SWL) visits needed: 12  Pt completed visits.   Pt has cleared nutrition requirements.   Planned surgery: Sleeve Gastrectomy Pt expectation of surgery: healthier lifestyle; relief from back pain (pinch)  NUTRITION ASSESSMENT   Anthropometrics  Start weight at [facility]: 289.8 lbs (date: 08/12/2023)  Height: 65 in BMI: 48.23 kg/m2     Clinical   Pharmacotherapy: History of weight loss medication used: metformin , topiramate , wegovy   Medical hx: IBS, obesity, HTN, sleep apnea Medications: metformin , topiramate , montelukast  (for flare ups), pantoprazole , meloxicam  (for knee), flonase , bupropion , losartan  Labs: iron 124, iron saturation 57, vit B12 Notable signs/symptoms: nothing noted Any previous deficiencies? 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   Current Physical Activity Recommendations state 150 minutes per week of moderate to vigorous movement including Cardio and 1-2 days of resistance activities as well as flexibility/balance activities:  Pts current physical activity: ADL, with 0% recommendation reached  Sleep Hygiene: duration and quality: very well; uses CPAP machine  Current Patient Perceived Stress Level as stated by pt on a scale of 1-10:  2       Stress Management Techniques: therapy,  breathing, containment jar, self talk  According to the Dietary Guidelines for Americans Recommendation: equivalent 1.5-2 cups fruits per day, equivalent 2-3 cups vegetables per day and at least half all grains whole  Fruit servings per day (on average): 1-2, meeting 66-100% recommendation  Non-starchy vegetable servings per day (on average): 3-4, meeting 100% recommendation  Whole Grains per day (on average): 0  Number of meals missed/skipped per week out of 21: 0  24-Hr Dietary Recall First Meal: scrambled eggs, grits, cheese, coffee Snack:  Second Meal: grilled chicken salad Snack:  Third Meal: tacos, queso dip, guacamole  Snack: popcorn Beverages: water , coffee  Alcoholic beverages per week: 0-2   Estimated Energy Needs Calories: 1500  NUTRITION DIAGNOSIS  Overweight/obesity ([place]-3.3) related to past poor dietary habits and physical inactivity as evidenced by patient w/ planned sleeve surgery following dietary guidelines for continued weight loss.  NUTRITION INTERVENTION  Nutrition counseling (C-1) and education (E-2) to facilitate bariatric surgery goals.  Educated pt on micronutrient deficiencies post-surgery and behavioral/dietary strategies to start in order to mitigate that risk   Behavioral and Dietary Interventions Pre-Op Goals Reviewed with the Patient Nutrition: Healthy Eating Behaviors Switch to non-caloric, non-carbonated and non-caffeinated beverages such as  water , unsweetened tea, Crystal Light and zero calorie beverages (aim for 64 oz. per day) Cut out grazing between meals or at night  Find a protein shake you like Eat every 3-5 hours        Eliminate distractions while eating (TV, computer, reading, driving, texting) Take 79-69 minutes to eat a meal  Decrease high sugar foods/decrease high fat/fried foods Eliminate alcoholic beverages Increase protein intake (eggs, fish, chicken, yogurt) before surgery Eat non starchy vegetables 2 times a day 7 days a  week Eat complex carbohydrates such as whole  grains and fruits   Behavioral Modification: Physical Activity Increase my usual daily activity (use stairs, park farther, etc.) Engage in _______________________  activity  _______ minutes ______ times per week  Other:    _________________________________________________________________     Problem Solving I will think about my usual eating patterns and how to tweak them How can my friends and family support me Barriers to starting my changes Learn and understand appetite verses hunger   Healthy Coping Allow for ___________ activities per week to help me manage stress Reframe negative thoughts I will keep a picture of someone or something that is my inspiration & look at it daily   Monitoring  Weigh myself once a week  Measure my progress by monitoring how my clothes fit Keep a food record of what I eat and drink for the next ________ (time period) Take pictures of what I eat and drink for the next ________ (time period) Use an app to count steps/day for the next_______ (time period) Measure my progress such as increased energy and more restful sleep Monitor your acid reflux and bowel habits, are they getting better?   *Goals that are bolded indicate the pt would like to start working towards these  Handouts Provided Include  Bariatric Surgery handouts (Nutrition Visits, Pre Surgery Behavioral Change Goals, Protein Shakes Brands to Choose From, Vitamins & Mineral Supplementation)  Learning Style & Readiness for Change Teaching method utilized: Visual, Auditory, and hands on  Demonstrated degree of understanding via: Teach Back  Readiness Level: preparation Barriers to learning/adherence to lifestyle change: nothing identified  RD's Notes for Next Visit     MONITORING & EVALUATION Dietary intake, weekly physical activity, body weight, and preoperative behavioral change goals   Next Steps  Pt has completed visits. No further  supervised visits required/recommended. Patient is to follow up at [facility] for pre-op class >2 weeks prior to scheduled surgery.

## 2023-08-13 ENCOUNTER — Ambulatory Visit (HOSPITAL_COMMUNITY): Payer: Self-pay | Admitting: Licensed Clinical Social Worker

## 2023-08-15 ENCOUNTER — Ambulatory Visit (HOSPITAL_COMMUNITY): Payer: Self-pay | Admitting: Licensed Clinical Social Worker

## 2023-08-15 DIAGNOSIS — F4320 Adjustment disorder, unspecified: Secondary | ICD-10-CM

## 2023-08-15 NOTE — Progress Notes (Signed)
 Pamela [name] is a 53 y.o. year old adult patient reporting to [facility] for preliminary screening to determine bariatric surgery eligibility. Patient reports that they have tried several weight loss interventions in the past, including GLP medication and medical weight loss management .  Pamela [name] reports current medical concerns/medical history of GERD, asthma, and hypertension. .  Patient reports past history of depression, anxiety, or other mental health disorders and has been seeing a therapist for long period of time. Pt reports that she feels she has good coping strategies and good ability to manage symptoms at time of assessment. Pamela [name] denies SI, HI, or perceptual disturbances at time of assessment. Patient denies substance use issues at time of assessment. Pamela [name]  reports that they are motivated to make positive changes to contribute to improved wellness and are seeking bariatric weight loss surgery as an intervention to support wellness goals.
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
## 2023-08-19 ENCOUNTER — Encounter (HOSPITAL_COMMUNITY): Payer: Self-pay

## 2023-08-19 ENCOUNTER — Other Ambulatory Visit (HOSPITAL_COMMUNITY): Payer: Self-pay

## 2023-08-28 ENCOUNTER — Encounter (HOSPITAL_COMMUNITY)
Admission: RE | Admit: 2023-08-28 | Discharge: 2023-08-28 | Disposition: A | Discharge status: Home / Self Care | Source: Ambulatory Visit | Attending: Surgery

## 2023-08-28 ENCOUNTER — Other Ambulatory Visit: Payer: Self-pay

## 2023-08-28 ENCOUNTER — Ambulatory Visit (HOSPITAL_COMMUNITY)
Admission: RE | Admit: 2023-08-28 | Discharge: 2023-08-28 | Disposition: A | Discharge status: Home / Self Care | Source: Ambulatory Visit | Attending: Surgery

## 2023-08-28 ENCOUNTER — Ambulatory Visit (HOSPITAL_COMMUNITY)
Admission: RE | Admit: 2023-08-28 | Discharge: 2023-08-28 | Disposition: A | Discharge status: Home / Self Care | Source: Ambulatory Visit | Attending: Surgery | Admitting: Surgery

## 2023-08-28 ENCOUNTER — Other Ambulatory Visit (HOSPITAL_COMMUNITY): Payer: Self-pay | Admitting: Surgery

## 2023-08-28 DIAGNOSIS — Z01818 Encounter for other preprocedural examination: Secondary | ICD-10-CM | POA: Insufficient documentation

## 2023-09-23 ENCOUNTER — Ambulatory Visit

## 2023-09-27 ENCOUNTER — Encounter (HOSPITAL_COMMUNITY): Payer: Self-pay

## 2023-09-30 ENCOUNTER — Encounter: Attending: General Surgery | Admitting: Skilled Nursing Facility1

## 2023-09-30 ENCOUNTER — Encounter: Payer: Self-pay | Admitting: Skilled Nursing Facility1

## 2023-09-30 VITALS — Wt 297.5 lb

## 2023-09-30 DIAGNOSIS — Z6841 Body Mass Index (BMI) 40.0 and over, adult: Secondary | ICD-10-CM | POA: Insufficient documentation

## 2023-09-30 DIAGNOSIS — E66813 Obesity, class 3: Secondary | ICD-10-CM | POA: Insufficient documentation

## 2023-09-30 NOTE — Progress Notes (Signed)
 Pre-Operative Nutrition Class:    Patient was seen on 09/30/2023 for Pre-Operative Bariatric Surgery Education at [facility]    Surgery date: 10/15/2023 Surgery type: sleeve Start weight at [facility]: 289.8 Weight today: 297.5   The following the learning objectives were met by the patient during this course: Identify Pre-Op Dietary Goals and will begin 2 weeks pre-operatively Identify appropriate sources of fluids and proteins  State protein recommendations and appropriate sources pre and post-operatively Identify Post-Operative Dietary Goals and will follow for 2 weeks post-operatively Identify appropriate multivitamin and calcium sources Describe the need for physical activity post-operatively and will follow MD recommendations State when to call healthcare provider regarding medication questions or post-operative complications When having a diagnosis of diabetes understanding hypoglycemia symptoms and the inclusion of 1 complex carbohydrate per meal  Handouts given during class include: Pre-Op Bariatric Surgery Diet Handout Protein Shake Handout Post-Op Bariatric Surgery Nutrition Handout BELT Program Information Flyer Support Group Information Flyer [facility] Bariatric Supplements Price List  Follow-Up Plan: Patient will follow-up at [facility] 2 weeks post operatively for diet advancement per MD.

## 2023-10-01 NOTE — Progress Notes (Signed)
 Sent message, via epic in basket, requesting orders in epic from Careers adviser.

## 2023-10-02 ENCOUNTER — Ambulatory Visit: Payer: Self-pay | Admitting: General Surgery

## 2023-10-08 NOTE — Patient Instructions (Signed)
 SURGICAL WAITING ROOM VISITATION  Patients having surgery or a procedure may have no more than 2 support people in the waiting area - these visitors may rotate.    Children under the age of 64 must have an adult with them who is not the patient.  Visitors with respiratory illnesses are discouraged from visiting and should remain at home.  If the patient needs to stay at the hospital during part of their recovery, the visitor guidelines for inpatient rooms apply. Pre-op nurse will coordinate an appropriate time for 1 support person to accompany patient in pre-op.  This support person may not rotate.    Please refer to the [facility] website for the visitor guidelines for Inpatients (after your surgery is over and you are in a regular room).       Your procedure is scheduled on: 10/15/23   Report to [facility] Main Entrance    Report to admitting at : 5:15 AM   Call this number if you have problems the morning of surgery [phone]   Do not eat food :After 6:00 PM the day before surgery.   After 6:00 PM you may have the following liquids until : 4:30 AM DAY OF SURGERY  Water  Non-Citrus Juices (without pulp, NO RED-Apple, White grape, White cranberry) Black Coffee (NO MILK/CREAM OR CREAMERS, sugar ok)  Clear Tea (NO MILK/CREAM OR CREAMERS, sugar ok) regular and decaf                             Plain Jell-O (NO RED)                                           Fruit ices (not with fruit pulp, NO RED)                                     Popsicles (NO RED)                                                               Sports drinks like Gatorade (NO RED)   The day of surgery:  Drink ONE (1) Pre-Surgery Clear G2 at: 4:30 AM the morning of surgery. Drink in one sitting. Do not sip.  This drink was given to you during your hospital  pre-op appointment visit. Nothing else to drink after completing the  Pre-Surgery Clear Ensure or G2.          If you have questions, please  contact your surgeon's office.  FOLLOW BOWEL PREP AND ANY ADDITIONAL PRE OP INSTRUCTIONS YOU RECEIVED FROM YOUR SURGEON'S OFFICE!!!   MORNING OF SURGERY DRINK:   DRINK 1 G2 drink BEFORE YOU LEAVE HOME, DRINK ALL OF THE  G2 DRINK AT ONE TIME.   NO SOLID FOOD AFTER 600 PM THE NIGHT BEFORE YOUR SURGERY. YOU MAY DRINK CLEAR FLUIDS. THE G2 DRINK YOU DRINK BEFORE YOU LEAVE HOME WILL BE THE LAST FLUIDS YOU DRINK BEFORE SURGERY.  PAIN IS EXPECTED AFTER SURGERY AND WILL NOT BE COMPLETELY ELIMINATED. AMBULATION AND TYLENOL  WILL HELP REDUCE  INCISIONAL AND GAS PAIN. MOVEMENT IS KEY!  YOU ARE EXPECTED TO BE OUT OF BED WITHIN 4 HOURS OF ADMISSION TO YOUR PATIENT ROOM.  SITTING IN THE RECLINER THROUGHOUT THE DAY IS IMPORTANT FOR DRINKING FLUIDS AND MOVING GAS THROUGHOUT THE GI TRACT.  COMPRESSION STOCKINGS SHOULD BE WORN [facility] STAY UNLESS YOU ARE WALKING.   INCENTIVE SPIROMETER SHOULD BE USED EVERY HOUR WHILE AWAKE TO DECREASE POST-OPERATIVE COMPLICATIONS SUCH AS PNEUMONIA.  WHEN DISCHARGED HOME, IT IS IMPORTANT TO CONTINUE TO WALK EVERY HOUR AND USE THE INCENTIVE SPIROMETER EVERY HOUR.     Oral Hygiene is also important to reduce your risk of infection.                                    Remember - BRUSH YOUR TEETH THE MORNING OF SURGERY WITH YOUR REGULAR TOOTHPASTE  DENTURES WILL BE REMOVED PRIOR TO SURGERY PLEASE DO NOT APPLY Poly grip OR ADHESIVES!!!   Do NOT smoke after Midnight   Stop all vitamins and herbal supplements 7 days before surgery.   Take these medicines the morning of surgery with A SIP OF WATER : bupropion ,pantoprazole .Use inhalers as usual and bring them.  DO NOT TAKE ANY ORAL DIABETIC MEDICATIONS DAY OF YOUR SURGERY  Bring CPAP mask and tubing day of surgery.                              You may not have any metal on your body including hair pins, jewelry, and body piercing             Do not wear make-up, lotions, powders, perfumes/cologne, or  deodorant  Do not wear nail polish including gel and S&S, artificial/acrylic nails, or any other type of covering on natural nails including finger and toenails. If you have artificial nails, gel coating, etc. that needs to be removed by a nail salon please have this removed prior to surgery or surgery may need to be canceled/ delayed if the surgeon/ anesthesia feels like they are unable to be safely monitored.   Do not shave  48 hours prior to surgery.    Do not bring valuables to the hospital. [facility] IS NOT             RESPONSIBLE   FOR VALUABLES.   Contacts, glasses, dentures or bridgework may not be worn into surgery.   Bring small overnight bag day of surgery.   DO NOT BRING YOUR HOME MEDICATIONS TO THE HOSPITAL. PHARMACY WILL DISPENSE MEDICATIONS LISTED ON YOUR MEDICATION LIST TO YOU DURING YOUR ADMISSION IN THE HOSPITAL!    Patients discharged on the day of surgery will not be allowed to drive home.  Someone NEEDS to stay with you for the first 24 hours after anesthesia.   Special Instructions: Bring a copy of your healthcare power of attorney and living will documents the day of surgery if you haven't scanned them before.              Please read over the following fact sheets you were given: IF YOU HAVE QUESTIONS ABOUT YOUR PRE-OP INSTRUCTIONS PLEASE CALL [phone].   If you received a COVID test during your pre-op visit  it is requested that you wear a mask when out in public, stay away from anyone that may not be feeling well and notify your surgeon if you develop  symptoms. If you test positive for Covid or have been in contact with anyone that has tested positive in the last 10 days please notify you surgeon.    [facility] - Preparing for Surgery Before surgery, you can play an important role.  Because skin is not sterile, your skin needs to be as free of germs as possible.  You can reduce the number of germs on your skin by washing with CHG (chlorahexidine gluconate) soap  before surgery.  CHG is an antiseptic cleaner which kills germs and bonds with the skin to continue killing germs even after washing. Please DO NOT use if you have an allergy to CHG or antibacterial soaps.  If your skin becomes reddened/irritated stop using the CHG and inform your nurse when you arrive at Short Stay. Do not shave (including legs and underarms) for at least 48 hours prior to the first CHG shower.  You may shave your face/neck. Please follow these instructions carefully:  1.  Shower with CHG Soap the night before surgery and the  morning of Surgery.  2.  If you choose to wash your hair, wash your hair first as usual with your  normal  shampoo.  3.  After you shampoo, rinse your hair and body thoroughly to remove the  shampoo.                           4.  Use CHG as you would any other liquid soap.  You can apply chg directly  to the skin and wash                       Gently with a scrungie or clean washcloth.  5.  Apply the CHG Soap to your body ONLY FROM THE NECK DOWN.   Do not use on face/ open                           Wound or open sores. Avoid contact with eyes, ears mouth and genitals (private parts).                       Wash face,  Genitals (private parts) with your normal soap.             6.  Wash thoroughly, paying special attention to the area where your surgery  will be performed.  7.  Thoroughly rinse your body with warm water  from the neck down.  8.  DO NOT shower/wash with your normal soap after using and rinsing off  the CHG Soap.                9.  Pat yourself dry with a clean towel.            10.  Wear clean pajamas.            11.  Place clean sheets on your bed the night of your first shower and do not  sleep with pets. Day of Surgery : Do not apply any lotions/deodorants the morning of surgery.  Please wear clean clothes to the hospital/surgery center.  FAILURE TO FOLLOW THESE INSTRUCTIONS MAY RESULT IN THE CANCELLATION OF YOUR SURGERY PATIENT  SIGNATURE_________________________________  NURSE SIGNATURE__________________________________  ________________________________________________________________________

## 2023-10-09 ENCOUNTER — Other Ambulatory Visit: Payer: Self-pay

## 2023-10-09 ENCOUNTER — Encounter (HOSPITAL_COMMUNITY): Payer: Self-pay

## 2023-10-09 ENCOUNTER — Encounter (HOSPITAL_COMMUNITY)
Admission: RE | Admit: 2023-10-09 | Discharge: 2023-10-09 | Disposition: A | Discharge status: Home / Self Care | Source: Ambulatory Visit | Attending: General Surgery | Admitting: General Surgery

## 2023-10-09 DIAGNOSIS — Z6841 Body Mass Index (BMI) 40.0 and over, adult: Secondary | ICD-10-CM | POA: Insufficient documentation

## 2023-10-09 DIAGNOSIS — Z01812 Encounter for preprocedural laboratory examination: Secondary | ICD-10-CM | POA: Diagnosis not present

## 2023-10-09 DIAGNOSIS — Z01818 Encounter for other preprocedural examination: Secondary | ICD-10-CM | POA: Diagnosis present

## 2023-10-09 HISTORY — DX: Depression, unspecified: F32.A

## 2023-10-09 HISTORY — DX: Unspecified osteoarthritis, unspecified site: M19.90

## 2023-10-09 LAB — CBC WITH DIFFERENTIAL/PLATELET
Abs Immature Granulocytes: 0.01 K/uL (ref 0.00–0.07)
Basophils Absolute: 0 K/uL (ref 0.0–0.1)
Basophils Relative: 1 %
Eosinophils Absolute: 0.1 K/uL (ref 0.0–0.5)
Eosinophils Relative: 2 %
HCT: 42.2 % (ref 36.0–46.0)
Hemoglobin: 14.1 g/dL (ref 12.0–15.0)
Immature Granulocytes: 0 %
Lymphocytes Relative: 36 %
Lymphs Abs: 2.1 K/uL (ref 0.7–4.0)
MCH: 29.9 pg (ref 26.0–34.0)
MCHC: 33.4 g/dL (ref 30.0–36.0)
MCV: 89.6 fL (ref 80.0–100.0)
Monocytes Absolute: 0.7 K/uL (ref 0.1–1.0)
Monocytes Relative: 11 %
Neutro Abs: 3 K/uL (ref 1.7–7.7)
Neutrophils Relative %: 50 %
Platelets: 261 K/uL (ref 150–400)
RBC: 4.71 MIL/uL (ref 3.87–5.11)
RDW: 12.3 % (ref 11.5–15.5)
WBC: 5.9 K/uL (ref 4.0–10.5)
nRBC: 0 % (ref 0.0–0.2)

## 2023-10-09 LAB — COMPREHENSIVE METABOLIC PANEL WITH GFR
ALT: 12 U/L (ref 0–44)
AST: 16 U/L (ref 15–41)
Albumin: 4.2 g/dL (ref 3.5–5.0)
Alkaline Phosphatase: 52 U/L (ref 38–126)
Anion gap: 12 (ref 5–15)
BUN: 16 mg/dL (ref 6–20)
CO2: 22 mmol/L (ref 22–32)
Calcium: 9.9 mg/dL (ref 8.9–10.3)
Chloride: 101 mmol/L (ref 98–111)
Creatinine, Ser: 0.77 mg/dL (ref 0.44–1.00)
GFR, Estimated: >60 mL/min (ref >60)
Glucose, Bld: 91 mg/dL (ref 70–99)
Potassium: 3.7 mmol/L (ref 3.5–5.1)
Sodium: 136 mmol/L (ref 135–145)
Total Bilirubin: 0.5 mg/dL (ref 0.0–1.2)
Total Protein: 7.4 g/dL (ref 6.5–8.1)

## 2023-10-09 NOTE — Progress Notes (Signed)
 For Anesthesia: PCP - [name], MD  Cardiologist - N/A  Bowel Prep reminder:  Chest x-ray - 08/28/23 EKG - 08/28/23 Stress Test -  ECHO -  Cardiac Cath -  Pacemaker/ICD device last checked: Pacemaker orders received: Device Rep notified:  Spinal Cord Stimulator:N/A  Sleep Study - Yes CPAP - Yes  Fasting Blood Sugar - N/A Checks Blood Sugar _____ times a day Date and result of last Hgb A1c-  Last dose of GLP1 agonist- N/A GLP1 instructions: Hold 7 days prior to schedule (Hold 24 hours-daily)   Last dose of SGLT-2 inhibitors- N/A SGLT-2 instructions: Hold 72 hours prior to surgery  Blood Thinner Instructions:N/A Aspirin Instructions: Last Dose:  Activity level: Can go up a flight of stairs and activities of daily living without stopping and without chest pain and/or shortness of breath   Able to exercise without chest pain and/or shortness of breath  Anesthesia review: Hx: HTN,Pre-DIA,ADHD,OSA(CPAP).  Patient denies shortness of breath, fever, cough and chest pain at PAT appointment   Patient verbalized understanding of instructions that were reviewed over the telephone.

## 2023-10-15 ENCOUNTER — Other Ambulatory Visit: Payer: Self-pay

## 2023-10-15 ENCOUNTER — Encounter (HOSPITAL_COMMUNITY): Payer: Self-pay | Admitting: General Surgery

## 2023-10-15 ENCOUNTER — Ambulatory Visit (HOSPITAL_BASED_OUTPATIENT_CLINIC_OR_DEPARTMENT_OTHER): Admitting: Anesthesiology

## 2023-10-15 ENCOUNTER — Encounter (HOSPITAL_COMMUNITY)
Admission: RE | Disposition: A | Discharge status: Home / Self Care | Payer: Self-pay | Source: Home / Self Care | Attending: General Surgery

## 2023-10-15 ENCOUNTER — Ambulatory Visit (HOSPITAL_COMMUNITY): Admitting: Anesthesiology

## 2023-10-15 ENCOUNTER — Observation Stay (HOSPITAL_COMMUNITY)
Admission: RE | Admit: 2023-10-15 | Discharge: 2023-10-16 | Disposition: A | Discharge status: Home / Self Care | Attending: General Surgery | Admitting: General Surgery

## 2023-10-15 DIAGNOSIS — J45909 Unspecified asthma, uncomplicated: Secondary | ICD-10-CM | POA: Diagnosis not present

## 2023-10-15 DIAGNOSIS — Z91040 Latex allergy status: Secondary | ICD-10-CM | POA: Diagnosis not present

## 2023-10-15 DIAGNOSIS — K589 Irritable bowel syndrome without diarrhea: Secondary | ICD-10-CM | POA: Diagnosis not present

## 2023-10-15 DIAGNOSIS — Z6841 Body Mass Index (BMI) 40.0 and over, adult: Secondary | ICD-10-CM

## 2023-10-15 DIAGNOSIS — I10 Essential (primary) hypertension: Secondary | ICD-10-CM

## 2023-10-15 DIAGNOSIS — F418 Other specified anxiety disorders: Secondary | ICD-10-CM | POA: Diagnosis not present

## 2023-10-15 DIAGNOSIS — K219 Gastro-esophageal reflux disease without esophagitis: Secondary | ICD-10-CM | POA: Insufficient documentation

## 2023-10-15 HISTORY — PX: UPPER GI ENDOSCOPY: SHX6162

## 2023-10-15 HISTORY — PX: LAPAROSCOPIC GASTRIC SLEEVE RESECTION: SHX5895

## 2023-10-15 LAB — CBC
HCT: 41.6 % (ref 36.0–46.0)
Hemoglobin: 14 g/dL (ref 12.0–15.0)
MCH: 31.3 pg (ref 26.0–34.0)
MCHC: 33.7 g/dL (ref 30.0–36.0)
MCV: 93.1 fL (ref 80.0–100.0)
Platelets: 238 K/uL (ref 150–400)
RBC: 4.47 MIL/uL (ref 3.87–5.11)
RDW: 12.3 % (ref 11.5–15.5)
WBC: 13.7 K/uL — ABNORMAL HIGH (ref 4.0–10.5)
nRBC: 0 % (ref 0.0–0.2)

## 2023-10-15 LAB — TYPE AND SCREEN
ABO/RH(D): A POS
Antibody Screen: NEGATIVE

## 2023-10-15 LAB — HEMOGLOBIN AND HEMATOCRIT, BLOOD
HCT: 45.7 % (ref 36.0–46.0)
Hemoglobin: 15 g/dL (ref 12.0–15.0)

## 2023-10-15 LAB — GLUCOSE, CAPILLARY
Glucose-Capillary: 236 mg/dL — ABNORMAL HIGH (ref 70–99)
Glucose-Capillary: 200 mg/dL — ABNORMAL HIGH (ref 70–99)
Glucose-Capillary: 178 mg/dL — ABNORMAL HIGH (ref 70–99)

## 2023-10-15 LAB — CREATININE, SERUM
Creatinine, Ser: 1.01 mg/dL — ABNORMAL HIGH (ref 0.44–1.00)
GFR, Estimated: >60 mL/min (ref >60)

## 2023-10-15 LAB — HEMOGLOBIN A1C
Hgb A1c MFr Bld: 5.4 % (ref 4.8–5.6)
Mean Plasma Glucose: 108.28 mg/dL

## 2023-10-15 SURGERY — GASTRECTOMY, SLEEVE, LAPAROSCOPIC
Anesthesia: General

## 2023-10-15 MED ORDER — SCOPOLAMINE 1 MG/3DAYS TD PT72
1.0000 | MEDICATED_PATCH | TRANSDERMAL | Status: DC
  Administered 2023-10-15: 1 mg via TRANSDERMAL
  Filled 2023-10-15: qty 1

## 2023-10-15 MED ORDER — LACTATED RINGERS IV SOLN: INTRAVENOUS | Status: DC

## 2023-10-15 MED ORDER — CHLORHEXIDINE GLUCONATE CLOTH 2 % EX PADS: 6.0000 | MEDICATED_PAD | Freq: Once | CUTANEOUS | Status: DC

## 2023-10-15 MED ORDER — EPHEDRINE SULFATE (PRESSORS) 50 MG/ML IJ SOLN
INTRAMUSCULAR | Status: DC | PRN
  Administered 2023-10-15: 5 mg via INTRAVENOUS

## 2023-10-15 MED ORDER — ONDANSETRON HCL 4 MG/2ML IJ SOLN
INTRAMUSCULAR | Status: DC | PRN
  Administered 2023-10-15: 4 mg via INTRAVENOUS

## 2023-10-15 MED ORDER — HYDRALAZINE HCL 20 MG/ML IJ SOLN: 10.0000 mg | INTRAMUSCULAR | Status: DC | PRN

## 2023-10-15 MED ORDER — HYDROMORPHONE HCL 1 MG/ML IJ SOLN
0.5000 mg | INTRAMUSCULAR | Status: DC | PRN
  Administered 2023-10-15 – 2023-10-16 (×2): 0.5 mg via INTRAVENOUS
  Filled 2023-10-15 (×2): qty 0.5

## 2023-10-15 MED ORDER — KETAMINE HCL 50 MG/5ML IJ SOSY
PREFILLED_SYRINGE | INTRAMUSCULAR | Status: DC | PRN
Start: 2023-10-15 — End: 2023-10-15
  Administered 2023-10-15: 30 mg via INTRAVENOUS

## 2023-10-15 MED ORDER — ONDANSETRON HCL 4 MG/2ML IJ SOLN: 4.0000 mg | Freq: Once | INTRAMUSCULAR | Status: DC | PRN

## 2023-10-15 MED ORDER — LIDOCAINE HCL (PF) 2 % IJ SOLN
INTRAMUSCULAR | Status: AC
  Filled 2023-10-15: qty 10

## 2023-10-15 MED ORDER — OXYCODONE HCL 5 MG/5ML PO SOLN
5.0000 mg | Freq: Four times a day (QID) | ORAL | Status: DC | PRN
  Administered 2023-10-15: 5 mg via ORAL
  Filled 2023-10-15: qty 5

## 2023-10-15 MED ORDER — ENSURE MAX PROTEIN PO LIQD
2.0000 [oz_av] | ORAL | Status: DC
  Administered 2023-10-16 (×2): 2 [oz_av] via ORAL

## 2023-10-15 MED ORDER — FENTANYL CITRATE (PF) 100 MCG/2ML IJ SOLN
INTRAMUSCULAR | Status: DC | PRN
  Administered 2023-10-15: 100 ug via INTRAVENOUS
  Administered 2023-10-15: 50 ug via INTRAVENOUS

## 2023-10-15 MED ORDER — LACTATED RINGERS IV SOLN: INTRAVENOUS | Status: DC | PRN

## 2023-10-15 MED ORDER — ALBUTEROL SULFATE HFA 108 (90 BASE) MCG/ACT IN AERS: 2.0000 | INHALATION_SPRAY | Freq: Four times a day (QID) | RESPIRATORY_TRACT | Status: DC | PRN

## 2023-10-15 MED ORDER — ACETAMINOPHEN 500 MG PO TABS
1000.0000 mg | ORAL_TABLET | Freq: Three times a day (TID) | ORAL | Status: DC
  Administered 2023-10-15 – 2023-10-16 (×2): 1000 mg via ORAL
  Filled 2023-10-15 (×2): qty 2

## 2023-10-15 MED ORDER — GABAPENTIN 100 MG PO CAPS
200.0000 mg | ORAL_CAPSULE | Freq: Two times a day (BID) | ORAL | Status: DC
  Administered 2023-10-15 – 2023-10-16 (×2): 200 mg via ORAL
  Filled 2023-10-15 (×2): qty 2

## 2023-10-15 MED ORDER — ORAL CARE MOUTH RINSE: 15.0000 mL | Freq: Once | OROMUCOSAL | Status: DC

## 2023-10-15 MED ORDER — DEXTROSE-SODIUM CHLORIDE 5-0.45 % IV SOLN: INTRAVENOUS | Status: DC

## 2023-10-15 MED ORDER — ROCURONIUM BROMIDE 10 MG/ML (PF) SYRINGE
PREFILLED_SYRINGE | INTRAVENOUS | Status: AC
  Filled 2023-10-15: qty 10

## 2023-10-15 MED ORDER — FAMOTIDINE IN NACL 20-0.9 MG/50ML-% IV SOLN
20.0000 mg | Freq: Two times a day (BID) | INTRAVENOUS | Status: DC
  Administered 2023-10-15 – 2023-10-16 (×3): 20 mg via INTRAVENOUS
  Filled 2023-10-15 (×3): qty 50

## 2023-10-15 MED ORDER — OXYCODONE HCL 5 MG/5ML PO SOLN: 5.0000 mg | Freq: Once | ORAL | Status: DC | PRN

## 2023-10-15 MED ORDER — FENTANYL CITRATE PF 50 MCG/ML IJ SOSY
PREFILLED_SYRINGE | INTRAMUSCULAR | Status: AC
  Filled 2023-10-15: qty 3

## 2023-10-15 MED ORDER — SUGAMMADEX SODIUM 200 MG/2ML IV SOLN
INTRAVENOUS | Status: DC | PRN
  Administered 2023-10-15: 100 mg via INTRAVENOUS
  Administered 2023-10-15: 200 mg via INTRAVENOUS

## 2023-10-15 MED ORDER — PROPOFOL 10 MG/ML IV BOLUS
INTRAVENOUS | Status: AC
  Filled 2023-10-15: qty 20

## 2023-10-15 MED ORDER — ROCURONIUM BROMIDE 100 MG/10ML IV SOLN
INTRAVENOUS | Status: DC | PRN
  Administered 2023-10-15: 80 mg via INTRAVENOUS
  Administered 2023-10-15: 20 mg via INTRAVENOUS

## 2023-10-15 MED ORDER — INSULIN ASPART 100 UNIT/ML IJ SOLN
0.0000 [IU] | INTRAMUSCULAR | Status: DC
  Administered 2023-10-15: 5 [IU] via SUBCUTANEOUS
  Administered 2023-10-15 (×2): 3 [IU] via SUBCUTANEOUS
  Administered 2023-10-16 (×2): 2 [IU] via SUBCUTANEOUS

## 2023-10-15 MED ORDER — LORATADINE 10 MG PO TABS
10.0000 mg | ORAL_TABLET | Freq: Every evening | ORAL | Status: DC
  Administered 2023-10-15: 10 mg via ORAL
  Filled 2023-10-15: qty 1

## 2023-10-15 MED ORDER — PROPOFOL 10 MG/ML IV BOLUS
INTRAVENOUS | Status: DC | PRN
  Administered 2023-10-15: 200 mg via INTRAVENOUS

## 2023-10-15 MED ORDER — LACTATED RINGERS IR SOLN
Status: DC | PRN
  Administered 2023-10-15: 1000 mL

## 2023-10-15 MED ORDER — FENTANYL CITRATE PF 50 MCG/ML IJ SOSY
25.0000 ug | PREFILLED_SYRINGE | INTRAMUSCULAR | Status: DC | PRN
  Administered 2023-10-15 (×3): 50 ug via INTRAVENOUS

## 2023-10-15 MED ORDER — ACETAMINOPHEN 10 MG/ML IV SOLN: 1000.0000 mg | Freq: Once | INTRAVENOUS | Status: DC | PRN

## 2023-10-15 MED ORDER — MIDAZOLAM HCL 5 MG/5ML IJ SOLN
INTRAMUSCULAR | Status: DC | PRN
  Administered 2023-10-15: 2 mg via INTRAVENOUS

## 2023-10-15 MED ORDER — APREPITANT 40 MG PO CAPS
40.0000 mg | ORAL_CAPSULE | ORAL | Status: AC
Start: 2023-10-15 — End: 2023-10-15
  Administered 2023-10-15: 40 mg via ORAL
  Filled 2023-10-15: qty 1

## 2023-10-15 MED ORDER — FLUTICASONE PROPIONATE 50 MCG/ACT NA SUSP: 2.0000 | Freq: Every day | NASAL | Status: DC | PRN

## 2023-10-15 MED ORDER — FENTANYL CITRATE (PF) 250 MCG/5ML IJ SOLN
INTRAMUSCULAR | Status: AC
  Filled 2023-10-15: qty 5

## 2023-10-15 MED ORDER — HEPARIN SODIUM (PORCINE) 5000 UNIT/ML IJ SOLN
5000.0000 [IU] | INTRAMUSCULAR | Status: AC
  Administered 2023-10-15: 5000 [IU] via SUBCUTANEOUS
  Filled 2023-10-15: qty 1

## 2023-10-15 MED ORDER — PHENYLEPHRINE 80 MCG/ML (10ML) SYRINGE FOR IV PUSH (FOR BLOOD PRESSURE SUPPORT)
PREFILLED_SYRINGE | INTRAVENOUS | Status: DC | PRN
  Administered 2023-10-15: 80 ug via INTRAVENOUS

## 2023-10-15 MED ORDER — ONDANSETRON HCL 4 MG/2ML IJ SOLN
4.0000 mg | INTRAMUSCULAR | Status: DC | PRN
  Administered 2023-10-15 (×2): 4 mg via INTRAVENOUS
  Filled 2023-10-15 (×2): qty 2

## 2023-10-15 MED ORDER — PHENYLEPHRINE 80 MCG/ML (10ML) SYRINGE FOR IV PUSH (FOR BLOOD PRESSURE SUPPORT)
PREFILLED_SYRINGE | INTRAVENOUS | Status: AC
  Filled 2023-10-15: qty 10

## 2023-10-15 MED ORDER — SODIUM CHLORIDE 0.9 % IV SOLN
2.0000 g | INTRAVENOUS | Status: AC
  Administered 2023-10-15: 2 g via INTRAVENOUS
  Filled 2023-10-15: qty 2

## 2023-10-15 MED ORDER — ACETAMINOPHEN 160 MG/5ML PO SOLN: 1000.0000 mg | Freq: Three times a day (TID) | ORAL | Status: DC

## 2023-10-15 MED ORDER — DEXMEDETOMIDINE HCL IN NACL 80 MCG/20ML IV SOLN
INTRAVENOUS | Status: DC | PRN
  Administered 2023-10-15: 8 ug via INTRAVENOUS

## 2023-10-15 MED ORDER — HEPARIN SODIUM (PORCINE) 5000 UNIT/ML IJ SOLN
5000.0000 [IU] | Freq: Three times a day (TID) | INTRAMUSCULAR | Status: DC
  Administered 2023-10-15 – 2023-10-16 (×2): 5000 [IU] via SUBCUTANEOUS
  Filled 2023-10-15 (×2): qty 1

## 2023-10-15 MED ORDER — LIDOCAINE HCL (CARDIAC) PF 100 MG/5ML IV SOSY
PREFILLED_SYRINGE | INTRAVENOUS | Status: DC | PRN
  Administered 2023-10-15: 100 mg via INTRAVENOUS

## 2023-10-15 MED ORDER — LEVOCETIRIZINE DIHYDROCHLORIDE 5 MG PO TABS
5.0000 mg | ORAL_TABLET | Freq: Every evening | ORAL | Status: DC
Start: 2023-10-15 — End: 2023-10-15

## 2023-10-15 MED ORDER — DEXAMETHASONE SODIUM PHOSPHATE 10 MG/ML IJ SOLN
4.0000 mg | INTRAMUSCULAR | Status: AC
  Administered 2023-10-15: 5 mg via INTRAVENOUS

## 2023-10-15 MED ORDER — ALBUTEROL SULFATE (2.5 MG/3ML) 0.083% IN NEBU: 2.5000 mg | INHALATION_SOLUTION | Freq: Four times a day (QID) | RESPIRATORY_TRACT | Status: DC | PRN

## 2023-10-15 MED ORDER — SIMETHICONE 80 MG PO CHEW
80.0000 mg | CHEWABLE_TABLET | Freq: Four times a day (QID) | ORAL | Status: DC | PRN
  Administered 2023-10-15: 80 mg via ORAL
  Filled 2023-10-15: qty 1

## 2023-10-15 MED ORDER — CHLORHEXIDINE GLUCONATE 0.12 % MT SOLN: 15.0000 mL | Freq: Once | OROMUCOSAL | Status: DC

## 2023-10-15 MED ORDER — ACETAMINOPHEN 500 MG PO TABS
1000.0000 mg | ORAL_TABLET | ORAL | Status: AC
  Administered 2023-10-15: 1000 mg via ORAL
  Filled 2023-10-15: qty 2

## 2023-10-15 MED ORDER — BUPIVACAINE LIPOSOME 1.3 % IJ SUSP: 20.0000 mL | Freq: Once | INTRAMUSCULAR | Status: DC

## 2023-10-15 MED ORDER — BUPIVACAINE-EPINEPHRINE (PF) 0.25% -1:200000 IJ SOLN
INTRAMUSCULAR | Status: AC
  Filled 2023-10-15: qty 60

## 2023-10-15 MED ORDER — 0.9 % SODIUM CHLORIDE (POUR BTL) OPTIME
TOPICAL | Status: DC | PRN
  Administered 2023-10-15: 1000 mL

## 2023-10-15 MED ORDER — KETAMINE HCL 50 MG/5ML IJ SOSY
PREFILLED_SYRINGE | INTRAMUSCULAR | Status: AC
  Filled 2023-10-15: qty 5

## 2023-10-15 MED ORDER — BUPIVACAINE HCL 0.25 % IJ SOLN
INTRAMUSCULAR | Status: DC | PRN
  Administered 2023-10-15: 60 mL

## 2023-10-15 MED ORDER — MIDAZOLAM HCL 2 MG/2ML IJ SOLN
INTRAMUSCULAR | Status: AC
  Filled 2023-10-15: qty 2

## 2023-10-15 MED ORDER — OXYCODONE HCL 5 MG PO TABS: 5.0000 mg | ORAL_TABLET | Freq: Once | ORAL | Status: DC | PRN

## 2023-10-15 SURGICAL SUPPLY — 45 items
BAG COUNTER SPONGE SURGICOUNT (BAG) IMPLANT
BAG LAPAROSCOPIC 12 15 PORT 16 (BASKET) ×1 IMPLANT
BENZOIN TINCTURE PRP APPL 2/3 (GAUZE/BANDAGES/DRESSINGS) ×1 IMPLANT
BLADE SURG SZ11 CARB STEEL (BLADE) ×1 IMPLANT
BNDG ADH 1X3 SHEER STRL LF (GAUZE/BANDAGES/DRESSINGS) ×6 IMPLANT
CABLE HIGH FREQUENCY MONO STRZ (ELECTRODE) IMPLANT
CHLORAPREP W/TINT 26 (MISCELLANEOUS) ×1 IMPLANT
CLIP APPLIE ROT 13.4 12 LRG (CLIP) IMPLANT
COVER SURGICAL LIGHT HANDLE (MISCELLANEOUS) ×1 IMPLANT
DRAPE UTILITY XL STRL (DRAPES) ×2 IMPLANT
ELECT REM PT RETURN 15FT ADLT (MISCELLANEOUS) ×1 IMPLANT
GAUZE 4X4 16PLY ~~LOC~~+RFID DBL (SPONGE) ×1 IMPLANT
GLOVE BIOGEL PI IND STRL 7.0 (GLOVE) ×1 IMPLANT
GLOVE SURG SS PI 7.0 STRL IVOR (GLOVE) ×1 IMPLANT
GOWN STRL REUS W/ TWL LRG LVL3 (GOWN DISPOSABLE) ×1 IMPLANT
GRASPER SUT TROCAR 14GX15 (MISCELLANEOUS) ×1 IMPLANT
IRRIGATION SUCT STRKRFLW 2 WTP (MISCELLANEOUS) ×1 IMPLANT
KIT BASIN OR (CUSTOM PROCEDURE TRAY) ×1 IMPLANT
KIT TURNOVER KIT A (KITS) ×1 IMPLANT
MARKER SKIN DUAL TIP RULER LAB (MISCELLANEOUS) IMPLANT
MAT PREVALON FULL STRYKER (MISCELLANEOUS) IMPLANT
NDL SPNL 22GX3.5 QUINCKE BK (NEEDLE) ×1 IMPLANT
NEEDLE SPNL 22GX3.5 QUINCKE BK (NEEDLE) ×1 IMPLANT
PACK UNIVERSAL I (CUSTOM PROCEDURE TRAY) ×1 IMPLANT
RELOAD STAPLE 60 3.6 BLU REG (STAPLE) IMPLANT
RELOAD STAPLE 60 3.8 GOLD REG (STAPLE) IMPLANT
RELOAD STAPLE 60 4.1 GRN THCK (STAPLE) IMPLANT
SCISSORS LAP 5X45 EPIX DISP (ENDOMECHANICALS) IMPLANT
SET TUBE SMOKE EVAC HIGH FLOW (TUBING) ×1 IMPLANT
SHEARS HARMONIC 45 ACE (MISCELLANEOUS) ×1 IMPLANT
SLEEVE GASTRECTOMY 40FR VISIGI (MISCELLANEOUS) ×1 IMPLANT
SLEEVE Z-THREAD 5X100MM (TROCAR) ×3 IMPLANT
SOLUTION ANTFG W/FOAM PAD STRL (MISCELLANEOUS) ×1 IMPLANT
SPIKE FLUID TRANSFER (MISCELLANEOUS) ×1 IMPLANT
STAPLER ECHELON LONG 3000 60 (ENDOMECHANICALS) ×1 IMPLANT
STRIP CLOSURE SKIN 1/2X4 (GAUZE/BANDAGES/DRESSINGS) ×1 IMPLANT
SUT ETHIBOND 0 36 GRN (SUTURE) IMPLANT
SUT MNCRL AB 4-0 PS2 18 (SUTURE) ×1 IMPLANT
SUT VICRYL 0 TIES 12 18 (SUTURE) ×1 IMPLANT
SYR 20ML LL LF (SYRINGE) ×1 IMPLANT
SYR 50ML LL SCALE MARK (SYRINGE) ×1 IMPLANT
SYSTEM KII OPTICAL ACCESS 15MM (TROCAR) ×1 IMPLANT
TOWEL OR 17X26 10 PK STRL BLUE (TOWEL DISPOSABLE) ×1 IMPLANT
TROCAR Z-THREAD OPTICAL 5X100M (TROCAR) ×1 IMPLANT
TUBING CONNECTING 10 (TUBING) ×2 IMPLANT

## 2023-10-15 NOTE — Anesthesia Procedure Notes (Signed)
 Procedure Name: Intubation Date/Time: 10/15/2023 7:41 AM  Performed by: Dartha Meckel, CRNAPre-anesthesia Checklist: Patient identified, Emergency Drugs available, Suction available and Patient being monitored Patient Re-evaluated:Patient Re-evaluated prior to induction Oxygen Delivery Method: Circle system utilized Preoxygenation: Pre-oxygenation with 100% oxygen Induction Type: IV induction Ventilation: Mask ventilation without difficulty Tube type: Oral Tube size: 7.0 mm Number of attempts: 1 Airway Equipment and Method: Stylet and Oral airway Placement Confirmation: ETT inserted through vocal cords under direct vision, positive ETCO2 and breath sounds checked- equal and bilateral Secured at: 22 cm Tube secured with: Tape Dental Injury: Teeth and Oropharynx as per pre-operative assessment

## 2023-10-15 NOTE — Progress Notes (Signed)
 Pt stated she would call if she decided she wanted to wear cpap tonight.

## 2023-10-15 NOTE — Transfer of Care (Signed)
 Immediate Anesthesia Transfer of Care Note  Patient: Pamela [name]  Procedure(s) Performed: GASTRECTOMY, SLEEVE, LAPAROSCOPIC ENDOSCOPY, UPPER GI TRACT  Patient Location: PACU  Anesthesia Type:General  Level of Consciousness: awake and alert   Airway & Oxygen Therapy: Patient Spontanous Breathing and Patient connected to face mask oxygen  Post-op Assessment: Report given to RN and Post -op Vital signs reviewed and stable  Post vital signs: Reviewed and stable  Last Vitals:  Vitals Value Taken Time  BP 162/84 10/15/23 09:18  Temp    Pulse 76 10/15/23 09:20  Resp 20 10/15/23 09:20  SpO2 97 % 10/15/23 09:20  Vitals shown include unfiled device data.  Last Pain:  Vitals:   10/15/23 0604  TempSrc:   PainSc: 0-No pain         Complications: No notable events documented.

## 2023-10-15 NOTE — Anesthesia Preprocedure Evaluation (Signed)
 Anesthesia Evaluation  Patient identified by MRN, date of birth, ID band Patient awake    Reviewed: Allergy & Precautions, NPO status , Patient's Chart, lab work & pertinent test results, reviewed documented beta blocker date and time   History of Anesthesia Complications Negative for: history of anesthetic complications  Airway Mallampati: I  TM Distance: >3 FB     Dental no notable dental hx.    Pulmonary asthma , sleep apnea    breath sounds clear to auscultation       Cardiovascular Exercise Tolerance: Good hypertension,  Rhythm:Regular Rate:Normal     Neuro/Psych neg Seizures PSYCHIATRIC DISORDERS Anxiety Depression       GI/Hepatic ,GERD  Medicated and Controlled,,(+) Hepatitis -  Endo/Other    Class 4 obesity  Renal/GU Renal disease     Musculoskeletal  (+) Arthritis ,    Abdominal   Peds  Hematology   Anesthesia Other Findings   Reproductive/Obstetrics                              Anesthesia Physical Anesthesia Plan  ASA: 3  Anesthesia Plan: General   Post-op Pain Management:    Induction: Intravenous  PONV Risk Score and Plan: 3 and Ondansetron , Dexamethasone  and Scopolamine  patch - Pre-op  Airway Management Planned: Oral ETT  Additional Equipment:   Intra-op Plan:   Post-operative Plan: Extubation in OR  Informed Consent: I have reviewed the patients History and Physical, chart, labs and discussed the procedure including the risks, benefits and alternatives for the proposed anesthesia with the patient or authorized representative who has indicated his/her understanding and acceptance.     Dental advisory given  Plan Discussed with: CRNA  Anesthesia Plan Comments:          Anesthesia Quick Evaluation

## 2023-10-15 NOTE — Discharge Instructions (Signed)

## 2023-10-15 NOTE — Op Note (Signed)
 Preop Diagnosis:   Postop Diagnosis: same  Procedure performed: laparoscopic Sleeve Gastrectomy  Assitant: Deward Foy  Indications:  The patient is a 53 y.o. year-old morbidly obese female who has been followed in the Bariatric Clinic as an outpatient. This patient was diagnosed with morbid obesity with a BMI of Body mass index is 46.79 kg/m. and significant co-morbidities including hypertension, non-insulin  dependent diabetes, and GERD.  The patient was counseled extensively in the Bariatric Outpatient Clinic and after a thorough explanation of the risks and benefits of surgery (including death from complications, bowel leak, infection such as peritonitis and/or sepsis, internal hernia, bleeding, need for blood transfusion, bowel obstruction, organ failure, pulmonary embolus, deep venous thrombosis, wound infection, incisional hernia, skin breakdown, and others entailed on the consent form) and after a compliant diet and exercise program, the patient was scheduled for an elective laparoscopic sleeve gastrectomy.  Description of Operation:  Following informed consent, the patient was taken to the operating room and placed on the operating table in the supine position.  She had previously received prophylactic antibiotics and subcutaneous heparin  for DVT prophylaxis in the pre-op holding area.  After induction of general endotracheal anesthesia by the anesthesiologist, the patient underwent placement of sequential compression devices and an oro-gastric tube.  A timeout was confirmed by the surgery and anesthesia teams.  The patient was adequately padded at all pressure points and placed on a footboard to prevent slippage from the OR table during extremes of position during surgery.  She underwent a routine sterile prep and drape of her entire abdomen.    Next, A transverse incision was made under the left subcostal area and a 5mm optical viewing trocar was introduced into the peritoneal cavity.  Pneumoperitoneum was applied with a high flow and low pressure. A laparoscope was inserted to confirm placement. A extraperitoneal block was then placed at the lateral abdominal wall using exparel  diluted with marcaine . 5 additional incisions were placed: 1 5mm trocar to the left of the midline. 1 additional 5mm trocar in the left lateral area, 1 12mm trocar in the right mid abdomen, 1 5mm trocar in the right subcostal area, and a Nathanson retractor was placed through a subxiphoid incision.  Next, a hole was created through the lesser omentum along the greater curve of the stomach to enter the lesser sac. The vessels along the greater omentum were  Then ligated and divided using the Harmonic scalpel moving towards the spleen and then short gastric vessels were ligated and divided in the same fashion to fully mobilize the fundus. The left crus was identified to ensure completion of the dissection. Next the antrum was measured and dissection continued inferiorly along the greater curve towards the pylorus and stopped 6cm from the pylorus.   A 40Fr ViSiGi dilator was placed into the esophgaus and along the lesser curve of the stomach and placed on suction.  1 60mm Gold load echelon stapler(s) followed by 4 60mm blue load echelon stapler(s) were used to make the resection along the antrum being sure to stay well away from the angularis by angling the jaws of the stapler towards the greater curve and later completing the resection staying along the ViSiGi and ensuring the fundus was not retained by appropriately retracting it lateral. Air was inserted through the ViSiGi to perform a leak test showing no bubbles and a neutral lie of the stomach.  The assistant then went and performed an upper endoscopy and leak test. No bubbles were seen and the sleeve and  antrum distended appropriately. The specimen was then placed in an endocatch bag and removed by the 15mm port.  There was bleeding on the staple line and clips  were used in 3 areas for hemostasis. Hemostasis was ensured. The fascia of the 15mm port was closed with a 0 vicryl by suture passer. Pneumoperitoneum was evacuated, all ports were removed and all incisions closed with 4-0 monocryl suture in subcuticular fashion. Steristrips and bandaids were put in place for dressing. The patient awoke from anesthesia and was brought to pacu in stable condition. All counts were correct.  Estimated blood loss: <53ml  Specimens:  Sleeve gastrectomy  Local Anesthesia: 60 ml Marcaine  Post-Op Plan:       Pain Management: PO, prn      Antibiotics: Prophylactic      Anticoagulation: Prophylactic, Starting now      Post Op Studies/Consults: Not applicable      Intended Discharge: within 48h      Intended Outpatient Follow-Up: Two Week      Intended Outpatient Studies: Not Applicable      Other: Not Applicable   Pamela [name] Pamela [name]

## 2023-10-15 NOTE — H&P (Signed)
 Chief Complaint  Patient presents with  RETURN WEIGHT LOSS   Subjective   Pamela [name] is a 53 y.o. female established patient in today for: History of Present Illness Pamela Zamor is a 53 year old female who presents for a consultation regarding gastric sleeve surgery. She was referred by Dr. Signe for evaluation of gastric sleeve surgery.  She is concerned about potential increased reflux symptoms following gastric sleeve surgery and is currently taking pantoprazole . She is preparing for the surgery with the assistance of a nutritionist and has a dietitian appointment scheduled for next Tuesday. Her history includes irritable bowel syndrome, which is well-managed through diet and lifestyle. Her father had colon cancer, and she has been undergoing regular colonoscopies since age 12, all of which have been clear.  There is no problem list on file for this patient.  Outpatient Medications Prior to Visit  Medication Sig Dispense Refill  buPROPion  (WELLBUTRIN  SR) 200 MG SR tablet Take 200 mg by mouth (Patient not taking: Reported on 09/26/2023)  losartan -hydroCHLOROthiazide  (HYZAAR ) 100-25 mg tablet Take 1 tablet by mouth once daily  meloxicam  (MOBIC ) 15 MG tablet Take 15 mg by mouth  pantoprazole  (PROTONIX ) 40 MG DR tablet Take 40 mg by mouth once daily  topiramate  (TOPAMAX ) 25 MG tablet Take 50 mg by mouth (Patient not taking: Reported on 09/26/2023)   No facility-administered medications prior to visit.    Objective   Vitals:  09/26/23 0954 09/26/23 0955  BP: (!) 158/89  Pulse: 100  Temp: 36.7 C (98 F)  SpO2: 99%  Weight: (!) 133.3 kg (293 lb 12.8 oz)  Height: 166.4 cm (5' 5.5)  PainSc: 0-No pain   Body mass index is 48.15 kg/m. Physical Exam Constitutional:  Appearance: Normal appearance.  HENT:  Head: Normocephalic and atraumatic.  Pulmonary:  Effort: Pulmonary effort is normal.  Musculoskeletal:  General: Normal range of motion.  Cervical back: Normal range  of motion.  Neurological:  General: No focal deficit present.  Mental Status: She is alert and oriented to person, place, and time. Mental status is at baseline.  Psychiatric:  Mood and Affect: Mood normal.  Behavior: Behavior normal.  Thought Content: Thought content normal.    I reviewed Dr. Donneta notes. I reviewed Ugi showing no hiatal hernia. I reviewed labs which were within normal limits.  Assessment/Plan:   Assessment & Plan Obesity - planned laparoscopic sleeve gastrectomy Obesity management with planned laparoscopic sleeve gastrectomy. She chose sleeve gastrectomy over gastric bypass due to concerns about potential exacerbation of IBS symptoms and family history of colon cancer. The procedure is expected to result in significant weight loss and improvement in obesity-related conditions, though there is a risk of increased GERD symptoms postoperatively. The risks of blood clots, bleeding, wound infection, hernia, dehydration, and nutritional deficiencies were discussed. - Proceed with laparoscopic sleeve gastrectomy. - Administer blood thinners perioperatively to prevent blood clots. - Monitor for dehydration and provide IV fluids if necessary. - Advise on a liquid diet post-surgery, transitioning to solid foods over time. - Prescribe bariatric-specific multivitamins post-surgery. - Monitor for potential complications such as bleeding, wound infection, and hernia. - Educate on the risk of hair loss and nail thinning due to rapid weight loss. - Advise on avoiding long trips or flights for 30 days post-surgery.  Gastroesophageal reflux disease in the context of bariatric surgery GERD management in the context of planned sleeve gastrectomy. There is a potential risk of exacerbation of GERD symptoms post-sleeve gastrectomy, with approximately 30% of patients experiencing increased  reflux. She is currently managed on pantoprazole , which can be continued postoperatively. - Continue  pantoprazole  post-surgery. - Monitor for exacerbation of GERD symptoms postoperatively.  Irritable bowel syndrome in the context of bariatric surgery IBS management in the context of planned sleeve gastrectomy. She has IBS, which is currently well-managed. Concerns about potential exacerbation of IBS symptoms influenced the decision to choose sleeve gastrectomy over gastric bypass, as bypass may worsen IBS symptoms. - Provide dietary guidance to manage IBS symptoms.  The patient meets weight loss surgery criteria. Due to the above reasons, I think minimally invasive vertical sleeve gastrectomy is the best option for the patient.   We discussed sleeve gastrectomy. We discussed the preoperative, operative and postoperative process. I explained the surgery in detail including the performance of an EGD near the end of the surgery to test for leak. We discussed the typical hospital course including a 1-2 day stay baring any complications. The patient was given educational material. I quoted the patient that most patients can lose up to 50-70% of their excess weight. We did discuss the possibility of weight regain several years after the procedure.   The risks of infection, bleeding, pain, scarring, weight regain, too little or too much weight loss, vitamin deficiencies and need for lifelong vitamin supplementation, hair loss, need for protein supplementation, leaks, stricture, reflux, food intolerance, gallstone formation, hernia, need for reoperation, need for open surgery, injury to spleen or surrounding structures, DVT's, PE, and death again discussed with the patient and the patient expressed understanding and desires to proceed with minimally invasive sleeve gastrectomy, possible open, intraoperative endoscopy.  We discussed that before and after surgery that there would be an alteration in their diet. I explained that we may put them on a diet 2 weeks before surgery. I also explained that they would be  on a liquid diet for 2 weeks after surgery. We discussed that they would have to avoid certain foods after surgery. We discussed the importance of physical activity as well as compliance with our dietary and supplement recommendations and routine follow-up.

## 2023-10-15 NOTE — Progress Notes (Signed)
 PHARMACY CONSULT FOR:  Risk Assessment for Post-Discharge VTE Following Bariatric Surgery  Procedure* Laparoscopic Sleeve Gastrectomy   Sex F  Black race Y  Age (years) 16  BMI (kg/m2) 46.79  Operation duration (minutes) 62 minutes  History of VTE requiring treatment* N  Hypercoagulable condition* N  Liver disorder* N  Pre-op venous stasis N  Pre-op functional health status Independent  Previous foregut or bariatric surg N  Post-op surgical site infection N  Transfusion intra- or post-op* N  Surgical site infection post-op N  Unplanned readmission N  Unplanned reoperation N  GI perforation/leak/obstruction* N  *specific risk factors for portomesenteric venous thrombosis  Predicted probability of 30-day post-discharge VTE:  0.46% (moderate risk) estimated using the [facility] Calculator  Other patient-specific factors to consider: N/A  Recommendation for Discharge: Enoxaparin  40 mg Pamela [name] q12h x 2 weeks post-discharge     Pamela [name] is a 53 y.o. female who underwent  a laparoscopic sleeve gastrectomy on 10/15/23   Case start: 0806 Case end: 0908   Allergies  Allergen Reactions   Molds & Smuts    Morphine And Codeine Nausea Only   Soy Allergy (Obsolete) Other (See Comments)     Sensitive ?Digestive issues   Latex Rash    Pt states she is not sure if she is allergic to latex or adhesives. Bandaids bother her if they are left on too long.   Wound Dressing Adhesive Rash    Patient Measurements: Height: 5' 5 (165.1 cm) Weight: 127.6 kg (281 lb 3.2 oz) IBW/kg (Calculated) : 57 Body mass index is 46.79 kg/m.  Recent Labs    10/15/23 1109  WBC 13.7*  HGB 14.0  HCT 41.6  PLT 238  CREATININE 1.01*   Estimated Creatinine Clearance: 86.6 mL/min (A) (by C-G formula based on SCr of 1.01 mg/dL (H)).    Past Medical History:  Diagnosis Date   ADHD    Anxiety    Arthritis    Asthma    Patient follows with [facility] . LOV 03/24/21 with FNP Arlean Mutter.As of 04/03/21, pt is using her inhaler 1 -2 times per day per pt.   Back pain    Constipation    COVID 03/09/2021   Patient took antiviral. As of 04/03/21, pt states that all symptoms are resolved.   Depression    GERD (gastroesophageal reflux disease)    Hypertension    IBS (irritable bowel syndrome)    Joint pain    Lower extremity edema    Obesity    Pre-diabetes    07/28/20 A1C 6.4 / Patient is taking Wegovy .   Sleep apnea    05/03/20 sleep study = moderate to severe sleep apnea. Wears CPAP.   Wears glasses      Medications Prior to Admission  Medication Sig Dispense Refill Last Dose/Taking   levocetirizine (XYZAL ) 5 MG tablet Take 5 mg by mouth every evening.   10/14/2023   losartan -hydrochlorothiazide  (HYZAAR ) 100-25 MG tablet Take 1 tablet by mouth daily. 90 tablet 1 10/14/2023   meloxicam  (MOBIC ) 15 MG tablet Take 1 tablet (15 mg total) by mouth daily with a meal 90 tablet 1 Past Month   metFORMIN  (GLUCOPHAGE ) 500 MG tablet Take 1 tablet (500 mg total) by mouth daily with breakfast. 30 tablet 0 10/14/2023   Misc Natural Products (DAILY HERBS IMMUNE DEFENSE PO) Take 1 tablet by mouth daily. Multi   Past Month   pantoprazole  (PROTONIX )  40 MG tablet Take 1 tablet (40 mg total) by mouth daily. 90 tablet 3 10/15/2023 Morning   topiramate  (TOPAMAX ) 50 MG tablet Take 1 tablet (50 mg total) by mouth 2 (two) times daily. 60 tablet 0 Past Week   albuterol  (VENTOLIN  HFA) 108 (90 Base) MCG/ACT inhaler Inhale 2 puffs into the lungs every 6 (six) hours as needed for wheezing or shortness of breath. 6.7 g 1 More than a month   budesonide  (PULMICORT  FLEXHALER) 180 MCG/ACT inhaler With respiratory illness or flare up, start 1 puffs twice daily for 2 weeks. (Patient not taking: Reported on 10/04/2023) 1 each 3 Not Taking   buPROPion  (WELLBUTRIN  SR) 200 MG 12 hr tablet Take 1 tablet (200 mg total) by mouth every morning. 30 tablet 0 More than a month    fluticasone  (FLONASE ) 50 MCG/ACT nasal spray Place 2 sprays into both nostrils daily as needed for allergies or rhinitis. 16 g 5 More than a month   montelukast  (SINGULAIR ) 10 MG tablet Take 1 tablet (10 mg total) by mouth at bedtime. (Patient taking differently: Take 10 mg by mouth at bedtime as needed (seasonal allergies).) 30 tablet 5 More than a month    Lacinda Moats, PharmD Clinical Pharmacist  9/9/20251:56 PM

## 2023-10-15 NOTE — Op Note (Signed)
   Patient: Pamela [name] (April 14, 1970, [id number])  Date of Surgery: 10/15/2023  Preoperative Diagnosis: MORBID OBESITY   Postoperative Diagnosis: MORBID OBESITY   Surgical Procedure: Upper Endoscopy   Surgeon: Deward Foy, MD  Anesthesiologist: Keneth Lynwood POUR, MD CRNA: Augusta Daved SAILOR, CRNA; Dartha Meckel, CRNA   Anesthesia: General   Fluids:  Total I/O In: 800 [I.V.:700; IV Piggyback:100] Out: -   Complications: None  Drains:  None  Specimen: None   Indications for Procedure: Pamela [name] is a 53 y.o. female undergoing laparoscopic sleeve gastrectomy and an EGD was requested to evaluate foregut anatomy intraoperatively.  Description of Procedure: During the procedure, I scrubbed out and obtained the Olympus endoscope. I gently placed endoscope in the patient's oropharynx and gently glided it down the esophagus without any difficulty under direct visualization.  The scope was advanced as far as the pylorus and then slowly withdrawn to inspect the foregut anatomy.  Dr. Stevie had placed saline in the upper abdomen and all staple lines were submerged to ensure no air leak. There was no evidence of bubbles. There was no evidence of intraluminal bleeding and the mucosa appeared healthy.  The lumen was widely patent without evidence of stricture.  The intraluminal insufflation was decompressed. The scope was withdrawn. The patient tolerated this portion of the procedure well. Please see Dr Roselynn operative note for details regarding the remainder of the procedure.    Deward Foy, MD General, Bariatric, & Minimally Invasive Surgery [facility], [place]

## 2023-10-15 NOTE — Anesthesia Postprocedure Evaluation (Signed)
 Anesthesia Post Note  Patient: Pamela [name]  Procedure(s) Performed: GASTRECTOMY, SLEEVE, LAPAROSCOPIC ENDOSCOPY, UPPER GI TRACT     Patient location during evaluation: PACU Anesthesia Type: General Level of consciousness: awake and alert Pain management: pain level controlled Vital Signs Assessment: post-procedure vital signs reviewed and stable Respiratory status: spontaneous breathing, nonlabored ventilation, respiratory function stable and patient connected to nasal cannula oxygen Cardiovascular status: blood pressure returned to baseline and stable Postop Assessment: no apparent nausea or vomiting Anesthetic complications: no   No notable events documented.  Last Vitals:  Vitals:   10/15/23 1200 10/15/23 1301  BP: (!) 144/65 (!) 170/76  Pulse: 70 64  Resp: 18 18  Temp: 37 C   SpO2: 100% 100%    Last Pain:  Vitals:   10/15/23 1204  TempSrc:   PainSc: 7                  [name]

## 2023-10-16 ENCOUNTER — Other Ambulatory Visit: Payer: Self-pay

## 2023-10-16 ENCOUNTER — Other Ambulatory Visit (HOSPITAL_BASED_OUTPATIENT_CLINIC_OR_DEPARTMENT_OTHER): Payer: Self-pay

## 2023-10-16 ENCOUNTER — Other Ambulatory Visit (HOSPITAL_COMMUNITY): Payer: Self-pay

## 2023-10-16 ENCOUNTER — Encounter (HOSPITAL_COMMUNITY): Payer: Self-pay | Admitting: General Surgery

## 2023-10-16 LAB — GLUCOSE, CAPILLARY
Glucose-Capillary: 112 mg/dL — ABNORMAL HIGH (ref 70–99)
Glucose-Capillary: 122 mg/dL — ABNORMAL HIGH (ref 70–99)
Glucose-Capillary: 142 mg/dL — ABNORMAL HIGH (ref 70–99)

## 2023-10-16 LAB — CBC WITH DIFFERENTIAL/PLATELET
Abs Immature Granulocytes: 0.03 K/uL (ref 0.00–0.07)
Basophils Absolute: 0 K/uL (ref 0.0–0.1)
Basophils Relative: 0 %
Eosinophils Absolute: 0 K/uL (ref 0.0–0.5)
Eosinophils Relative: 0 %
HCT: 39.2 % (ref 36.0–46.0)
Hemoglobin: 13.4 g/dL (ref 12.0–15.0)
Immature Granulocytes: 0 %
Lymphocytes Relative: 12 %
Lymphs Abs: 1.3 K/uL (ref 0.7–4.0)
MCH: 30.3 pg (ref 26.0–34.0)
MCHC: 34.2 g/dL (ref 30.0–36.0)
MCV: 88.7 fL (ref 80.0–100.0)
Monocytes Absolute: 1.2 K/uL — ABNORMAL HIGH (ref 0.1–1.0)
Monocytes Relative: 12 %
Neutro Abs: 7.9 K/uL — ABNORMAL HIGH (ref 1.7–7.7)
Neutrophils Relative %: 76 %
Platelets: 266 K/uL (ref 150–400)
RBC: 4.42 MIL/uL (ref 3.87–5.11)
RDW: 11.9 % (ref 11.5–15.5)
WBC: 10.4 K/uL (ref 4.0–10.5)
nRBC: 0 % (ref 0.0–0.2)

## 2023-10-16 LAB — SURGICAL PATHOLOGY

## 2023-10-16 MED ORDER — GABAPENTIN 100 MG PO CAPS
100.0000 mg | ORAL_CAPSULE | Freq: Two times a day (BID) | ORAL | 0 refills | Status: AC
  Filled 2023-10-16 (×2): qty 10, 5d supply, fill #0

## 2023-10-16 MED ORDER — ONDANSETRON 4 MG PO TBDP
4.0000 mg | ORAL_TABLET | Freq: Four times a day (QID) | ORAL | 0 refills | Status: AC | PRN
  Filled 2023-10-16: qty 20, 5d supply, fill #0
  Filled 2023-10-16: qty 18, 21d supply, fill #0

## 2023-10-16 MED ORDER — ENOXAPARIN SODIUM 40 MG/0.4ML IJ SOSY
40.0000 mg | PREFILLED_SYRINGE | Freq: Two times a day (BID) | INTRAMUSCULAR | 0 refills | Status: AC
  Filled 2023-10-16 (×2): qty 11.2, 14d supply, fill #0

## 2023-10-16 MED ORDER — ACETAMINOPHEN 500 MG PO TABS: 1000.0000 mg | ORAL_TABLET | Freq: Three times a day (TID) | ORAL | Status: AC

## 2023-10-16 MED ORDER — OXYCODONE HCL 5 MG PO TABS
5.0000 mg | ORAL_TABLET | Freq: Four times a day (QID) | ORAL | 0 refills | Status: AC | PRN
  Filled 2023-10-16 (×2): qty 5, 2d supply, fill #0

## 2023-10-16 NOTE — Progress Notes (Signed)

## 2023-10-16 NOTE — Discharge Summary (Signed)
 Physician Discharge Summary  Pamela [name] FMW:[id number] DOB: 1970/08/27 DOA: 10/15/2023  PCP: [name], MD  Admit date: 10/15/2023 Discharge date:  10/16/2023   Recommendations for Outpatient Follow-up:   (include homehealth, outpatient follow-up instructions, specific recommendations for PCP to follow-up on, etc.)   Follow-up Information     [name], [name], MD Follow up on 11/07/2023.   Specialty: General Surgery Why: Please arrive 15 minutes prior to your appointment at 9am with Dr. [name] information: [address] [place] [address] [phone]         [name] [place], [place] Follow up on 12/06/2023.   Specialty: General Surgery Why: Please arrive 15 minutes prior to your appointment at 9:15am with [name] on behalf of Dr. [name] information: [address] [facility] [place] [address] [phone]                Discharge Diagnoses:  Principal Problem:   Morbid (severe) obesity due to excess calories [facility])   Surgical Procedure: laparoscopic sleeve gastrectomy, upper endoscopy  Discharge Condition: Good Disposition: Home  Diet recommendation: Postoperative sleeve gastrectomy diet (liquids only)  Filed Weights   10/15/23 0556  Weight: 127.6 kg     Hospital Course:  The patient was admitted after undergoing laparoscopic sleeve gastrectomy. POD 0 she ambulated well. POD 1 she was started on the water  diet protocol and tolerated 300 ml in the first shift. Once meeting the water  amount she was advanced to bariatric protein shakes which they tolerated and were discharged home POD 1.  Treatments: surgery: laparoscopic sleeve gastrectomy  Discharge Instructions  Discharge Instructions     Ambulate hourly while awake   Complete by: As directed    Call MD for:  difficulty breathing, headache or visual disturbances   Complete by: As directed    Call MD for:   persistant dizziness or light-headedness   Complete by: As directed    Call MD for:  persistant nausea and vomiting   Complete by: As directed    Call MD for:  redness, tenderness, or signs of infection (pain, swelling, redness, odor or green/yellow discharge around incision site)   Complete by: As directed    Call MD for:  severe uncontrolled pain   Complete by: As directed    Call MD for:  temperature >101 F   Complete by: As directed    Diet bariatric full liquid   Complete by: As directed    Discharge wound care:   Complete by: As directed    Remove Bandaids tomorrow, ok to shower tomorrow. Steristrips may fall off in 1-3 weeks.   Incentive spirometry   Complete by: As directed    Perform hourly while awake      Allergies as of 10/16/2023       Reactions   Molds & Smuts    Morphine And Codeine Nausea Only   Soy Allergy (obsolete) Other (See Comments)    Sensitive ?Digestive issues   Latex Rash   Pt states she is not sure if she is allergic to latex or adhesives. Bandaids bother her if they are left on too long.   Wound Dressing Adhesive Rash        Medication List     STOP taking these medications    meloxicam  15 MG tablet Commonly known as: MOBIC        TAKE these medications    acetaminophen  500 MG tablet Commonly known as: TYLENOL   Take 2 tablets (1,000 mg total) by mouth every 8 (eight) hours for 5 days.   albuterol  108 (90 Base) MCG/ACT inhaler Commonly known as: Ventolin  HFA Inhale 2 puffs into the lungs every 6 (six) hours as needed for wheezing or shortness of breath.   buPROPion  200 MG 12 hr tablet Commonly known as: WELLBUTRIN  SR Take 1 tablet (200 mg total) by mouth every morning.   DAILY HERBS IMMUNE DEFENSE PO Take 1 tablet by mouth daily. Multi   enoxaparin  40 MG/0.4ML injection Commonly known as: LOVENOX  Inject 0.4 mLs (40 mg total) into the skin every 12 (twelve) hours for 14 days.   fluticasone  50 MCG/ACT nasal spray Commonly known  as: FLONASE  Place 2 sprays into both nostrils daily as needed for allergies or rhinitis.   gabapentin  100 MG capsule Commonly known as: NEURONTIN  Take 1 capsule (100 mg total) by mouth every 12 (twelve) hours for 5 days.   levocetirizine 5 MG tablet Commonly known as: XYZAL  Take 5 mg by mouth every evening.   losartan -hydrochlorothiazide  100-25 MG tablet Commonly known as: HYZAAR  Take 1 tablet by mouth daily.   metFORMIN  500 MG tablet Commonly known as: GLUCOPHAGE  Take 1 tablet (500 mg total) by mouth daily with breakfast.   montelukast  10 MG tablet Commonly known as: Singulair  Take 1 tablet (10 mg total) by mouth at bedtime. What changed:  when to take this reasons to take this   ondansetron  4 MG disintegrating tablet Commonly known as: ZOFRAN -ODT Take 1 tablet (4 mg total) by mouth every 6 (six) hours as needed for nausea or vomiting.   oxyCODONE  5 MG immediate release tablet Commonly known as: Oxy IR/ROXICODONE  Take 1 tablet (5 mg total) by mouth every 6 (six) hours as needed for breakthrough pain or severe pain (pain score 7-10).   pantoprazole  40 MG tablet Commonly known as: PROTONIX  Take 1 tablet (40 mg total) by mouth daily.   Pulmicort  Flexhaler 180 MCG/ACT inhaler Generic drug: budesonide  With respiratory illness or flare up, start 1 puffs twice daily for 2 weeks.   topiramate  50 MG tablet Commonly known as: Topamax  Take 1 tablet (50 mg total) by mouth 2 (two) times daily.               Discharge Care Instructions  (From admission, onward)           Start     Ordered   10/16/23 0000  Discharge wound care:       Comments: Remove Bandaids tomorrow, ok to shower tomorrow. Steristrips may fall off in 1-3 weeks.   10/16/23 1005            Follow-up Information     [name], [name], MD Follow up on 11/07/2023.   Specialty: General Surgery Why: Please arrive 15 minutes prior to your appointment at 9am with Dr. [name]  information: [address] [place] [address] [phone]         [name], [place] Follow up on 12/06/2023.   Specialty: General Surgery Why: Please arrive 15 minutes prior to your appointment at 9:15am with [name] on behalf of Dr. [name] information: [address] [facility] [place] [address] [phone]                  The results of significant diagnostics from this hospitalization (including imaging, microbiology, ancillary and laboratory) are listed below for reference.    Significant Diagnostic Studies: No results  found.  Labs: Basic Metabolic Panel: Recent Labs  Lab 10/09/23 1430 10/15/23 1109  NA 136  --   K 3.7  --   CL 101  --   CO2 22  --   GLUCOSE 91  --   BUN 16  --   CREATININE 0.77 1.01*  CALCIUM 9.9  --    Liver Function Tests: Recent Labs  Lab 10/09/23 1430  AST 16  ALT 12  ALKPHOS 52  BILITOT 0.5  PROT 7.4  ALBUMIN 4.2    CBC: Recent Labs  Lab 10/09/23 1430 10/15/23 1109 10/15/23 1333 10/16/23 0453  WBC 5.9 13.7*  --  10.4  NEUTROABS 3.0  --   --  7.9*  HGB 14.1 14.0 15.0 13.4  HCT 42.2 41.6 45.7 39.2  MCV 89.6 93.1  --  88.7  PLT 261 238  --  266    CBG: Recent Labs  Lab 10/15/23 1606 10/15/23 2001 10/16/23 0010 10/16/23 0424 10/16/23 0748  GLUCAP 200* 178* 142* 122* 112*    Principal Problem:   Morbid (severe) obesity due to excess calories [facility])   VTE plan: I will prescribe outpatient chemical prophylaxis of enoxaparin  due to this increased risk ([email or web address])  Time coordinating discharge: 15 min

## 2023-10-16 NOTE — Progress Notes (Signed)
 Discharge medications delivered to pt in room by this RN

## 2023-10-16 NOTE — Progress Notes (Addendum)
 Patient alert and oriented, pain is controlled. Patient is tolerating fluids, advanced to protein shake today, patient is tolerating well. Reviewed Gastric sleeve/bypass discharge instructions with patient and patient is able to articulate understanding. Provided information on BELT program, Support Group, BSTOP-D, and [facility] outpatient pharmacy. Communicated general update of patient status to surgeon. All questions answered. Lovenox  education provided to patient as well as reference to Lovenox  website [email or web address] for further instructions.  24hr fluid recall is 460 mL per hydration protocol, bariatric nurse coordinator to make follow-up phone call within one week.

## 2023-10-17 ENCOUNTER — Telehealth: Payer: Self-pay

## 2023-10-17 NOTE — Transitions of Care (Post Inpatient/ED Visit) (Signed)
   10/17/2023  Name: Pamela [name] MRN: [id number] DOB: 03/03/1970  Today's TOC FU Call Status: Today's TOC FU Call Status:: Unsuccessful Call (1st Attempt) Unsuccessful Call (1st Attempt) Date: 10/17/23  Attempted to reach the patient regarding the most recent Inpatient/ED visit.  Follow Up Plan: Additional outreach attempts will be made to reach the patient to complete the Transitions of Care (Post Inpatient/ED visit) call.   Signature  Slater Diesel, RN

## 2023-10-19 ENCOUNTER — Telehealth: Payer: Self-pay | Admitting: Surgery

## 2023-10-19 NOTE — Telephone Encounter (Signed)
 Pamela [name]  [birth year] [id number]  Patient Care Team: [name], MD as PCP - General (Family Medicine) Kinsinger, [name], MD as Consulting Physician (General Surgery)  Reason for call: Elevated heart rate with activity  Date of procedure/visit: 9/9/205  Surgery: Laparoscopic sleeve gastrectomy by Dr. Patrici was involved   Patient called with concerns.  She had a laparoscopic sleeve gastrectomy earlier this week and went home postop day 1.  She has been trying to do her 5-minute walks every hour and drinking liquids.  She has had episodes where her heart will race and get into the 130s to 150s when she gets up to shower or tries to her run errands or do activity.  She has been lightheaded once but not persistent.  Not worsening.  As soon as she stops her activity, heart rate comes down.  She does have hypertension but she is not on a beta-blocker.  No fevers chills or sweats.  She is keeping liquids down.  She is urinating fine.  I suspect that she is overdoing it and may be slightly dehydrated which can happen with sleeve gastrectomy's.  I recommend she decrease to much more light activity and push fluids.  Keep an eye on things.  If it worsens or she feels lightheaded, she goes to the ER to rule out atrial fibrillation or other concerns.  Make sure she is not dehydrated and needs fluids.  Call us  back on Monday for an update.  Hopefully a temporary issue.  She sounds calm and relaxed.  Not toxic or sickly.  She feels reassured and will update us  soon.  Patient Active Problem List   Diagnosis Date Noted   Morbid (severe) obesity due to excess calories (HCC) 10/15/2023   Hyperlipidemia, pure 06/11/2022   BMI 40.0-44.9, adult (HCC) 04/12/2022   Depression 03/15/2022   SOB (shortness of breath) on exertion 11/30/2021   Eating disorder 05/03/2021   Abnormal uterine bleeding (AUB) 04/12/2021   Epistaxis 03/24/2021    Other Specified Feeding or Eating Disorder, Emotional  Eating Behaviors 03/23/2021   B12 deficiency 11/14/2020   Vitamin D  deficiency 07/28/2020   Essential hypertension 07/28/2020   Mixed hyperlipidemia 07/28/2020   Low ferritin 07/28/2020   Prediabetes 07/17/2020   Class 3 severe obesity with serious comorbidity and body mass index (BMI) of 50.0 to 59.9 in adult 07/07/2020   Depressed mood, with emotional eating 05/04/2020   Family history of colon cancer 04/13/2020   Screening for malignant neoplasm of colon 04/13/2020   Anal fissure 04/13/2020   Change in bowel habit 04/13/2020   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   Chronic idiopathic constipation 04/13/2020   Constipation 04/13/2020   Irritable bowel syndrome with constipation 04/13/2020   Dysuria 04/13/2020   Family history of malignant neoplasm of gastrointestinal tract 04/13/2020   Generalized abdominal pain 04/13/2020   History of colonic polyps 04/13/2020   Morbid obesity (HCC) 04/13/2020   Rectal bleeding 04/13/2020   Urinary tract infectious disease 04/13/2020   Gastro-esophageal reflux disease without esophagitis 04/13/2020   Gastroesophageal reflux disease 03/25/2020   Perennial allergic rhinitis 08/23/2016   Mild persistent asthma, uncomplicated 08/23/2016    Past Medical History:  Diagnosis Date   ADHD    Anxiety    Arthritis    Asthma    Patient follows with [facility] . LOV 03/24/21 with FNP Arlean Mutter.As of 04/03/21, pt is using her inhaler 1 -2 times per day per pt.   Back pain  Constipation    COVID 03/09/2021   Patient took antiviral. As of 04/03/21, pt states that all symptoms are resolved.   Depression    GERD (gastroesophageal reflux disease)    Hypertension    IBS (irritable bowel syndrome)    Joint pain    Lower extremity edema    Obesity    Pre-diabetes    07/28/20 A1C 6.4 / Patient is taking Wegovy .   Sleep apnea    05/03/20 sleep study = moderate to severe sleep apnea. Wears CPAP.   Wears glasses     Past  Surgical History:  Procedure Laterality Date   CESAREAN SECTION  1998   COLONOSCOPY  08/19/2020   LAPAROSCOPIC GASTRIC SLEEVE RESECTION N/A 10/15/2023   Procedure: GASTRECTOMY, SLEEVE, LAPAROSCOPIC;  Surgeon: Kinsinger, [name], MD;  Location: [facility];  Service: General;  Laterality: N/A;   OVARIAN CYST REMOVAL  1991   ROBOTIC ASSISTED LAPAROSCOPIC HYSTERECTOMY AND SALPINGECTOMY Bilateral 04/12/2021   Procedure: XI ROBOTIC ASSISTED LAPAROSCOPIC HYSTERECTOMY AND BILATERAL SALPINGECTOMY;  Surgeon: Storm Setter, DO;  Location: [facility];  Service: Gynecology;  Laterality: Bilateral;   TUBAL LIGATION  2008   UPPER GI ENDOSCOPY N/A 10/15/2023   Procedure: ENDOSCOPY, UPPER GI TRACT;  Surgeon: Kinsinger, [name], MD;  Location: [facility];  Service: General;  Laterality: N/A;   WISDOM TOOTH EXTRACTION  1986    Social History   Socioeconomic History   Marital status: Married    Spouse name: Not on file   Number of children: Not on file   Years of education: Not on file   Highest education level: Not on file  Occupational History   Occupation: Educator   Tobacco Use   Smoking status: Never    Passive exposure: Never   Smokeless tobacco: Never  Vaping Use   Vaping status: Never Used  Substance and Sexual Activity   Alcohol use: Not Currently    Comment: occasionally, once per month   Drug use: No   Sexual activity: Yes    Birth control/protection: Surgical    Comment: tubal ligation  Other Topics Concern   Not on file  Social History Narrative   Lives at home with husband   Right handed   Caffeine: 1 cup/day   Social Drivers of Corporate investment banker Strain: Low Risk  (01/16/2023)   Overall Financial Resource Strain (CARDIA)    Difficulty of Paying Living Expenses: Not hard at all  Food Insecurity: No Food Insecurity (10/15/2023)   Hunger Vital Sign    Worried About Running Out of Food in the Last Year: Never true    Ran Out of Food in the Last Year: Never  true  Transportation Needs: No Transportation Needs (10/15/2023)   PRAPARE - Administrator, Civil Service (Medical): No    Lack of Transportation (Non-Medical): No  Physical Activity: Insufficiently Active (01/16/2023)   Exercise Vital Sign    Days of Exercise per Week: 1 day    Minutes of Exercise per Session: 30 min  Stress: No Stress Concern Present (01/16/2023)   [employer] of Occupational Health - Occupational Stress Questionnaire    Feeling of Stress : Not at all  Social Connections: Socially Integrated (01/16/2023)   Social Connection and Isolation Panel    Frequency of Communication with Friends and Family: More than three times a week    Frequency of Social Gatherings with Friends and Family: More than three times a week    Attends Religious  Services: More than 4 times per year    Active Member of Clubs or Organizations: Yes    Attends Banker Meetings: More than 4 times per year    Marital Status: Married  Catering manager Violence: Not At Risk (10/15/2023)   Humiliation, Afraid, Rape, and Kick questionnaire    Fear of Current or Ex-Partner: No    Emotionally Abused: No    Physically Abused: No    Sexually Abused: No    Family History  Problem Relation Age of Onset   Asthma Other    Heart failure Other    Hypertension Other    Hypertension Mother    Hypertension Father    Heart disease Father    Alcoholism Father     Current Outpatient Medications  Medication Sig Dispense Refill   acetaminophen  (TYLENOL ) 500 MG tablet Take 2 tablets (1,000 mg total) by mouth every 8 (eight) hours for 5 days.     albuterol  (VENTOLIN  HFA) 108 (90 Base) MCG/ACT inhaler Inhale 2 puffs into the lungs every 6 (six) hours as needed for wheezing or shortness of breath. 6.7 g 1   budesonide  (PULMICORT  FLEXHALER) 180 MCG/ACT inhaler With respiratory illness or flare up, start 1 puffs twice daily for 2 weeks. (Patient not taking: Reported on 10/04/2023) 1 each 3    buPROPion  (WELLBUTRIN  SR) 200 MG 12 hr tablet Take 1 tablet (200 mg total) by mouth every morning. 30 tablet 0   enoxaparin  (LOVENOX ) 40 MG/0.4ML injection Inject 0.4 mLs (40 mg total) into the skin every 12 (twelve) hours for 14 days. 11.2 mL 0   fluticasone  (FLONASE ) 50 MCG/ACT nasal spray Place 2 sprays into both nostrils daily as needed for allergies or rhinitis. 16 g 5   gabapentin  (NEURONTIN ) 100 MG capsule Take 1 capsule (100 mg total) by mouth every 12 (twelve) hours for 5 days. 10 capsule 0   levocetirizine (XYZAL ) 5 MG tablet Take 5 mg by mouth every evening.     losartan -hydrochlorothiazide  (HYZAAR ) 100-25 MG tablet Take 1 tablet by mouth daily. 90 tablet 1   metFORMIN  (GLUCOPHAGE ) 500 MG tablet Take 1 tablet (500 mg total) by mouth daily with breakfast. 30 tablet 0   Misc Natural Products (DAILY HERBS IMMUNE DEFENSE PO) Take 1 tablet by mouth daily. Multi     montelukast  (SINGULAIR ) 10 MG tablet Take 1 tablet (10 mg total) by mouth at bedtime. (Patient taking differently: Take 10 mg by mouth at bedtime as needed (seasonal allergies).) 30 tablet 5   ondansetron  (ZOFRAN -ODT) 4 MG disintegrating tablet Take 1 tablet (4 mg total) by mouth every 6 (six) hours as needed for nausea or vomiting. 20 tablet 0   oxyCODONE  (OXY IR/ROXICODONE ) 5 MG immediate release tablet Take 1 tablet (5 mg total) by mouth every 6 (six) hours as needed for breakthrough pain or severe pain (pain score 7-10). 5 tablet 0   pantoprazole  (PROTONIX ) 40 MG tablet Take 1 tablet (40 mg total) by mouth daily. 90 tablet 3   topiramate  (TOPAMAX ) 50 MG tablet Take 1 tablet (50 mg total) by mouth 2 (two) times daily. 60 tablet 0   No current facility-administered medications for this visit.     Allergies  Allergen Reactions   Molds & Smuts    Morphine And Codeine Nausea Only   Soy Allergy (Obsolete) Other (See Comments)     Sensitive ?Digestive issues   Latex Rash    Pt states she is not sure if she is allergic to latex  or adhesives. Bandaids bother her if they are left on too long.   Wound Dressing Adhesive Rash    LMP 03/16/2021 (Exact Date)   No results found.  Note:  This dictation was prepared with Dragon/digital dictation along with [employer]. Any transcriptional errors that result from this process are unintentional.    Elspeth KYM Schultze, MD, FACS, MASCRS Esophageal, Gastrointestinal & Colorectal Surgery Robotic and Minimally Invasive Surgery  [facility] [address] [place], [place] [address] [phone] Fax [phone] [address]  CONTACT INFORMATION: Weekday (9AM-5PM): Call [facility] main office at [phone] Weeknight (5PM-9AM) or Weekend/Holiday: Check EPIC Web Links tab & use AMION (password  [email or web address]) for General Surgery [facility] coverage  Please, DO NOT use SecureChat  (it is not reliable communication to reach operating surgeons & will lead to a delay in care).   Epic staff messaging available for outptient concerns needing 1-2 business day response.      10/19/2023  9:37 AM

## 2023-10-21 ENCOUNTER — Encounter (HOSPITAL_COMMUNITY)

## 2023-10-21 ENCOUNTER — Other Ambulatory Visit: Payer: Self-pay | Admitting: General Surgery

## 2023-10-21 NOTE — Progress Notes (Signed)
 Orders for fluid clinic entered

## 2023-10-22 ENCOUNTER — Telehealth: Payer: Self-pay

## 2023-10-22 ENCOUNTER — Ambulatory Visit (HOSPITAL_COMMUNITY)
Admission: RE | Admit: 2023-10-22 | Discharge: 2023-10-22 | Disposition: A | Discharge status: Home / Self Care | Source: Ambulatory Visit | Attending: Internal Medicine | Admitting: Internal Medicine

## 2023-10-22 DIAGNOSIS — E860 Dehydration: Secondary | ICD-10-CM | POA: Insufficient documentation

## 2023-10-22 MED ORDER — ONDANSETRON 4 MG PO TBDP: 4.0000 mg | ORAL_TABLET | ORAL | Status: DC | PRN

## 2023-10-22 MED ORDER — THIAMINE HCL 100 MG/ML IJ SOLN: Freq: Once | INTRAVENOUS | Status: DC

## 2023-10-22 MED ORDER — ONDANSETRON HCL 4 MG/2ML IJ SOLN: 4.0000 mg | INTRAMUSCULAR | Status: DC | PRN

## 2023-10-22 MED ORDER — THIAMINE HCL 100 MG/ML IJ SOLN
Freq: Once | INTRAVENOUS | Status: AC
  Filled 2023-10-22: qty 1000

## 2023-10-22 MED ORDER — SODIUM CHLORIDE 0.9 % IV BOLUS
1000.0000 mL | Freq: Once | INTRAVENOUS | Status: AC
  Administered 2023-10-22: 1000 mL via INTRAVENOUS

## 2023-10-22 NOTE — Telephone Encounter (Signed)
 She stated she is feeling so much better since she reveived the IV hydration today  She said that prior to the hydration, she was not able to walk 5 minutes without her heart racing.    She explained that she is done with the gabapentin  today but she continues to have pain on the right side of her abdomen that is very localized and feels like the area is being pinched. She said that the pain just occurs in the evening when she is getting ready to go to bed.  She said she is able to manage the pain during the day; but she  would like a refill of the gabapentin  so she could continue to take it at bedtime. She said she had the oxycodone  but did not like it  I told her that I will share this request with Dr Tanda however, she may recommend that she follow up with her surgeon.  She was very appreciative of the follow up call.

## 2023-10-22 NOTE — Transitions of Care (Post Inpatient/ED Visit) (Signed)
   10/22/2023  Name: Pamela [name] MRN: [id number] DOB: [birth year]  Today's TOC FU Call Status: Today's TOC FU Call Status:: Successful TOC FU Call Completed Unsuccessful Call (1st Attempt) Date: 10/17/23 [facility] FU Call Complete Date: 10/22/23 Patient's Name and Date of Birth confirmed.  Transition Care Management Follow-up Telephone Call Date of Discharge: 10/16/23 Discharge Facility: [facility]) Type of Discharge: Inpatient Admission Primary Inpatient Discharge Diagnosis:: s/p laproscopic gastric sleeve How have you been since you were released from the hospital?: Better (She stated she is feeling so much better since she reveived the IV hydration today  She said that prior to the hydration, she was not able to walk 5 minutes without her heart racing.) Any questions or concerns?: Yes Patient Questions/Concerns:: She explained that she is done with the gabapentin  today but she continues to ahve pain on the right side of her abdomen that is very localized and feels like the area is being pinched. She said that the pain just occurs in the evening when she is getting ready to go to bed.  She said she is able to manage the pain during the day; but she  would like a refill of the gabapentin  so she could continue to take it at bedtime. She said she had the oxycodone  but did not like it  I told her that I will share this request with Dr Tanda however, she may recommend that she follow up with her surgeon. Patient Questions/Concerns Addressed: Notified Provider of Patient Questions/Concerns  Items Reviewed: Did you receive and understand the discharge instructions provided?: Yes Medications obtained,verified, and reconciled?: Partial Review Completed Reason for Partial Mediation Review: The patient said she has all of her medications and did not have any questions about the med regime and did not need to review the med list.  She said that she is administering the lovenox  independently Any new  allergies since your discharge?: No Dietary orders reviewed?: Yes Type of Diet Ordered:: prescribed diet postsurgery.  She said she is tolerating jello, cream of chicken soup, her protein shakes and 64-100 oz of water  daily. She said thatt overall she is not expeiencing any hunger. Do you have support at home?: Yes People in Home [RPT]: spouse Name of Support/Comfort Primary Source: her husband  Medications Reviewed Today: Medications Reviewed Today   Medications were not reviewed in this encounter     Home Care and Equipment/Supplies: Were Home Health Services Ordered?: No Any new equipment or medical supplies ordered?: No  Functional Questionnaire: Do you need assistance with bathing/showering or dressing?: No Do you need assistance with meal preparation?: No Do you need assistance with eating?: No Do you have difficulty maintaining continence: No Do you need assistance with getting out of bed/getting out of a chair/moving?: No Do you have difficulty managing or taking your medications?: No  Follow up appointments reviewed: PCP Follow-up appointment confirmed?: Yes Date of PCP follow-up appointment?: 10/24/23 Follow-up Provider: Dr Tanda [facility] Follow-up appointment confirmed?: Yes Date of Specialist follow-up appointment?: 11/07/23 Follow-Up Specialty Provider:: surgeon.  10/29/2023- nutritionist Do you need transportation to your follow-up appointment?: No Do you understand care options if your condition(s) worsen?: Yes-patient verbalized understanding    SIGNATURE Slater Diesel, RN

## 2023-10-22 NOTE — Progress Notes (Signed)
 PATIENT CARE CENTER NOTE   Diagnosis:  Dehydration    Provider: Stevie Primes, MD   Procedure: IV fluid hydration    Note: Patient received 1 liter NS bolus  followed by 1 liter Banana Bag (over 2 hours) via PIV. Patient tolerated infusions well with no adverse reaction. Vital signs stable. Discharge instructions given. Patient alert, oriented and ambulatory at discharge. Discharged home with husband.

## 2023-10-23 ENCOUNTER — Telehealth (HOSPITAL_COMMUNITY): Payer: Self-pay | Admitting: *Deleted

## 2023-10-23 NOTE — Telephone Encounter (Signed)
 I called the patient and informed her that Dr Tanda will discuss the gabapentin  order at her upcoming appointment- tomorrow, 10/24/2023 and the patient said she understood

## 2023-10-23 NOTE — Telephone Encounter (Signed)
 1. Tell me about your pain and pain management?    Pt denies any pain at this time.    2. Let's talk about fluid intake. How much total fluid are you taking in?   Pt states that s/he is working to meet goal of 64 oz of fluid today. She has been able to meet her goals better this week than last week. Pt encouraged to continue to work towards meeting goal. Pt instructed to assess status and suggestions daily utilizing Hydration Action Plan on discharge folder and to call [facility] if in the red zone.   3. How much protein have you taken in the last day?     Pt states she is meeting the goal of 60g of protein each day with the protein shakes   4. Have you had nausea? Tell me about when you have experienced nausea and what you did to help?   Pt denies nausea.   5. Has the frequency or color changed with your urine?   Pt states that s/he is urinating fine with no changes in frequency or urgency.     7. Have you been passing gas? BM?   Pt states that they are having BMs.   Pt states that they have had a BM. Pt instructed to take either Miralax or MoM as instructed per Gastric Bypass/Sleeve Discharge Home Care Instructions. Pt to call surgeon's office if not able to have BM with medication.   8. If a problem or question were to arise who would you call? Do you know contact numbers for [facility], [facility], and [facility]?   Pt knows to call [facility] for surgical, [facility] for nutrition, and [facility] for non-urgent questions or concerns. Pt denies dehydration symptoms. Pt can describe s/sx of dehydration.   9. How has the walking going?   Pt states s/he is walking around and able to be active without difficulty.   10. Are you still using your incentive spirometer? If so, how often?   Pt states that s/he is doing the I.S. Pt encouraged to use incentive spirometer, at least 10x every hour while awake until s/he sees the surgeon.   11. How are your vitamins and calcium going? How are you taking them?    Pt states  that s/he is taking his/her supplements and vitamins without difficulty.    12. How has the anticoagulant Lovenox  been going?   LOVENOX : Pt states that s/he is taking the Lovenox  injections without difficulty. Reinforced education about taking injections q12h and rotating injection sites. Pt also instructed to monitor for unusual bruising and/or signs of bleeding.

## 2023-10-24 ENCOUNTER — Other Ambulatory Visit (HOSPITAL_COMMUNITY): Payer: Self-pay

## 2023-10-24 ENCOUNTER — Other Ambulatory Visit: Payer: Self-pay

## 2023-10-24 ENCOUNTER — Ambulatory Visit (INDEPENDENT_AMBULATORY_CARE_PROVIDER_SITE_OTHER): Admitting: Family Medicine

## 2023-10-24 ENCOUNTER — Other Ambulatory Visit: Payer: Self-pay | Admitting: Family Medicine

## 2023-10-24 ENCOUNTER — Encounter: Payer: Self-pay | Admitting: Family Medicine

## 2023-10-24 VITALS — BP 135/83 | HR 72 | Ht 65.0 in | Wt 273.6 lb

## 2023-10-24 DIAGNOSIS — R002 Palpitations: Secondary | ICD-10-CM | POA: Diagnosis not present

## 2023-10-24 DIAGNOSIS — Z09 Encounter for follow-up examination after completed treatment for conditions other than malignant neoplasm: Secondary | ICD-10-CM | POA: Diagnosis not present

## 2023-10-24 DIAGNOSIS — R000 Tachycardia, unspecified: Secondary | ICD-10-CM | POA: Diagnosis not present

## 2023-10-24 DIAGNOSIS — Z9884 Bariatric surgery status: Secondary | ICD-10-CM

## 2023-10-24 NOTE — Progress Notes (Signed)
 Established Patient Office Visit  Subjective    Patient ID: Pamela [name], female    DOB: 1970-07-23  Age: 53 y.o. MRN: [id number]  CC:  Chief Complaint  Patient presents with   Post-op Follow-up    Pt just released from [facility] after bariatric surgery     HPI Pamela N Vertz presents for hospital discharge follow up after recent bariatric surgery.  Patient complains of tachycardia and she has noted rate up to 132. She also has had intermittent palpitations.   Outpatient Encounter Medications as of 10/24/2023  Medication Sig   buPROPion  (WELLBUTRIN  SR) 200 MG 12 hr tablet Take 1 tablet (200 mg total) by mouth every morning.   enoxaparin  (LOVENOX ) 40 MG/0.4ML injection Inject 0.4 mLs (40 mg total) into the skin every 12 (twelve) hours for 14 days.   levocetirizine (XYZAL ) 5 MG tablet Take 5 mg by mouth every evening.   losartan -hydrochlorothiazide  (HYZAAR ) 100-25 MG tablet Take 1 tablet by mouth daily.   metFORMIN  (GLUCOPHAGE ) 500 MG tablet Take 1 tablet (500 mg total) by mouth daily with breakfast.   pantoprazole  (PROTONIX ) 40 MG tablet Take 1 tablet (40 mg total) by mouth daily.   albuterol  (VENTOLIN  HFA) 108 (90 Base) MCG/ACT inhaler Inhale 2 puffs into the lungs every 6 (six) hours as needed for wheezing or shortness of breath. (Patient not taking: Reported on 10/24/2023)   budesonide  (PULMICORT  FLEXHALER) 180 MCG/ACT inhaler With respiratory illness or flare up, start 1 puffs twice daily for 2 weeks. (Patient not taking: Reported on 10/24/2023)   fluticasone  (FLONASE ) 50 MCG/ACT nasal spray Place 2 sprays into both nostrils daily as needed for allergies or rhinitis. (Patient not taking: Reported on 10/24/2023)   gabapentin  (NEURONTIN ) 100 MG capsule Take 1 capsule (100 mg total) by mouth every 12 (twelve) hours for 5 days. (Patient not taking: Reported on 10/24/2023)   Misc Natural Products (DAILY HERBS IMMUNE DEFENSE PO) Take 1 tablet by mouth daily. Multi (Patient not  taking: Reported on 10/24/2023)   montelukast  (SINGULAIR ) 10 MG tablet Take 1 tablet (10 mg total) by mouth at bedtime. (Patient not taking: Reported on 10/24/2023)   ondansetron  (ZOFRAN -ODT) 4 MG disintegrating tablet Take 1 tablet (4 mg total) by mouth every 6 (six) hours as needed for nausea or vomiting. (Patient not taking: Reported on 10/24/2023)   oxyCODONE  (OXY IR/ROXICODONE ) 5 MG immediate release tablet Take 1 tablet (5 mg total) by mouth every 6 (six) hours as needed for breakthrough pain or severe pain (pain score 7-10). (Patient not taking: Reported on 10/24/2023)   topiramate  (TOPAMAX ) 50 MG tablet Take 1 tablet (50 mg total) by mouth 2 (two) times daily. (Patient not taking: Reported on 10/24/2023)   No facility-administered encounter medications on file as of 10/24/2023.    Past Medical History:  Diagnosis Date   ADHD    Anxiety    Arthritis    Asthma    Patient follows with [facility] . LOV 03/24/21 with FNP Arlean Mutter.As of 04/03/21, pt is using her inhaler 1 -2 times per day per pt.   Back pain    Constipation    COVID 03/09/2021   Patient took antiviral. As of 04/03/21, pt states that all symptoms are resolved.   Depression    GERD (gastroesophageal reflux disease)    Hypertension    IBS (irritable bowel syndrome)    Joint pain    Lower extremity edema    Obesity    Pre-diabetes  07/28/20 A1C 6.4 / Patient is taking Wegovy .   Sleep apnea    05/03/20 sleep study = moderate to severe sleep apnea. Wears CPAP.   Wears glasses     Past Surgical History:  Procedure Laterality Date   CESAREAN SECTION  1998   COLONOSCOPY  08/19/2020   LAPAROSCOPIC GASTRIC SLEEVE RESECTION N/A 10/15/2023   Procedure: GASTRECTOMY, SLEEVE, LAPAROSCOPIC;  Surgeon: Kinsinger, [name], MD;  Location: [facility];  Service: General;  Laterality: N/A;   OVARIAN CYST REMOVAL  1991   ROBOTIC ASSISTED LAPAROSCOPIC HYSTERECTOMY AND SALPINGECTOMY Bilateral 04/12/2021   Procedure:  XI ROBOTIC ASSISTED LAPAROSCOPIC HYSTERECTOMY AND BILATERAL SALPINGECTOMY;  Surgeon: Storm Setter, DO;  Location: [facility] [place];  Service: Gynecology;  Laterality: Bilateral;   TUBAL LIGATION  2008   UPPER GI ENDOSCOPY N/A 10/15/2023   Procedure: ENDOSCOPY, UPPER GI TRACT;  Surgeon: Kinsinger, [name], MD;  Location: [facility];  Service: General;  Laterality: N/A;   WISDOM TOOTH EXTRACTION  1986    Family History  Problem Relation Age of Onset   Asthma Other    Heart failure Other    Hypertension Other    Hypertension Mother    Hypertension Father    Heart disease Father    Alcoholism Father     Social History   Socioeconomic History   Marital status: Married    Spouse name: Not on file   Number of children: Not on file   Years of education: Not on file   Highest education level: Not on file  Occupational History   Occupation: Educator   Tobacco Use   Smoking status: Never    Passive exposure: Never   Smokeless tobacco: Never  Vaping Use   Vaping status: Never Used  Substance and Sexual Activity   Alcohol use: Not Currently    Comment: occasionally, once per month   Drug use: No   Sexual activity: Yes    Birth control/protection: Surgical    Comment: tubal ligation  Other Topics Concern   Not on file  Social History Narrative   Lives at home with husband   Right handed   Caffeine: 1 cup/day   Social Drivers of Corporate investment banker Strain: Low Risk  (01/16/2023)   Overall Financial Resource Strain (CARDIA)    Difficulty of Paying Living Expenses: Not hard at all  Food Insecurity: No Food Insecurity (10/15/2023)   Hunger Vital Sign    Worried About Running Out of Food in the Last Year: Never true    Ran Out of Food in the Last Year: Never true  Transportation Needs: No Transportation Needs (10/15/2023)   PRAPARE - Administrator, Civil Service (Medical): No    Lack of Transportation (Non-Medical): No  Physical Activity:  Insufficiently Active (01/16/2023)   Exercise Vital Sign    Days of Exercise per Week: 1 day    Minutes of Exercise per Session: 30 min  Stress: No Stress Concern Present (01/16/2023)   [employer] of Occupational Health - Occupational Stress Questionnaire    Feeling of Stress : Not at all  Social Connections: Socially Integrated (01/16/2023)   Social Connection and Isolation Panel    Frequency of Communication with Friends and Family: More than three times a week    Frequency of Social Gatherings with Friends and Family: More than three times a week    Attends Religious Services: More than 4 times per year    Active Member of [employer] or Organizations:  Yes    Attends Club or Organization Meetings: More than 4 times per year    Marital Status: Married  Catering manager Violence: Not At Risk (10/15/2023)   Humiliation, Afraid, Rape, and Kick questionnaire    Fear of Current or Ex-Partner: No    Emotionally Abused: No    Physically Abused: No    Sexually Abused: No    Review of Systems  Cardiovascular:  Positive for palpitations.  All other systems reviewed and are negative.       Objective    BP 135/83   Pulse 72   Ht 5' 5 (1.651 m)   Wt 273 lb 9.6 oz (124.1 kg)   LMP 03/16/2021 (Exact Date)   SpO2 97%   BMI 45.53 kg/m   Physical Exam Vitals and nursing note reviewed.  Constitutional:      General: She is not in acute distress. Cardiovascular:     Rate and Rhythm: Normal rate and regular rhythm.  Pulmonary:     Effort: Pulmonary effort is normal.     Breath sounds: Normal breath sounds.  Abdominal:     Palpations: Abdomen is soft.     Tenderness: There is no abdominal tenderness.  Neurological:     General: No focal deficit present.     Mental Status: She is alert and oriented to person, place, and time.         Assessment & Plan:   Palpitations -     Ambulatory referral to Cardiology  Tachycardia -     Ambulatory referral to Cardiology  History  of bariatric surgery  Hospital discharge follow-up     Return if symptoms worsen or fail to improve.   Tanda Raguel SQUIBB, MD

## 2023-10-28 ENCOUNTER — Encounter: Payer: Self-pay | Admitting: Family Medicine

## 2023-10-28 ENCOUNTER — Other Ambulatory Visit: Payer: Self-pay | Admitting: Family Medicine

## 2023-10-28 ENCOUNTER — Other Ambulatory Visit (HOSPITAL_COMMUNITY): Payer: Self-pay

## 2023-10-29 ENCOUNTER — Encounter: Payer: Self-pay | Admitting: Dietician

## 2023-10-29 ENCOUNTER — Other Ambulatory Visit (HOSPITAL_COMMUNITY): Payer: Self-pay

## 2023-10-29 ENCOUNTER — Encounter: Attending: General Surgery | Admitting: Dietician

## 2023-10-29 VITALS — Ht 65.0 in | Wt 270.3 lb

## 2023-10-29 DIAGNOSIS — E669 Obesity, unspecified: Secondary | ICD-10-CM | POA: Diagnosis present

## 2023-10-29 MED ORDER — LOSARTAN POTASSIUM-HCTZ 100-25 MG PO TABS
1.0000 | ORAL_TABLET | Freq: Every day | ORAL | 0 refills | Status: AC
  Filled 2023-10-29: qty 90, 90d supply, fill #0

## 2023-10-29 NOTE — Progress Notes (Signed)
 2 Week Post-Operative Nutrition Follow-up   Patient was seen on 10/29/2023 for Post-Operative Nutrition education at [facility]    Surgery date: 10/15/2023 Surgery type: Sleeve Gastrectomy  Anthropometrics  Start weight at [facility]: 289.8 lbs (date: 08/12/2023)  Height: 65 in Weight today: 270.3 lbs   Clinical  Medical hx: IBS, obesity, HTN, sleep apnea Medications: metformin , topiramate , montelukast  (for flare ups), pantoprazole , meloxicam  (for knee), flonase , bupropion , losartan  Labs: iron 124, iron saturation 57, vit B12 Notable signs/symptoms: pt states she is having heart palpitations, stating she is working with her PCP. Pt states she is getting plenty of fluids, 64-90 oz per day. Also states she is reaching her protein goals. Checked her blood sugar during visit: 94 mg/dL Any previous deficiencies? No Bowel Habits: Every day to every other day; some gas   Body Composition Scale 10/29/2023  Current Body Weight 270.3  Total Body Fat % 47.2  Visceral Fat 19  Fat-Free Mass % 52.7   Total Body Water  % 40.8  Muscle-Mass lbs 31.8  BMI 44.7  Body Fat Displacement          Torso  lbs 79.2         Left Leg  lbs 15.8         Right Leg  lbs 15.8         Left Arm  lbs 7.9         Right Arm  lbs 7.9    The following the learning objectives were met by the patient during this course: Identifies Soft Prepped Plan Advancement Guide  Identifies Soft, High Proteins (Phase 1), beginning 2 weeks post-operatively to 3 weeks post-operatively Identifies Additional Soft High Proteins, soft non-starchy vegetables, fruits and starches (Phase 2), beginning 3 weeks post-operatively to 3 months post-operatively Identifies appropriate sources of fluids, proteins, vegetables, fruits and starches Identifies appropriate fat sources and healthy verses unhealthy fat types   States protein, vegetable, fruit and starch recommendations and appropriate sources  post-operatively Identifies the need for appropriate texture modifications, mastication, and bite sizes when consuming solids Identifies appropriate fat consumption and sources Identifies appropriate multivitamin and calcium sources post-operatively Describes the need for physical activity post-operatively and will follow MD recommendations States when to call healthcare provider regarding medication questions or post-operative complications   Handouts given during class include: Soft Prepped Plan Advancement Guide   Follow-Up Plan: Patient will follow-up at [facility] in 10 weeks for 3 month post-op nutrition visit for diet advancement per MD.

## 2023-10-29 NOTE — Telephone Encounter (Signed)
 Requested Prescriptions  Pending Prescriptions Disp Refills   losartan -hydrochlorothiazide  (HYZAAR ) 100-25 MG tablet 90 tablet 0    Sig: Take 1 tablet by mouth daily.     Cardiovascular: ARB + Diuretic Combos Failed - 10/29/2023  7:36 AM      Failed - Cr in normal range and within 180 days    Creatinine, Ser  Date Value Ref Range Status  10/15/2023 1.01 (H) 0.44 - 1.00 mg/dL Final         Passed - K in normal range and within 180 days    Potassium  Date Value Ref Range Status  10/09/2023 3.7 3.5 - 5.1 mmol/L Final         Passed - Na in normal range and within 180 days    Sodium  Date Value Ref Range Status  10/09/2023 136 135 - 145 mmol/L Final  01/16/2023 136 134 - 144 mmol/L Final         Passed - eGFR is 10 or above and within 180 days    GFR calc Af Amer  Date Value Ref Range Status  03/15/2020 80 >59 mL/min/1.73 Final    Comment:    **In accordance with recommendations from the NKF-ASN Task force,**   [facility] is in the process of updating its eGFR calculation to the   2021 CKD-EPI creatinine equation that estimates kidney function   without a race variable.    GFR, Estimated  Date Value Ref Range Status  10/15/2023 >60 >60 mL/min Final    Comment:    (NOTE) Calculated using the CKD-EPI Creatinine Equation (2021) Performed at [facility], [address] [place], [place] [address]    eGFR  Date Value Ref Range Status  01/16/2023 70 >59 mL/min/1.73 Final         Passed - Patient is not pregnant      Passed - Last BP in normal range    BP Readings from Last 1 Encounters:  10/24/23 135/83         Passed - Valid encounter within last 6 months    Recent Outpatient Visits           5 days ago Palpitations   [place] [facility], MD   4 months ago Encounter for completion of form with patient   [place] [facility], MD   9 months ago Annual physical exam    [place] [facility], MD   1 year ago Annual physical exam   [place] [facility], MD   1 year ago Mild persistent asthma with acute exacerbation    [facility], MD

## 2023-11-07 ENCOUNTER — Telehealth: Payer: Self-pay | Admitting: Dietician

## 2023-11-07 NOTE — Telephone Encounter (Signed)
 RD called pt to verify fluid intake once starting soft, solid proteins 2 week post-bariatric surgery.   Daily Fluid intake:  Daily Protein intake:  Bowel Habits:   Concerns/issues:   Left Voice Message with call back number

## 2023-12-12 ENCOUNTER — Ambulatory Visit: Payer: Self-pay | Admitting: Family Medicine

## 2024-01-14 ENCOUNTER — Encounter: Attending: Surgery | Admitting: Dietician

## 2024-01-14 ENCOUNTER — Encounter: Payer: Self-pay | Admitting: Dietician

## 2024-01-14 ENCOUNTER — Other Ambulatory Visit (HOSPITAL_BASED_OUTPATIENT_CLINIC_OR_DEPARTMENT_OTHER): Payer: Self-pay

## 2024-01-14 ENCOUNTER — Encounter: Payer: Self-pay | Admitting: Family Medicine

## 2024-01-14 ENCOUNTER — Ambulatory Visit: Payer: Self-pay | Admitting: Family Medicine

## 2024-01-14 VITALS — BP 122/76 | HR 61 | Ht 65.0 in | Wt 250.2 lb

## 2024-01-14 VITALS — Ht 65.0 in | Wt 248.1 lb

## 2024-01-14 DIAGNOSIS — R7303 Prediabetes: Secondary | ICD-10-CM

## 2024-01-14 DIAGNOSIS — E669 Obesity, unspecified: Secondary | ICD-10-CM | POA: Insufficient documentation

## 2024-01-14 DIAGNOSIS — Z23 Encounter for immunization: Secondary | ICD-10-CM | POA: Diagnosis not present

## 2024-01-14 DIAGNOSIS — R002 Palpitations: Secondary | ICD-10-CM | POA: Diagnosis not present

## 2024-01-14 DIAGNOSIS — Z6841 Body Mass Index (BMI) 40.0 and over, adult: Secondary | ICD-10-CM | POA: Diagnosis not present

## 2024-01-14 MED ORDER — METFORMIN HCL 500 MG PO TABS
500.0000 mg | ORAL_TABLET | Freq: Every day | ORAL | 3 refills | Status: AC
  Filled 2024-01-14: qty 90, 90d supply, fill #0

## 2024-01-14 NOTE — Progress Notes (Signed)
 Bariatric Nutrition Follow-Up Visit Medical Nutrition Therapy  Appt Start Time: 0804   End Time: 0838  Surgery date: 10/15/2023 Surgery type: Sleeve Gastrectomy  NUTRITION ASSESSMENT  Anthropometrics  Start weight at [facility]: 289.8 lbs (date: 08/12/2023)  Height: 65 in Weight today: 248.1 lbs   Clinical  Medical hx: IBS, obesity, HTN, sleep apnea Medications: metformin , topiramate , montelukast  (for flare ups), pantoprazole , meloxicam  (for knee), flonase , bupropion , losartan  Labs: iron 124, iron saturation 57, vit B12 Notable signs/symptoms: pt states she is having heart palpitations, stating she is working with her PCP. Pt states she is getting plenty of fluids, 64-90 oz per day. Also states she is reaching her protein goals. Checked her blood sugar during visit: 94 mg/dL Any previous deficiencies? No Bowel Habits: Every day to every other day; some gas   Body Composition Scale 10/29/2023 01/14/2024  Current Body Weight 270.3 248.1  Total Body Fat % 47.2 45.2  Visceral Fat 19 16  Fat-Free Mass % 52.7 54.7   Total Body Water  % 40.8 41.8  Muscle-Mass lbs 31.8 31.6  BMI 44.7 40.9  Body Fat Displacement           Torso  lbs 79.2 69.6         Left Leg  lbs 15.8 13.9         Right Leg  lbs 15.8 13.9         Left Arm  lbs 7.9 6.9         Right Arm  lbs 7.9 6.9    Lifestyle & Dietary Hx Pt states she wants to zipline, stating she would need to be under 250 lbs.  Pt states she does Miralax every day. Pt states she has two protein shakes a day, stating they are her snacks.  Estimated daily fluid intake: 55-80 oz (weekends fluids are great Estimated daily protein intake: 60+ g Supplements: multivitamin and calcium Current average weekly physical activity: ADLs   24-Hr Dietary Recall First Meal: egg, applesauce or cooked vegetable Snack: protein shake and fruit  Second Meal: 3 oz of meat (grilled chicken nuggets or turkey meatballs, vegetables, fruit Snack: protein shake  Third  Meal: salmon (3 oz), vegetable (1 oz) Snack:  Beverages: water , Gatorade zero, decaf coffee, Bragg's Vinegar Tea  Post-Op Goals/ Signs/ Symptoms Using straws: no Drinking while eating: no Chewing/swallowing difficulties: no Changes in vision: no Changes to mood/headaches: no Hair loss/changes to skin/nails: no Difficulty focusing/concentrating: no Sweating: no Limb weakness: no Dizziness/lightheadedness: no Palpitations: no  Carbonated/caffeinated beverages: no N/V/D/C/Gas: no Abdominal pain: no Dumping syndrome: no   NUTRITION DIAGNOSIS  Overweight/obesity (Emp[place]-3.3) related to past poor dietary habits and physical inactivity as evidenced by completed bariatric surgery and following dietary guidelines for continued weight loss and healthy nutrition status.   NUTRITION INTERVENTION Nutrition counseling (C-1) and education (E-2) to facilitate bariatric surgery goals, including: Diet advancement to the standard prep plan The importance of consuming adequate calories as well as certain nutrients daily due to the body's need for essential vitamins, minerals, and fats The importance of daily physical activity and to reach a goal of at least 150 minutes of moderate to vigorous physical activity weekly (or as directed by their physician) due to benefits such as increased musculature and improved lab values The importance of intuitive eating specifically learning hunger-satiety cues and understanding the importance of learning a new body: The importance of mindful eating to avoid grazing behaviors   Handouts Provided Include  Standard Prep Plan Advancement Guide  Goals  Increase physical activity; check out the BELT program; aim for 30-60 minutes 3-4 days per week Track protein  Learning Style & Readiness for Change Teaching method utilized: Visual & Auditory  Demonstrated degree of understanding via: Teach Back  Readiness Level: ready Barriers to learning/adherence to lifestyle  change: nothing identified  RD's Notes for Next Visit Assess adherence to pt chosen goals  MONITORING & EVALUATION Dietary intake, weekly physical activity, body weight.  Next Steps Patient is to follow-up in 3 months for 6 month post-op follow-up.

## 2024-01-14 NOTE — Progress Notes (Unsigned)
 Established Patient Office Visit  Subjective    Patient ID: Pamela [name], female    DOB: 1970-10-11  Age: 53 y.o. MRN: [id number]  CC:  Chief Complaint  Patient presents with   Medical Management of Chronic Issues    Pt reports needing Dr. Tanda to take over prescribing her Metformin . Previously being prescribed by her weight loss dr.  Also would like flu shot     HPI Ruben N Tatsch presents ***  Outpatient Encounter Medications as of 01/14/2024  Medication Sig   losartan -hydrochlorothiazide  (HYZAAR ) 100-25 MG tablet Take 1 tablet by mouth daily.   [DISCONTINUED] metFORMIN  (GLUCOPHAGE ) 500 MG tablet Take 1 tablet (500 mg total) by mouth daily with breakfast.   albuterol  (VENTOLIN  HFA) 108 (90 Base) MCG/ACT inhaler Inhale 2 puffs into the lungs every 6 (six) hours as needed for wheezing or shortness of breath. (Patient not taking: Reported on 10/24/2023)   budesonide  (PULMICORT  FLEXHALER) 180 MCG/ACT inhaler With respiratory illness or flare up, start 1 puffs twice daily for 2 weeks. (Patient not taking: Reported on 10/24/2023)   buPROPion  (WELLBUTRIN  SR) 200 MG 12 hr tablet Take 1 tablet (200 mg total) by mouth every morning. (Patient not taking: Reported on 01/14/2024)   enoxaparin  (LOVENOX ) 40 MG/0.4ML injection Inject 0.4 mLs (40 mg total) into the skin every 12 (twelve) hours for 14 days. (Patient not taking: Reported on 01/14/2024)   fluticasone  (FLONASE ) 50 MCG/ACT nasal spray Place 2 sprays into both nostrils daily as needed for allergies or rhinitis. (Patient not taking: Reported on 10/24/2023)   gabapentin  (NEURONTIN ) 100 MG capsule Take 1 capsule (100 mg total) by mouth every 12 (twelve) hours for 5 days. (Patient not taking: Reported on 10/24/2023)   levocetirizine (XYZAL ) 5 MG tablet Take 5 mg by mouth every evening. (Patient not taking: Reported on 01/14/2024)   metFORMIN  (GLUCOPHAGE ) 500 MG tablet Take 1 tablet (500 mg total) by mouth daily with breakfast.   Misc  Natural Products (DAILY HERBS IMMUNE DEFENSE PO) Take 1 tablet by mouth daily. Multi (Patient not taking: Reported on 10/24/2023)   montelukast  (SINGULAIR ) 10 MG tablet Take 1 tablet (10 mg total) by mouth at bedtime. (Patient not taking: Reported on 10/24/2023)   ondansetron  (ZOFRAN -ODT) 4 MG disintegrating tablet Take 1 tablet (4 mg total) by mouth every 6 (six) hours as needed for nausea or vomiting. (Patient not taking: Reported on 10/24/2023)   oxyCODONE  (OXY IR/ROXICODONE ) 5 MG immediate release tablet Take 1 tablet (5 mg total) by mouth every 6 (six) hours as needed for breakthrough pain or severe pain (pain score 7-10). (Patient not taking: Reported on 10/24/2023)   pantoprazole  (PROTONIX ) 40 MG tablet Take 1 tablet (40 mg total) by mouth daily. (Patient not taking: Reported on 01/14/2024)   topiramate  (TOPAMAX ) 50 MG tablet Take 1 tablet (50 mg total) by mouth 2 (two) times daily. (Patient not taking: Reported on 01/14/2024)   No facility-administered encounter medications on file as of 01/14/2024.    Past Medical History:  Diagnosis Date   ADHD    Anxiety    Arthritis    Asthma    Patient follows with [facility] . LOV 03/24/21 with FNP Arlean Mutter.As of 04/03/21, pt is using her inhaler 1 -2 times per day per pt.   Back pain    Constipation    COVID 03/09/2021   Patient took antiviral. As of 04/03/21, pt states that all symptoms are resolved.   Depression  GERD (gastroesophageal reflux disease)    Hypertension    IBS (irritable bowel syndrome)    Joint pain    Lower extremity edema    Obesity    Pre-diabetes    07/28/20 A1C 6.4 / Patient is taking Wegovy .   Sleep apnea    05/03/20 sleep study = moderate to severe sleep apnea. Wears CPAP.   Wears glasses     Past Surgical History:  Procedure Laterality Date   CESAREAN SECTION  1998   COLONOSCOPY  08/19/2020   LAPAROSCOPIC GASTRIC SLEEVE RESECTION N/A 10/15/2023   Procedure: GASTRECTOMY, SLEEVE,  LAPAROSCOPIC;  Surgeon: Kinsinger, [name], MD;  Location: [facility];  Service: General;  Laterality: N/A;   OVARIAN CYST REMOVAL  1991   ROBOTIC ASSISTED LAPAROSCOPIC HYSTERECTOMY AND SALPINGECTOMY Bilateral 04/12/2021   Procedure: XI ROBOTIC ASSISTED LAPAROSCOPIC HYSTERECTOMY AND BILATERAL SALPINGECTOMY;  Surgeon: Storm Setter, DO;  Location: [facility];  Service: Gynecology;  Laterality: Bilateral;   TUBAL LIGATION  2008   UPPER GI ENDOSCOPY N/A 10/15/2023   Procedure: ENDOSCOPY, UPPER GI TRACT;  Surgeon: Kinsinger, [name], MD;  Location: [facility];  Service: General;  Laterality: N/A;   WISDOM TOOTH EXTRACTION  1986    Family History  Problem Relation Age of Onset   Asthma Other    Heart failure Other    Hypertension Other    Hypertension Mother    Hypertension Father    Heart disease Father    Alcoholism Father     Social History   Socioeconomic History   Marital status: Married    Spouse name: Not on file   Number of children: Not on file   Years of education: Not on file   Highest education level: Not on file  Occupational History   Occupation: Educator   Tobacco Use   Smoking status: Never    Passive exposure: Never   Smokeless tobacco: Never  Vaping Use   Vaping status: Never Used  Substance and Sexual Activity   Alcohol use: Not Currently    Comment: occasionally, once per month   Drug use: No   Sexual activity: Yes    Birth control/protection: Surgical    Comment: tubal ligation  Other Topics Concern   Not on file  Social History Narrative   Lives at home with husband   Right handed   Caffeine: 1 cup/day   Social Drivers of Corporate Investment Banker Strain: Low Risk  (01/16/2023)   Overall Financial Resource Strain (CARDIA)    Difficulty of Paying Living Expenses: Not hard at all  Food Insecurity: No Food Insecurity (10/15/2023)   Hunger Vital Sign    Worried About Running Out of Food in the Last Year: Never true    Ran Out of Food  in the Last Year: Never true  Transportation Needs: No Transportation Needs (10/15/2023)   PRAPARE - Administrator, Civil Service (Medical): No    Lack of Transportation (Non-Medical): No  Physical Activity: Insufficiently Active (01/16/2023)   Exercise Vital Sign    Days of Exercise per Week: 1 day    Minutes of Exercise per Session: 30 min  Stress: No Stress Concern Present (01/16/2023)   [employer] of Occupational Health - Occupational Stress Questionnaire    Feeling of Stress : Not at all  Social Connections: Socially Integrated (01/16/2023)   Social Connection and Isolation Panel    Frequency of Communication with Friends and Family: More than three times a week  Frequency of Social Gatherings with Friends and Family: More than three times a week    Attends Religious Services: More than 4 times per year    Active Member of [employer] or Organizations: Yes    Attends Engineer, Structural: More than 4 times per year    Marital Status: Married  Catering Manager Violence: Not At Risk (10/15/2023)   Humiliation, Afraid, Rape, and Kick questionnaire    Fear of Current or Ex-Partner: No    Emotionally Abused: No    Physically Abused: No    Sexually Abused: No    ROS      Objective    BP 122/76   Pulse 61   Ht 5' 5 (1.651 m)   Wt 250 lb 3.2 oz (113.5 kg)   LMP 03/16/2021 (Exact Date)   SpO2 97%   BMI 41.64 kg/m   Physical Exam  {Labs (Optional):23779}    Assessment & Plan:   Pre-diabetes -     metFORMIN  HCl; Take 1 tablet (500 mg total) by mouth daily with breakfast.  Dispense: 90 tablet; Refill: 3     No follow-ups on file.   Tanda Raguel SQUIBB, MD

## 2024-01-24 ENCOUNTER — Other Ambulatory Visit (HOSPITAL_BASED_OUTPATIENT_CLINIC_OR_DEPARTMENT_OTHER): Payer: Self-pay

## 2024-02-07 ENCOUNTER — Other Ambulatory Visit (HOSPITAL_BASED_OUTPATIENT_CLINIC_OR_DEPARTMENT_OTHER): Payer: Self-pay

## 2024-04-13 ENCOUNTER — Ambulatory Visit: Admitting: Family Medicine

## 2024-04-14 ENCOUNTER — Encounter
